# Patient Record
Sex: Female | Born: 1935 | Race: White | Hispanic: No | Smoking: Never smoker
Health system: Southern US, Community
[De-identification: ages and names within clinical notes are randomized; demographics above are authoritative.]

## PROBLEM LIST (undated history)

## (undated) DIAGNOSIS — M1990 Unspecified osteoarthritis, unspecified site: Secondary | ICD-10-CM

## (undated) DIAGNOSIS — E785 Hyperlipidemia, unspecified: Secondary | ICD-10-CM

## (undated) DIAGNOSIS — C801 Malignant (primary) neoplasm, unspecified: Secondary | ICD-10-CM

## (undated) DIAGNOSIS — I10 Essential (primary) hypertension: Secondary | ICD-10-CM

## (undated) HISTORY — PX: COLON SURGERY: SHX602

## (undated) HISTORY — DX: Hyperlipidemia, unspecified: E78.5

## (undated) HISTORY — PX: TOTAL HIP ARTHROPLASTY: SHX124

## (undated) HISTORY — DX: Essential (primary) hypertension: I10

## (undated) HISTORY — PX: EYE SURGERY: SHX253

---

## 1999-11-29 ENCOUNTER — Ambulatory Visit (HOSPITAL_BASED_OUTPATIENT_CLINIC_OR_DEPARTMENT_OTHER): Admission: RE | Admit: 1999-11-29 | Discharge: 1999-11-29 | Payer: Self-pay | Admitting: Plastic Surgery

## 2001-03-25 ENCOUNTER — Other Ambulatory Visit: Admission: RE | Admit: 2001-03-25 | Discharge: 2001-03-25 | Payer: Self-pay | Admitting: Family Medicine

## 2001-12-27 ENCOUNTER — Ambulatory Visit (HOSPITAL_COMMUNITY): Admission: RE | Admit: 2001-12-27 | Discharge: 2001-12-27 | Payer: Self-pay | Admitting: Gastroenterology

## 2004-09-26 ENCOUNTER — Other Ambulatory Visit: Admission: RE | Admit: 2004-09-26 | Discharge: 2004-09-26 | Payer: Self-pay | Admitting: Family Medicine

## 2010-05-07 ENCOUNTER — Other Ambulatory Visit: Payer: Self-pay | Admitting: Orthopedic Surgery

## 2010-05-07 ENCOUNTER — Other Ambulatory Visit (HOSPITAL_COMMUNITY): Payer: Self-pay | Admitting: Orthopedic Surgery

## 2010-05-07 ENCOUNTER — Encounter (HOSPITAL_COMMUNITY): Payer: Medicare Other

## 2010-05-07 ENCOUNTER — Ambulatory Visit (HOSPITAL_COMMUNITY)
Admission: RE | Admit: 2010-05-07 | Discharge: 2010-05-07 | Disposition: A | Discharge status: Home / Self Care | Payer: Medicare Other | Source: Ambulatory Visit | Attending: Orthopedic Surgery | Admitting: Orthopedic Surgery

## 2010-05-07 DIAGNOSIS — Z01810 Encounter for preprocedural cardiovascular examination: Secondary | ICD-10-CM | POA: Insufficient documentation

## 2010-05-07 DIAGNOSIS — Z01812 Encounter for preprocedural laboratory examination: Secondary | ICD-10-CM | POA: Insufficient documentation

## 2010-05-07 DIAGNOSIS — Z01811 Encounter for preprocedural respiratory examination: Secondary | ICD-10-CM

## 2010-05-07 DIAGNOSIS — Z01818 Encounter for other preprocedural examination: Secondary | ICD-10-CM

## 2010-05-07 DIAGNOSIS — M1610 Unilateral primary osteoarthritis, unspecified hip: Secondary | ICD-10-CM | POA: Insufficient documentation

## 2010-05-07 DIAGNOSIS — M169 Osteoarthritis of hip, unspecified: Secondary | ICD-10-CM | POA: Insufficient documentation

## 2010-05-07 LAB — URINALYSIS, ROUTINE W REFLEX MICROSCOPIC
Bilirubin Urine: NEGATIVE
Hgb urine dipstick: NEGATIVE
Specific Gravity, Urine: 1.02 (ref 1.005–1.030)
Urine Glucose, Fasting: NEGATIVE mg/dL
Urobilinogen, UA: 0.2 mg/dL (ref 0.0–1.0)
pH: 5 (ref 5.0–8.0)

## 2010-05-07 LAB — COMPREHENSIVE METABOLIC PANEL
ALT: 13 U/L (ref 0–35)
AST: 20 U/L (ref 0–37)
Albumin: 3.9 g/dL (ref 3.5–5.2)
Alkaline Phosphatase: 59 U/L (ref 39–117)
GFR calc Af Amer: >60 mL/min (ref >60)
Potassium: 4.4 mEq/L (ref 3.5–5.1)
Sodium: 140 mEq/L (ref 135–145)
Total Protein: 7.6 g/dL (ref 6.0–8.3)

## 2010-05-07 LAB — SURGICAL PCR SCREEN
MRSA, PCR: NEGATIVE
Staphylococcus aureus: POSITIVE — AB

## 2010-05-07 LAB — CBC
HCT: 39 % (ref 36.0–46.0)
MCHC: 31.8 g/dL (ref 30.0–36.0)
RDW: 12.3 % (ref 11.5–15.5)

## 2010-05-07 LAB — URINE MICROSCOPIC-ADD ON

## 2010-05-07 LAB — PROTIME-INR: INR: 0.95 (ref 0.00–1.49)

## 2010-05-15 ENCOUNTER — Inpatient Hospital Stay (HOSPITAL_COMMUNITY): Payer: Medicare Other

## 2010-05-15 ENCOUNTER — Inpatient Hospital Stay (HOSPITAL_COMMUNITY)
Admission: RE | Admit: 2010-05-15 | Discharge: 2010-05-18 | DRG: 470 | Disposition: A | Discharge status: Skilled Nursing Facility | Payer: Medicare Other | Source: Ambulatory Visit | Attending: Orthopedic Surgery | Admitting: Orthopedic Surgery

## 2010-05-15 DIAGNOSIS — M169 Osteoarthritis of hip, unspecified: Principal | ICD-10-CM | POA: Diagnosis present

## 2010-05-15 DIAGNOSIS — I10 Essential (primary) hypertension: Secondary | ICD-10-CM | POA: Diagnosis present

## 2010-05-15 DIAGNOSIS — Z79899 Other long term (current) drug therapy: Secondary | ICD-10-CM

## 2010-05-15 DIAGNOSIS — D62 Acute posthemorrhagic anemia: Secondary | ICD-10-CM | POA: Diagnosis not present

## 2010-05-15 DIAGNOSIS — M1610 Unilateral primary osteoarthritis, unspecified hip: Principal | ICD-10-CM | POA: Diagnosis present

## 2010-05-15 LAB — TYPE AND SCREEN
ABO/RH(D): A POS
Antibody Screen: NEGATIVE

## 2010-05-15 LAB — ABO/RH: ABO/RH(D): A POS

## 2010-05-16 LAB — CBC
MCH: 28.8 pg (ref 26.0–34.0)
MCHC: 31.6 g/dL (ref 30.0–36.0)
Platelets: 230 10*3/uL (ref 150–400)
RDW: 12.5 % (ref 11.5–15.5)

## 2010-05-16 LAB — BASIC METABOLIC PANEL
BUN: 9 mg/dL (ref 6–23)
Calcium: 8.7 mg/dL (ref 8.4–10.5)
GFR calc non Af Amer: >60 mL/min (ref >60)
Glucose, Bld: 134 mg/dL — ABNORMAL HIGH (ref 70–99)

## 2010-05-16 LAB — PROTIME-INR: Prothrombin Time: 15.2 seconds (ref 11.6–15.2)

## 2010-05-16 NOTE — H&P (Addendum)
[name], [name]              ACCOUNT NO.:  [id number]  MEDICAL RECORD NO.:  [id number]           PATIENT TYPE:  I  LOCATION:  1540                         FACILITY:  [facility]  PHYSICIAN:  Ollen Gross, M.D.    DATE OF BIRTH:  Mar 20, 1924  DATE OF ADMISSION:  05/15/2010 DATE OF DISCHARGE:  05/07/2010                             HISTORY & PHYSICAL   CHIEF COMPLAINT:  Right hip pain.  BRIEF HISTORY:  [name] has been followed by Dr. Lequita Halt for worsening pain in her right hip.  She was most recently seen in December and unfortunately at that time her pain and dysfunction seemed to be worsening.  The hip pain and dysfunction continued to limit her.  She now presents for a right total hip arthroplasty.  MEDICATION ALLERGIES:  She has sensitivity to ERYTHROMYCIN, this causes nausea and vomiting.  PRIMARY CARE PHYSICIAN:  C. Duane Lope, MD, and she has been cleared for surgery by him.  CURRENT MEDICATIONS: 1. Benicar. 2. Lipitor.  1. Glucosamine. 2. Centrum Silver. 3. Calcium plus vitamin D.  At the time of the History and Physical, she is on antibiotics from her tooth extraction, but it will be finished before her surgery.  PAST MEDICAL HISTORY: 1. End-stage arthritis of the right hip. 2. Hypertension. 3. Arthritis.  PAST SURGICAL HISTORY:  None.  FAMILY HISTORY:  Father passed at the age of 69, natural causes.  Mother passed at age of 1, natural causes.  SOCIAL HISTORY:  The patient is married.  She denies past or present use of tobacco products.  She has 2 children.  She lives alone.  She would like to go to skilled nursing facility following her hospital stay.  She thinks that she most likely would go to [facility].  We will need to arrange a social work consult to arrange everything for her.  REVIEW OF SYSTEMS:  GENERAL:  Negative for fevers, chills, or weight change.  HEENT/NEUROLOGIC:  Negative for headache or blurred vision. DERMATOLOGIC:  Positive for  occasional rashes, none currently. RESPIRATORY:  Negative for shortness of breath at rest or with exertion. CARDIOVASCULAR:  Negative for chest pain or palpitations. GASTROINTESTINAL:  Negative for nausea, vomiting, or diarrhea. GENITOURINARY:  Negative for hematuria, dysuria.  MUSCULOSKELETAL: Positive for joint pain and morning stiffness.  PHYSICAL EXAMINATION:  VITAL SIGNS:  Pulse 80, respirations 18, blood pressure 122/76 in the right arm. GENERAL:  [name] is alert and oriented x3.  She is well developed, well nourished in no apparent distress.  She is a pleasant 77 year old female.  She is at stated height of 5 feet 4 inches and a stated weight of 139 pounds. HEENT:  Normocephalic, atraumatic.  Extraocular movements intact. NECK:  Supple, full range of motion without lymphadenopathy. CHEST:  Lungs are clear to auscultation bilaterally without wheezes, rhonchi, or rales. HEART:  Regular rate and rhythm. ABDOMEN:  Bowel sounds present in all 4 quadrants. EXTREMITIES:  Evaluation of the right hip, it is able to be flexed to about 100 degrees, minimal internal rotation, 20 degrees of external rotation, and only 20-30 degrees of abduction. SKIN:  Unremarkable. NEUROLOGIC:  Intact. PERIPHERAL VASCULAR:  Carotid pulses 2+ bilaterally without bruit.  DIAGNOSTIC STUDIES:  Radiographs of the right hip reveal advanced end- stage arthritis.  She has bone-on-bone changes.  IMPRESSION:  End-stage arthritis of the right hip.  PLAN:  Right total hip arthroplasty to be performed by Dr. Lequita Halt.     Rozell Searing, PAC   ______________________________ Ollen Gross, M.D.    LD/MEDQ  D:  05/15/2010  T:  05/15/2010  Job:  [id number]  Electronically Signed by Rozell Searing  on 05/16/2010 07:38:49 AM Electronically Signed by Ollen Gross M.D. on 05/29/2010 08:17:08 AM

## 2010-05-17 LAB — BASIC METABOLIC PANEL
Calcium: 8.6 mg/dL (ref 8.4–10.5)
Creatinine, Ser: 0.65 mg/dL (ref 0.4–1.2)
GFR calc Af Amer: >60 mL/min (ref >60)
GFR calc non Af Amer: >60 mL/min (ref >60)
Glucose, Bld: 156 mg/dL — ABNORMAL HIGH (ref 70–99)
Sodium: 135 mEq/L (ref 135–145)

## 2010-05-17 LAB — CBC
MCH: 29.5 pg (ref 26.0–34.0)
MCHC: 32.3 g/dL (ref 30.0–36.0)
RDW: 12.5 % (ref 11.5–15.5)

## 2010-05-17 LAB — PROTIME-INR: INR: 1.31 (ref 0.00–1.49)

## 2010-05-18 LAB — CBC
MCHC: 32.1 g/dL (ref 30.0–36.0)
MCV: 90 fL (ref 78.0–100.0)
Platelets: 203 10*3/uL (ref 150–400)
RDW: 12.4 % (ref 11.5–15.5)
WBC: 8.5 10*3/uL (ref 4.0–10.5)

## 2010-05-29 NOTE — Discharge Summary (Signed)
NAMESAMARIA, ANES              ACCOUNT NO.:  [id number]  MEDICAL RECORD NO.:  [id number]           PATIENT TYPE:  I  LOCATION:  1540                         FACILITY:  [facility]  PHYSICIAN:  Ollen Gross, M.D.    DATE OF BIRTH:  31-Oct-1924  DATE OF ADMISSION:  05/15/2010 DATE OF DISCHARGE:                        DISCHARGE SUMMARY - REFERRING   TENTATIVE DATE OF DISCHARGE:  Tomorrow, May 18, 2010.  ADMITTING DIAGNOSES: 1. Osteoarthritis right hip. 2. Hypertension.  DISCHARGE DIAGNOSES: 1. Osteoarthritis right hip status post right total hip replacement     arthroplasty. 2. Postoperative acute blood loss anemia, did not require transfusion. 3. Hypertension.  PROCEDURE:  May 15, 2010 right total hip.  Surgeon; Dr. Lequita Halt. Assistant; Avel Peace PA-C.  Anesthesia; general.  Blood loss approximately 200 cc.  CONSULTS:  None.  BRIEF HISTORY:  [name] is an 77 year old female who has been seen by Dr. Lequita Halt for ongoing progressive right hip pain.  She has known end- stage arthritis, thought to be a good candidate for surgical intervention.  Risks, benefits have been discussed.  She elected to proceed with surgery.  LABORATORY DATA:  Preop CBC showed hemoglobin 12.4, hematocrit 39.0, white cell count 8.4, platelets 314.  PT/INR 12.9 and 0.9, however, the PTT of 28.  Preop Chem panel on admission all within normal limits. Preop UA did show some small leukocyte esterase, rare squamous, 3 to 6 white cells, rare bacteria, otherwise negative.  Blood group type A+. Nasal swabs were positive for staph aureus, but negative for MRSA. Serial CBCs were followed.  Hemoglobin dropped down to 9.8 on postop day #1.  On postop day #2, her hemoglobin and hematocrit was 9.2 and 28.5. One more hemoglobin and hematocrit scheduled for postop day #3.  Serial pro-times followed per Coumadin protocol.  INR was 1.18 on postop day #1 and 1.31 on postop day #2.  PT and INR pending on date of  discharge. Serial BMETs were followed for 48 hours.  Electrolytes remained all within normal limits.  X-RAYS:  Portable pelvis and right hip films on postop shows right total hip replacement without complication.  EKG dated May 07, 2010 normal sinus rhythm, normal EKG confirmed by Dr. Hillis Range.  HOSPITAL COURSE:  The patient was admitted to the [facility], taken to the OR, underwent the said procedure without complication.  The patient tolerated the procedure well, later transferred to the recovery room in orthopedic floor, started on p.o. and IV analgesic pain control following surgery.  Actually doing pretty well in the morning of day #1. Hemoglobin is down to 9.8.  She is asymptomatic with this.  We did put her on iron supplementation.  Hemovac drain placed on the surgery, pulled without difficulty.  Started getting up out of bed, doing some ambulation with therapy.  She is allowed to be partial weightbearing 25% to 50% to the right leg.  She is walking a few steps.  She did want to look into skilled nurse facility [facility], so we get social work involved.  By day #2, she was doing better.  Hemoglobin was down little bit to 9.2, but she  is asymptomatic with this.  She continued to work with physical therapy at that point as long as there was a bed available.  We were planning on transferring her over to [facility] on the next day on Saturday.  We will get social work involved in making arrangements for paperwork and as long as she was doing well, medically stable.  When the bed is available, she would be transferred out tomorrow on May 18, 2010.  DISCHARGE/PLAN: 1. Tentative plan for transfer tomorrow, May 18, 2010. 2. Discharge diagnoses, please see above. 3. Discharge medications.  CURRENT MEDICATIONS:  At the time of transfer include: 1. Coumadin protocol.  Please titrate the Coumadin level for a target     INR between 2.0 and 3.0.  She needs to be  on Coumadin for 3 weeks     from the date of surgery. 2. Colace 100 mg p.o. b.i.d. 3. Lipitor 10 mg p.o. daily 4. Aspirin 81 mg p.o. daily. 5. Norvasc 5 mg p.o. daily. 6. Benicar HCT 40/25 mg p.o. q.a.m. 7. Nu-Iron 150 mg p.o. daily for 3 weeks then discontinue the Nu-Iron. 8. Tylenol 325 mg one or two every 4 to 6 hours as needed for mild     pain, temperature, or headache. 9. Robaxin 500 mg p.o. q.6-8 h. p.r.n. spasm. 10.Laxative of choice. 11.Enema of choice. 12.Percocet 5 mg one or two every 4 to 6 hours as needed for moderate     pain.  DIET:  Heart-healthy diet.  ACTIVITY:  She is partial weightbearing 25% to 50% to the right lower extremity, hip precautions, total hip protocol.  PT and OT for gait training, ambulation, ADLs, range of motion, and strengthening exercises.  She may start showering, however, do not submerge incision under water.  FOLLOWUP:  She needs to follow up in 2 weeks following her surgery. Please contact the office at [phone] and arrange appointment and followup care.  DISPOSITION:  Planning for [facility] on Saturday.  CONDITION ON DISCHARGE:  Improving at the time of dictation, will be transferred on Saturday May 18, 2010 and while she is doing well.     Alexzandrew L. Julien Girt, P.A.C.   ______________________________ Ollen Gross, M.D.    ALP/MEDQ  D:  05/17/2010  T:  05/17/2010  Job:  [id number]  cc:   Dorma Russell He already retired in 2004  Electronically Signed by Patrica Duel P.A.C. on 05/21/2010 10:25:55 AM Electronically Signed by Ollen Gross M.D. on 05/29/2010 08:17:03 AM

## 2010-05-29 NOTE — Op Note (Signed)
NAME:  [name]              ACCOUNT NO.:  [id number]  MEDICAL RECORD NO.:  [id number]           PATIENT TYPE:  I  LOCATION:  0008                         FACILITY:  [facility]  PHYSICIAN:  Ollen Gross, M.D.    DATE OF BIRTH:  [birth year]  DATE OF PROCEDURE:  05/15/2010 DATE OF DISCHARGE:                              OPERATIVE REPORT   PREOPERATIVE DIAGNOSIS:  Osteoarthritis, right hip.  POSTOPERATIVE DIAGNOSIS:  Osteoarthritis, right hip.  PROCEDURE:  Right total hip arthroplasty.  SURGEON:  Ollen Gross, MD  ASSISTANT:  Alexzandrew L. Julien Girt, PA-C  ANESTHESIA:  General.  ESTIMATED BLOOD LOSS:  200.Marland Kitchen  DRAIN:  Hemovac x1.  COMPLICATIONS:  None.  CONDITION:  Stable to Recovery.  BRIEF CLINICAL NOTE:  [name] is an 77 year old female with advanced end-stage arthritis of the right hip with progressively worsening pain and dysfunction.  She has failed nonoperative management and presents for total hip arthroplasty.  PROCEDURE IN DETAIL:  After successful administration of general anesthetic, the patient was placed in left lateral decubitus position with the right side up and held with a hip positioner.  Right lower extremity was isolated from its perineum with plastic drapes and prepped and draped in a usual sterile fashion.  Short posterolateral incision was made with a 10 blade through subcutaneous tissue to the level of fascia lata which was incised in line with the skin incision.  The sciatic nerve was palpated and protected and short rotators and capsule were isolated off the femur.  Hip was dislocated and the center of femoral head was marked.  The trial prosthesis was placed such at that the center of the trial head corresponds to the center of the native femoral head.  Osteotomy line was marked on the femoral neck and osteotomy made with an oscillating saw.  Femoral head was removed. Femoral retractors were placed to gain access to the canal.  Starter  reamer was passed into the femoral canal.  The canal was then thoroughly irrigated to remove fatty contents.  The axial reaming was then performed to 13.5 mm, proximal reaming to an 65F and the sleeve machined to a small.  An 65F small trial sleeve was then placed.  The femur was retracted anteriorly to gain acetabular exposure. Acetabular retractors were then placed.  The acetabular labrum and osteophytes were removed.  Acetabular reaming was performed up to 51 mm for placement of a 52-mm Pinnacle acetabular shell.  The shell was placed in anatomic position and transfixed with 2 dome screws.  The apex hole eliminator was placed and then the permanent 32 mm neutral +4 Marathon liner was placed.  The trial stem was placed which was an 18 x 13 with a 36 +8 neck matching native anteversion.  The 32 +0 femoral head was placed.  It reduced so easily, so we went to a +2 which had more appropriate soft- tissue tension.  Stability was fantastic with full extension, full external rotation, 70 degrees flexion, 40 degrees adduction, 90 degrees internal rotation, 90 degrees of flexion, and 70 degrees of internal rotation.  By placing the right leg on top of the left,  it was felt as though leg lengths were equal.  Hip was then dislocated and trials were removed.  The permanent 44F small sleeve was placed and the permanent 18 x 13 stem with a 36 +8 neck matching native anteversion was placed.  The 32 +3 head was placed and then the hip was reduced with the same stability parameters.  The wound was then copiously irrigated with saline solution and short rotators and capsule reattached to the femur through drill holes with Ethibond suture.  Fascia lata was closed over Hemovac drain with interrupted #1 Vicryl, subcutaneous closed with #1-0 and #2-0 Vicryl, and then subcuticular running 4-0 Monocryl.  The catheter for Marcaine pain pump was placed and the pump was initiated. Incision was cleaned and  dried and Steri-Strips and a bulky sterile dressing were applied.  She was then placed into a knee immobilizer, awakened, and transported to Recovery in stable condition.     Ollen Gross, M.D.     FA/MEDQ  D:  05/15/2010  T:  05/15/2010  Job:  [id number]  Electronically Signed by Ollen Gross M.D. on 05/29/2010 08:17:06 AM

## 2013-02-23 ENCOUNTER — Encounter: Payer: Self-pay | Admitting: Podiatry

## 2013-02-23 ENCOUNTER — Ambulatory Visit (INDEPENDENT_AMBULATORY_CARE_PROVIDER_SITE_OTHER): Payer: Medicare Other

## 2013-02-23 ENCOUNTER — Ambulatory Visit (INDEPENDENT_AMBULATORY_CARE_PROVIDER_SITE_OTHER): Payer: Medicare Other | Admitting: Podiatry

## 2013-02-23 VITALS — BP 156/76 | HR 92 | Resp 16 | Ht 64.0 in | Wt 149.0 lb

## 2013-02-23 DIAGNOSIS — M722 Plantar fascial fibromatosis: Secondary | ICD-10-CM

## 2013-02-23 MED ORDER — TRIAMCINOLONE ACETONIDE 10 MG/ML IJ SUSP
10.0000 mg | Freq: Once | INTRAMUSCULAR | Status: AC
  Administered 2013-02-23: 10 mg

## 2013-02-23 NOTE — Patient Instructions (Signed)

## 2013-02-23 NOTE — Progress Notes (Signed)
   Subjective:    Patient ID: [name], female    DOB: 03-22-1924, 79 y.o.   MRN: [id number]  HPI Comments: "My heel hurts"  Pt c/o of plantar/medial heel pain left for several mos. Feels like a stone bruise. No AM pain just mostly after walking a lot or standing. She has not tried any self treatment other than rest.     Review of Systems  All other systems reviewed and are negative.       Objective:   Physical Exam        Assessment & Plan:

## 2013-02-24 NOTE — Progress Notes (Signed)
Subjective:     Patient ID: [name], female   DOB: 25-Dec-1924, 79 y.o.   MRN: [id number]  Foot Pain   patient presents stating I am having a lot of pain in my left heel of several months duration. I've tried reduced activity and shoe modifications without relief   Review of Systems  All other systems reviewed and are negative.       Objective:   Physical Exam  Constitutional: She is oriented to person, place, and time.  Cardiovascular: Intact distal pulses.   Musculoskeletal: Normal range of motion.  Neurological: She is oriented to person, place, and time.  Skin: Skin is warm.   neurovascular status intact with muscle strength adequate and no equinus condition noted both feet. Patient is found to have pain to palpation with edema of the left plantar fascia at the insertion of the tendon into the calcaneus     Assessment:     Plantar fasciitis left with inflammation and fluid buildup    Plan:     H&P performed and today I injected the left plantar fascia 3 mg Kenalog 5 of Xylocaine Marcaine mixture dispensed fascial brace and instructed on physical therapy

## 2013-03-09 ENCOUNTER — Ambulatory Visit (INDEPENDENT_AMBULATORY_CARE_PROVIDER_SITE_OTHER): Payer: Medicare Other | Admitting: Podiatry

## 2013-03-09 ENCOUNTER — Encounter: Payer: Self-pay | Admitting: Podiatry

## 2013-03-09 VITALS — BP 128/72 | HR 92 | Resp 16

## 2013-03-09 DIAGNOSIS — M722 Plantar fascial fibromatosis: Secondary | ICD-10-CM

## 2013-03-09 MED ORDER — TRIAMCINOLONE ACETONIDE 10 MG/ML IJ SUSP
10.0000 mg | Freq: Once | INTRAMUSCULAR | Status: AC
  Administered 2013-03-09: 10 mg

## 2013-03-11 NOTE — Progress Notes (Signed)
Subjective:     Patient ID: [name], female   DOB: 07-Jan-1925, 80 y.o.   MRN: [id number]  HPI patient states I'm feeling quite a bit better but still having pain in the bottom of my left heel with ambulation   Review of Systems     Objective:   Physical Exam Neurovascular status intact with no health history changes noted and discomfort still noted upon deep palpation plantar heel left    Assessment:     Plantar fasciitis improving left heel with pain still present    Plan:     Instructed on continued physical therapy reduced activity and fascial brace usage. Reinjected the plantar fascia 3 mg Kenalog 5 mg Xylocaine Marcaine mixture at this time and reappoint as needed

## 2013-07-06 ENCOUNTER — Emergency Department (HOSPITAL_COMMUNITY): Payer: Medicare Other

## 2013-07-06 ENCOUNTER — Encounter (HOSPITAL_COMMUNITY): Payer: Self-pay | Admitting: Emergency Medicine

## 2013-07-06 ENCOUNTER — Inpatient Hospital Stay (HOSPITAL_COMMUNITY)
Admission: EM | Admit: 2013-07-06 | Discharge: 2013-07-15 | DRG: 330 | Disposition: A | Discharge status: Home Health | Payer: Medicare Other | Attending: Internal Medicine | Admitting: Internal Medicine

## 2013-07-06 DIAGNOSIS — R110 Nausea: Secondary | ICD-10-CM | POA: Diagnosis present

## 2013-07-06 DIAGNOSIS — E46 Unspecified protein-calorie malnutrition: Secondary | ICD-10-CM | POA: Diagnosis present

## 2013-07-06 DIAGNOSIS — E785 Hyperlipidemia, unspecified: Secondary | ICD-10-CM | POA: Diagnosis present

## 2013-07-06 DIAGNOSIS — R112 Nausea with vomiting, unspecified: Secondary | ICD-10-CM

## 2013-07-06 DIAGNOSIS — C182 Malignant neoplasm of ascending colon: Secondary | ICD-10-CM | POA: Diagnosis present

## 2013-07-06 DIAGNOSIS — K6389 Other specified diseases of intestine: Secondary | ICD-10-CM

## 2013-07-06 DIAGNOSIS — I10 Essential (primary) hypertension: Secondary | ICD-10-CM | POA: Diagnosis present

## 2013-07-06 DIAGNOSIS — Z79899 Other long term (current) drug therapy: Secondary | ICD-10-CM

## 2013-07-06 DIAGNOSIS — K56609 Unspecified intestinal obstruction, unspecified as to partial versus complete obstruction: Secondary | ICD-10-CM

## 2013-07-06 DIAGNOSIS — Z932 Ileostomy status: Secondary | ICD-10-CM

## 2013-07-06 DIAGNOSIS — C189 Malignant neoplasm of colon, unspecified: Principal | ICD-10-CM | POA: Diagnosis present

## 2013-07-06 DIAGNOSIS — Z96649 Presence of unspecified artificial hip joint: Secondary | ICD-10-CM

## 2013-07-06 DIAGNOSIS — Z7982 Long term (current) use of aspirin: Secondary | ICD-10-CM

## 2013-07-06 DIAGNOSIS — Z6825 Body mass index (BMI) 25.0-25.9, adult: Secondary | ICD-10-CM

## 2013-07-06 LAB — URINALYSIS, ROUTINE W REFLEX MICROSCOPIC
Bilirubin Urine: NEGATIVE
Glucose, UA: NEGATIVE mg/dL
Hgb urine dipstick: NEGATIVE
Ketones, ur: NEGATIVE mg/dL
Nitrite: NEGATIVE
Protein, ur: NEGATIVE mg/dL
Specific Gravity, Urine: 1.022 (ref 1.005–1.030)
Urobilinogen, UA: 0.2 mg/dL (ref 0.0–1.0)
pH: 6 (ref 5.0–8.0)

## 2013-07-06 LAB — COMPREHENSIVE METABOLIC PANEL
ALK PHOS: 71 U/L (ref 39–117)
ALT: 11 U/L (ref 0–35)
AST: 16 U/L (ref 0–37)
Albumin: 3.6 g/dL (ref 3.5–5.2)
BILIRUBIN TOTAL: 0.3 mg/dL (ref 0.3–1.2)
BUN: 19 mg/dL (ref 6–23)
CHLORIDE: 96 meq/L (ref 96–112)
CO2: 25 meq/L (ref 19–32)
Calcium: 10.1 mg/dL (ref 8.4–10.5)
Creatinine, Ser: 0.85 mg/dL (ref 0.50–1.10)
GFR calc non Af Amer: 60 mL/min — ABNORMAL LOW (ref >90)
GFR, EST AFRICAN AMERICAN: 69 mL/min — AB (ref >90)
GLUCOSE: 141 mg/dL — AB (ref 70–99)
POTASSIUM: 3.4 meq/L — AB (ref 3.7–5.3)
Sodium: 136 mEq/L — ABNORMAL LOW (ref 137–147)
Total Protein: 7.4 g/dL (ref 6.0–8.3)

## 2013-07-06 LAB — CBC WITH DIFFERENTIAL/PLATELET
Basophils Absolute: 0 K/uL (ref 0.0–0.1)
Basophils Relative: 0 % (ref 0–1)
Eosinophils Absolute: 0.1 K/uL (ref 0.0–0.7)
Eosinophils Relative: 1 % (ref 0–5)
HCT: 37.1 % (ref 36.0–46.0)
Hemoglobin: 12.2 g/dL (ref 12.0–15.0)
Lymphocytes Relative: 13 % (ref 12–46)
Lymphs Abs: 1.4 K/uL (ref 0.7–4.0)
MCH: 26 pg (ref 26.0–34.0)
MCHC: 32.9 g/dL (ref 30.0–36.0)
MCV: 79.1 fL (ref 78.0–100.0)
Monocytes Absolute: 0.6 K/uL (ref 0.1–1.0)
Monocytes Relative: 6 % (ref 3–12)
Neutro Abs: 8.4 K/uL — ABNORMAL HIGH (ref 1.7–7.7)
Neutrophils Relative %: 80 % — ABNORMAL HIGH (ref 43–77)
Platelets: 318 K/uL (ref 150–400)
RBC: 4.69 MIL/uL (ref 3.87–5.11)
RDW: 15.5 % (ref 11.5–15.5)
WBC: 10.5 K/uL (ref 4.0–10.5)

## 2013-07-06 LAB — CREATININE, SERUM
Creatinine, Ser: 0.82 mg/dL (ref 0.50–1.10)
GFR calc Af Amer: 72 mL/min — ABNORMAL LOW (ref >90)
GFR calc non Af Amer: 63 mL/min — ABNORMAL LOW (ref >90)

## 2013-07-06 LAB — CBC
HCT: 35.5 % — ABNORMAL LOW (ref 36.0–46.0)
HEMOGLOBIN: 11.7 g/dL — AB (ref 12.0–15.0)
MCH: 26.2 pg (ref 26.0–34.0)
MCHC: 33 g/dL (ref 30.0–36.0)
MCV: 79.6 fL (ref 78.0–100.0)
PLATELETS: 323 10*3/uL (ref 150–400)
RBC: 4.46 MIL/uL (ref 3.87–5.11)
RDW: 15.6 % — ABNORMAL HIGH (ref 11.5–15.5)
WBC: 9.6 10*3/uL (ref 4.0–10.5)

## 2013-07-06 LAB — POC OCCULT BLOOD, ED: Fecal Occult Bld: NEGATIVE

## 2013-07-06 LAB — URINE MICROSCOPIC-ADD ON

## 2013-07-06 LAB — MAGNESIUM: MAGNESIUM: 1.9 mg/dL (ref 1.5–2.5)

## 2013-07-06 LAB — LIPASE, BLOOD: Lipase: 17 U/L (ref 11–59)

## 2013-07-06 LAB — PHOSPHORUS: Phosphorus: 4.1 mg/dL (ref 2.3–4.6)

## 2013-07-06 MED ORDER — ONDANSETRON HCL 4 MG PO TABS: 4.0000 mg | ORAL_TABLET | Freq: Four times a day (QID) | ORAL | Status: DC | PRN

## 2013-07-06 MED ORDER — LIDOCAINE VISCOUS 2 % MT SOLN
15.0000 mL | Freq: Once | OROMUCOSAL | Status: AC
  Administered 2013-07-06: 15 mL via OROMUCOSAL
  Filled 2013-07-06: qty 15

## 2013-07-06 MED ORDER — IOHEXOL 300 MG/ML  SOLN
50.0000 mL | Freq: Once | INTRAMUSCULAR | Status: AC | PRN
  Administered 2013-07-06: 50 mL via ORAL

## 2013-07-06 MED ORDER — KCL IN DEXTROSE-NACL 20-5-0.45 MEQ/L-%-% IV SOLN
INTRAVENOUS | Status: DC
  Administered 2013-07-06 – 2013-07-11 (×7): via INTRAVENOUS
  Filled 2013-07-06 (×15): qty 1000

## 2013-07-06 MED ORDER — IOHEXOL 300 MG/ML  SOLN
100.0000 mL | Freq: Once | INTRAMUSCULAR | Status: AC | PRN
  Administered 2013-07-06: 100 mL via INTRAVENOUS

## 2013-07-06 MED ORDER — HEPARIN SODIUM (PORCINE) 5000 UNIT/ML IJ SOLN
5000.0000 [IU] | Freq: Three times a day (TID) | INTRAMUSCULAR | Status: DC
  Administered 2013-07-06 – 2013-07-08 (×6): 5000 [IU] via SUBCUTANEOUS
  Filled 2013-07-06 (×8): qty 1

## 2013-07-06 MED ORDER — ONDANSETRON HCL 4 MG/2ML IJ SOLN
4.0000 mg | Freq: Four times a day (QID) | INTRAMUSCULAR | Status: DC | PRN
  Administered 2013-07-06: 4 mg via INTRAVENOUS
  Filled 2013-07-06 (×3): qty 2

## 2013-07-06 MED ORDER — ONDANSETRON HCL 4 MG/2ML IJ SOLN
4.0000 mg | Freq: Once | INTRAMUSCULAR | Status: AC
  Administered 2013-07-06: 4 mg via INTRAVENOUS
  Filled 2013-07-06: qty 2

## 2013-07-06 MED ORDER — SODIUM CHLORIDE 0.9 % IV SOLN
INTRAVENOUS | Status: DC
Start: 2013-07-06 — End: 2013-07-06

## 2013-07-06 MED ORDER — MORPHINE SULFATE 2 MG/ML IJ SOLN
1.0000 mg | INTRAMUSCULAR | Status: DC | PRN
  Administered 2013-07-06 – 2013-07-07 (×4): 1 mg via INTRAVENOUS
  Filled 2013-07-06 (×4): qty 1

## 2013-07-06 MED ORDER — SODIUM CHLORIDE 0.9 % IV SOLN
INTRAVENOUS | Status: DC
  Administered 2013-07-06: 12:00:00 via INTRAVENOUS

## 2013-07-06 MED ORDER — MENTHOL 3 MG MT LOZG
1.0000 | LOZENGE | OROMUCOSAL | Status: DC | PRN
  Filled 2013-07-06: qty 9

## 2013-07-06 MED ORDER — SODIUM CHLORIDE 0.9 % IV BOLUS (SEPSIS)
1000.0000 mL | Freq: Once | INTRAVENOUS | Status: AC
  Administered 2013-07-06: 1000 mL via INTRAVENOUS

## 2013-07-06 NOTE — Consult Note (Signed)
Subjective:   HPI  The patient is an 80 year old female who has been experiencing generalized abdominal cramping discomfort over the past 2 or 3 weeks. She has had some loose stool but denies rectal bleeding. She came to the emergency room with complaints of abdominal pain and a CT scan of the abdomen was done which shows thickening of the bowel wall at the hepatic flexure of the colon worrisome for malignancy and this is causing bowel obstruction. An NG tube has been placed. We are asked to see her in regards to colonoscopy.  Review of Systems No chest pain or shortness of breath  Past Medical History  Diagnosis Date  . Hypertension   . Hyperlipidemia    Past Surgical History  Procedure Laterality Date  . Total hip arthroplasty     History   Social History  . Marital Status: Married    Spouse Name: N/A    Number of Children: N/A  . Years of Education: N/A   Occupational History  . Not on file.   Social History Main Topics  . Smoking status: Never Smoker   . Smokeless tobacco: Not on file  . Alcohol Use: No  . Drug Use: No  . Sexual Activity: Not on file   Other Topics Concern  . Not on file   Social History Narrative  . No narrative on file   family history is not on file. Current facility-administered medications:dextrose 5 % and 0.45 % NaCl with KCl 20 mEq/L infusion, , Intravenous, Continuous, Velvet Bathe, MD, Last Rate: 100 mL/hr at 07/06/13 1235;  heparin injection 5,000 Units, 5,000 Units, Subcutaneous, 3 times per day, Velvet Bathe, MD;  menthol-cetylpyridinium (CEPACOL) lozenge 3 mg, 1 lozenge, Oral, PRN, Velvet Bathe, MD;  morphine 2 MG/ML injection 1 mg, 1 mg, Intravenous, Q3H PRN, Velvet Bathe, MD ondansetron (ZOFRAN) injection 4 mg, 4 mg, Intravenous, Q6H PRN, Velvet Bathe, MD;  ondansetron (ZOFRAN) tablet 4 mg, 4 mg, Oral, Q6H PRN, Velvet Bathe, MD No Known Allergies   Objective:     BP 152/75  Pulse 84  Temp(Src) 98.3 F (36.8 C) (Oral)  Resp 16   Ht 5\' 4"  (1.626 m)  Wt 67.631 kg (149 lb 1.6 oz)  BMI 25.58 kg/m2  SpO2 98%  She is in no distress  Heart regular rhythm no murmurs  Lungs clear  Abdomen: Bowel sounds are present, the abdomen is somewhat distended and tympanitic. It is soft with minimal discomfort to palpation diffusely. No obvious masses are felt.  Laboratory No components found with this basename: d1      Assessment:     #1. Bowel obstruction  #2. Abnormal CT scan with probable mass at the hepatic flexure      Plan:     NG tube suction for bowel obstruction. Reassess the situation tomorrow. We will plan colonoscopy in the next couple of days. We will not be able to prep her from above with an oral prep given the bowel obstruction but will have to try enemas to clean out her colon as best we can. She is going to need surgery for the bowel obstruction at the hepatic flexure. We will look for any synchronous lesions or more distal colon polyps.    Component Value Date/Time   WBC 9.6 07/06/2013 1305   HGB 11.7* 07/06/2013 1305   HCT 35.5* 07/06/2013 1305   PLT 323 07/06/2013 1305   ALT 11 07/06/2013 0806   AST 16 07/06/2013 0806   NA 136* 07/06/2013 0806  K 3.4* 07/06/2013 0806   CL 96 07/06/2013 0806   CREATININE 0.82 07/06/2013 1305   BUN 19 07/06/2013 0806   CO2 25 07/06/2013 0806   CALCIUM 10.1 07/06/2013 0806   PHOS 4.1 07/06/2013 1305   ALKPHOS 71 07/06/2013 0806

## 2013-07-06 NOTE — Care Management Note (Signed)
CARE MANAGEMENT NOTE 07/06/2013  Patient:  [name], [name]   Account Number:  [id number]  Date Initiated:  07/06/2013  Documentation initiated by:  Emil Weigold  Subjective/Objective Assessment:   80 yo female admitted with a colonic mass.     Action/Plan:   Discharged when stable   Anticipated DC Date:     Anticipated DC Plan:  [place]  CM consult      Choice offered to / List presented to:  NA   DME arranged  NA      DME agency  NA     [place] arranged  NA      [place] agency  NA   Status of service:  In process, will continue to follow Medicare Important Message given?   (If response is "NO", the following Medicare IM given date fields will be blank) Date Medicare IM given:   Date Additional Medicare IM given:    Discharge Disposition:    Per UR Regulation:  Reviewed for med. necessity/level of care/duration of stay  If discussed at [place] of Stay Meetings, dates discussed:    Comments:  07/06/13 [place] Chart reviewed for utilization of services. No needs assessed at this time. Will continue to follow.

## 2013-07-06 NOTE — Consult Note (Signed)
General Surgery [facility]  Patient seen and examined.  CT scan reviewed and discussed with patient and family at bedside.  Agree with NG decompression, cleansing enemas from below.  Await GI evaluation.  Patient will need resection to relieve obstruction.  Discussed one stage versus possible ostomy with patient and family.  Discussed timing of surgery - probably Friday (5/1) or Saturday (5/2).  Will follow with you.  Earnstine Regal, MD, [facility] Office: [phone]

## 2013-07-06 NOTE — ED Notes (Signed)
Patient transported to CT 

## 2013-07-06 NOTE — ED Provider Notes (Signed)
CSN: [id number]     Arrival date & time 07/06/13  2951 History   First MD Initiated Contact with Patient 07/06/13 [phone]     Chief Complaint  Patient presents with  . Abdominal Pain  . Nausea     (Consider location/radiation/quality/duration/timing/severity/associated sxs/prior Treatment) Patient is a 80 y.o. female presenting with abdominal pain.  Abdominal Pain  This is an 80 year old female who comes in today complaining of diffuse abdominal pain. She states she has had her symptoms 2-3 weeks. It is a crampy pain that comes and goes. She cannot identify inciting factors or relieving factors. She states that the pain began again last night and was worse through the night. She has had associated vomiting for the past several days. She describes the emesis as brown liquid. She had some diarrhea with the last episode on Saturday. She states she has had very little solid intake since that time. She was seen at [facility] walk in clinic over the weekend and was diagnosed urinary tract infection and started on Cipro which she is still taking. She denies fever, chills, sick contacts, chest pain, urinary tract infection symptoms, or leg pain or swelling. She has not had any recent hospitalizations.   Past Medical History  Diagnosis Date  . Hypertension   . Hyperlipidemia    Past Surgical History  Procedure Laterality Date  . Total hip arthroplasty     No family history on file. History  Substance Use Topics  . Smoking status: Never Smoker   . Smokeless tobacco: Not on file  . Alcohol Use: No   OB History   Grav Para Term Preterm Abortions TAB SAB Ect Mult Living                 Review of Systems  Gastrointestinal: Positive for abdominal pain.      Allergies  Review of patient's allergies indicates no known allergies.  Home Medications   Prior to Admission medications   Medication Sig Start Date End Date Taking? Authorizing Provider  amLODipine (NORVASC) 5 MG tablet  02/01/13    Historical Provider, MD  aspirin 81 MG tablet Take 81 mg by mouth daily.    Historical Provider, MD  atorvastatin (LIPITOR) 10 MG tablet  01/26/13   Historical Provider, MD  losartan-hydrochlorothiazide (HYZAAR) 100-25 MG per tablet  02/16/13   Historical Provider, MD   BP 147/81  Pulse 96  Temp(Src) 97.6 F (36.4 C) (Oral)  Resp 20  Ht 5\' 4"  (1.626 m)  Wt 150 lb (68.04 kg)  BMI 25.73 kg/m2  SpO2 100% Physical Exam  Nursing note and vitals reviewed. Constitutional: She is oriented to person, place, and time. She appears well-developed and well-nourished.  HENT:  Head: Normocephalic and atraumatic.  Right Ear: External ear normal.  Left Ear: External ear normal.  Nose: Nose normal.  Mucous membranes are slightly dry  Eyes: Conjunctivae and EOM are normal. Pupils are equal, round, and reactive to light.  Neck: Normal range of motion. Neck supple. No JVD present. No tracheal deviation present. No thyromegaly present.  Cardiovascular: Normal rate, regular rhythm, normal heart sounds and intact distal pulses.   Pulmonary/Chest: Effort normal and breath sounds normal. No respiratory distress. She has no wheezes.  Abdominal: Soft. Bowel sounds are normal. She exhibits distension. She exhibits no mass. There is tenderness. There is guarding. There is no rebound.  Abdomen reveals guarding on exam with mild diffuse tenderness to palpation and appears slightly distended no rebound or masses are noted  Genitourinary:  Rectal exam performed no masses noted some small amount of stool palpable in the rectal vault.  Musculoskeletal: Normal range of motion.  Lymphadenopathy:    She has no cervical adenopathy.  Neurological: She is alert and oriented to person, place, and time. She has normal reflexes. She displays normal reflexes. No cranial nerve deficit or sensory deficit. She exhibits normal muscle tone. Coordination and gait normal. GCS eye subscore is 4. GCS verbal subscore is 5. GCS motor  subscore is 6.  Reflex Scores:      Bicep reflexes are 2+ on the right side and 2+ on the left side.      Patellar reflexes are 2+ on the right side and 2+ on the left side. Strength is 5/5 bilateral elbow flexor/extensors, wrist extension/flexion, intrinsic hand strength equal Bilateral hip flexion/extension 5/5, knee flexion/extension 5/5, ankle 5/5 flexion extension    Skin: Skin is warm and dry.  Psychiatric: She has a normal mood and affect. Her behavior is normal. Judgment and thought content normal.    ED Course  Procedures (including critical care time) Labs Review Labs Reviewed  CBC WITH DIFFERENTIAL  COMPREHENSIVE METABOLIC PANEL  LIPASE, BLOOD  URINALYSIS, ROUTINE W REFLEX MICROSCOPIC    Imaging Review Ct Abdomen Pelvis W Contrast  07/06/2013   CLINICAL DATA:  Intermittent generalized abdominal pain for 2 weeks.  EXAM: CT ABDOMEN AND PELVIS WITH CONTRAST  TECHNIQUE: Multidetector CT imaging of the abdomen and pelvis was performed using the standard protocol following bolus administration of intravenous contrast.  CONTRAST:  69mL OMNIPAQUE IOHEXOL 300 MG/ML SOLN, 173mL OMNIPAQUE IOHEXOL 300 MG/ML SOLN  COMPARISON:  None.  FINDINGS: There is a 4 mm nodule in the right middle lobe. 4 mm nodule is also present in the right lower lobe. Subpleural opacities in the lung bases may reflect scarring, atelectasis, or possibly mild fibrosis. There is no pleural effusion.  The liver is diffusely low in attenuation, compatible with mild to moderate steatosis. Punctate calcifications in the liver and spleen likely reflect prior granulomatous infection. The gallbladder, adrenal glands, and pancreas have an unremarkable enhanced appearance. Small bilateral renal cyst and bilateral extrarenal pelves are noted.  There is a small hiatal hernia. There is a focal area of wall thickening involving the hepatic flexure of the colon measuring approximately 3 cm in length (series 2, image 34). The colon  distal to this is decompressed. The more proximal colon is fluid-filled and dilated. There is also dilatation of multiple mid and distal small bowel loops, which are fluid-filled and measure up to 3.7 cm in diameter.  Moderate atherosclerotic aortic calcification is present. Uterus and ovaries are identified. No pelvic mass is seen. Bladder is unremarkable. No free fluid or enlarged lymph nodes are identified. Prior right total hip arthroplasty is noted. Multilevel degenerative disc disease is present in the lower thoracic and lumbar spine. Left-greater-than-right facet arthrosis is present in the lower lumbar spine, greatest at L4-5.  IMPRESSION: 1. Focal wall thickening in the hepatic flexure of the colon with resultant bowel obstruction. Findings are concerning for primary colonic malignancy. 2. 4 mm nodules in the right lung base, indeterminate. 3. Hepatic steatosis. 4. Small hiatal hernia. These results were called by telephone at the time of interpretation on 07/06/2013 at 9:43 AM to Dr. Pattricia Boss , who verbally acknowledged these results.   Electronically Signed   By: Logan Bores   On: 07/06/2013 09:45  I have reviewed the report and personally reviewed the above radiology studies.  EKG Interpretation None      MDM  26 female with abdominal pain for several weeks now unable to eat or drink without pain and vomiting with CT that shows wall thickening in the hepatic flexure concerning for colonic malignancy  With bowel obstruction. Patient is not actively vomiting and pain is not severe currently. She is made n.p.o. and do not currently plan to place an NG. General surgery is consulted.  NG tube placed per surgery recommendation.   Discussed with Dr. Wendee Beavers and plan admission to med surgery bed.  Final diagnoses:  Colonic mass  Colonic obstruction  Nausea and vomiting        Shaune Pollack, MD 07/06/13 1047

## 2013-07-06 NOTE — Consult Note (Signed)
[name] May 26, 1924  [id number].   Primary Care MD: Dr. Melinda Crutch Requesting MD: Dr. Pattricia Boss Chief Complaint/Reason for Consult: colonic obstruction HPI: This is an 80 yo otherwise relatively healthy white female who developed "gripey" crampy abdominal pain about 2-3 weeks ago.  She has been unable to eat for this time as well.  She passed some liquid stool on Saturday but nothing since that time.  She denies any change in caliber of her stool or hematochezia.  She has started have nausea and vomiting the last 2-3 days as well.  She denies fevers, chills, weight loss, gain, night sweats, or family history of GI malignancy.  She last had a colonoscopy 8 years ago by Dr. Amedeo Plenty.  She states this was normal with no evidence of any concerns or even polyps.  She presented to the [facility] today due to continued symptoms.  She had a CT scan that revealed thickening at her hepatic flexure with resultant bowel obstruction, likely a colonic malignancy.  We have been asked to evaluate the patient for further recommendations.  ROS : Please see HPI, otherwise all other systems are currently negative  History reviewed. No pertinent family history.  Past Medical History  Diagnosis Date  . Hypertension   . Hyperlipidemia     Past Surgical History  Procedure Laterality Date  . Total hip arthroplasty      Social History:  reports that she has never smoked. She does not have any smokeless tobacco history on file. She reports that she does not drink alcohol or use illicit drugs.  Allergies: No Known Allergies   (Not in a hospital admission)  Blood pressure 142/70, pulse 92, temperature 98.1 F (36.7 C), temperature source Oral, resp. rate 13, height _0  (1.626 m), weight 150 lb (68.04 kg), SpO2 96.00%. Physical Exam: General: pleasant, WD, WN white female who is laying in bed in NAD HEENT: head is normocephalic, atraumatic.  Sclera are noninjected.  PERRL.  Ears and nose without any masses or  lesions.  Mouth is pink and moist Heart: regular, rate, and rhythm.  Normal s1,s2. No obvious murmurs, gallops, or rubs noted.  Palpable radial and pedal pulses bilaterally Lungs: CTAB, no wheezes, rhonchi, or rales noted.  Respiratory effort nonlabored Abd: soft, diffusely tender, but greatest on right side.  No rebounding or guarding, distended, +BS, no masses, hernias, or organomegaly MS: all 4 extremities are symmetrical with no cyanosis, clubbing, or edema. Skin: warm and dry with no masses, lesions, or rashes Psych: A&Ox3 with an appropriate affect.    Results for orders placed during the hospital encounter of 07/06/13 (from the past 48 hour(s))  POC OCCULT BLOOD, ED     Status: None   Collection Time    07/06/13  7:47 AM      Result Value Ref Range   Fecal Occult Bld NEGATIVE  NEGATIVE  CBC WITH DIFFERENTIAL     Status: Abnormal   Collection Time    07/06/13  8:06 AM      Result Value Ref Range   WBC 10.5  4.0 - 10.5 K/uL   RBC 4.69  3.87 - 5.11 MIL/uL   Hemoglobin 12.2  12.0 - 15.0 g/dL   HCT 37.1  36.0 - 46.0 %   MCV 79.1  78.0 - 100.0 fL   MCH 26.0  26.0 - 34.0 pg   MCHC 32.9  30.0 - 36.0 g/dL   RDW 15.5  11.5 - 15.5 %   Platelets 318  150 -  400 K/uL   Neutrophils Relative % 80 (*) 43 - 77 %   Neutro Abs 8.4 (*) 1.7 - 7.7 K/uL   Lymphocytes Relative 13  12 - 46 %   Lymphs Abs 1.4  0.7 - 4.0 K/uL   Monocytes Relative 6  3 - 12 %   Monocytes Absolute 0.6  0.1 - 1.0 K/uL   Eosinophils Relative 1  0 - 5 %   Eosinophils Absolute 0.1  0.0 - 0.7 K/uL   Basophils Relative 0  0 - 1 %   Basophils Absolute 0.0  0.0 - 0.1 K/uL  COMPREHENSIVE METABOLIC PANEL     Status: Abnormal   Collection Time    07/06/13  8:06 AM      Result Value Ref Range   Sodium 136 (*) 137 - 147 mEq/L   Potassium 3.4 (*) 3.7 - 5.3 mEq/L   Chloride 96  96 - 112 mEq/L   CO2 25  19 - 32 mEq/L   Glucose, Bld 141 (*) 70 - 99 mg/dL   BUN 19  6 - 23 mg/dL   Creatinine, Ser 0.85  0.50 - 1.10 mg/dL    Calcium 10.1  8.4 - 10.5 mg/dL   Total Protein 7.4  6.0 - 8.3 g/dL   Albumin 3.6  3.5 - 5.2 g/dL   AST 16  0 - 37 U/L   ALT 11  0 - 35 U/L   Alkaline Phosphatase 71  39 - 117 U/L   Total Bilirubin 0.3  0.3 - 1.2 mg/dL   GFR calc non Af Amer 60 (*) >90 mL/min   GFR calc Af Amer 69 (*) >90 mL/min   Comment: (NOTE)     The eGFR has been calculated using the CKD EPI equation.     This calculation has not been validated in all clinical situations.     eGFR's persistently <90 mL/min signify possible Chronic Kidney     Disease.  LIPASE, BLOOD     Status: None   Collection Time    07/06/13  8:06 AM      Result Value Ref Range   Lipase 17  11 - 59 U/L  URINALYSIS, ROUTINE W REFLEX MICROSCOPIC     Status: Abnormal   Collection Time    07/06/13  9:39 AM      Result Value Ref Range   Color, Urine YELLOW  YELLOW   APPearance CLEAR  CLEAR   Specific Gravity, Urine 1.022  1.005 - 1.030   pH 6.0  5.0 - 8.0   Glucose, UA NEGATIVE  NEGATIVE mg/dL   Hgb urine dipstick NEGATIVE  NEGATIVE   Bilirubin Urine NEGATIVE  NEGATIVE   Ketones, ur NEGATIVE  NEGATIVE mg/dL   Protein, ur NEGATIVE  NEGATIVE mg/dL   Urobilinogen, UA 0.2  0.0 - 1.0 mg/dL   Nitrite NEGATIVE  NEGATIVE   Leukocytes, UA TRACE (*) NEGATIVE  URINE MICROSCOPIC-ADD ON     Status: Abnormal   Collection Time    07/06/13  9:39 AM      Result Value Ref Range   Squamous Epithelial / LPF FEW (*) RARE   WBC, UA 0-2  <3 WBC/hpf   RBC / HPF 0-2  <3 RBC/hpf   Bacteria, UA FEW (*) RARE   Urine-Other MUCOUS PRESENT     Ct Abdomen Pelvis W Contrast  07/06/2013   CLINICAL DATA:  Intermittent generalized abdominal pain for 2 weeks.  EXAM: CT ABDOMEN AND PELVIS WITH CONTRAST  TECHNIQUE: Multidetector  CT imaging of the abdomen and pelvis was performed using the standard protocol following bolus administration of intravenous contrast.  CONTRAST:  2m OMNIPAQUE IOHEXOL 300 MG/ML SOLN, 1016mOMNIPAQUE IOHEXOL 300 MG/ML SOLN  COMPARISON:  None.   FINDINGS: There is a 4 mm nodule in the right middle lobe. 4 mm nodule is also present in the right lower lobe. Subpleural opacities in the lung bases may reflect scarring, atelectasis, or possibly mild fibrosis. There is no pleural effusion.  The liver is diffusely low in attenuation, compatible with mild to moderate steatosis. Punctate calcifications in the liver and spleen likely reflect prior granulomatous infection. The gallbladder, adrenal glands, and pancreas have an unremarkable enhanced appearance. Small bilateral renal cyst and bilateral extrarenal pelves are noted.  There is a small hiatal hernia. There is a focal area of wall thickening involving the hepatic flexure of the colon measuring approximately 3 cm in length (series 2, image 34). The colon distal to this is decompressed. The more proximal colon is fluid-filled and dilated. There is also dilatation of multiple mid and distal small bowel loops, which are fluid-filled and measure up to 3.7 cm in diameter.  Moderate atherosclerotic aortic calcification is present. Uterus and ovaries are identified. No pelvic mass is seen. Bladder is unremarkable. No free fluid or enlarged lymph nodes are identified. Prior right total hip arthroplasty is noted. Multilevel degenerative disc disease is present in the lower thoracic and lumbar spine. Left-greater-than-right facet arthrosis is present in the lower lumbar spine, greatest at L4-5.  IMPRESSION: 1. Focal wall thickening in the hepatic flexure of the colon with resultant bowel obstruction. Findings are concerning for primary colonic malignancy. 2. 4 mm nodules in the right lung base, indeterminate. 3. Hepatic steatosis. 4. Small hiatal hernia. These results were called by telephone at the time of interpretation on 07/06/2013 at 9:43 AM to Dr. DAPattricia Boss who verbally acknowledged these results.   Electronically Signed   By: AlLogan Bores On: 07/06/2013 09:45   Dg Chest Port 1 View  07/06/2013   CLINICAL  DATA:  Mid upper back pain for 1-2 weeks  EXAM: PORTABLE CHEST - 1 VIEW  COMPARISON:  None.  FINDINGS: The heart size and mediastinal contours are within normal limits. Both lungs are clear. Bilateral glenohumeral osteoarthritis.  IMPRESSION: No active disease.   Electronically Signed   By: HeKathreen Devoid On: 07/06/2013 10:41       Assessment/Plan 1. Colonic obstruction, likely malignant source 2. N/V secondary to #1 3. HTN 4. Hyperlipidemia  Plan: 1. Agree with medical admission.  I have already spoken to Dr. GaPenelope Coopith EaSadie HaberI about evaluating the patient for a colonoscopy.  She will need surgery no matter what, but we want to make sure there are no other findings or synchronous lesions in the remaining section of her colon.  Once this is complete, we can look at moving forward with surgical intervention. 2. Insert NGT for decompression 3. Check a CEA level and a prealbumin. 4. Given no general food intake for 2-3 weeks, she is likely somewhat malnourished.  Would likely go ahead and consider PICC placement and TNA given it will be over a week before she can eat again. 5. I have discussed CT scan findings with the patient and her son and daughter.  They all understand and agree with current plan of care.   KeHenreitta Cea/29/2015, 11:33 AM Pager: [phone]

## 2013-07-06 NOTE — H&P (Signed)
[facility] History and Physical  [name] ZOX:[id number] DOB: 22-Jul-1924 DOA: 07/06/2013  Referring physician: Dr. Jeanell Sparrow PCP: Melinda Crutch   Chief Complaint: Abdominal discomfort with new colonic mass  HPI: [name] is a 80 y.o. female  With history of hypertension. Who presents to the ED complaining of abdominal discomfort for 2 weeks duration. Nothing she is aware of makes it better. Eating food makes the pain worse. Patient has had decreased appetite and increase in nausea as well as vomiting. While in the emergency department had CT scan which showed new colonic mass. General surgery was consulted and recommended medical admission. The patient states the pain is over her abdomen. Since onset has been persistent and gradually getting worse.   Review of Systems:  Constitutional:  No weight loss, night sweats, Fevers, chills, fatigue.  HEENT:  No headaches, Difficulty swallowing,Tooth/dental problems,Sore throat,  No sneezing, itching, ear ache, nasal congestion, post nasal drip,  Cardio-vascular:  No chest pain, Orthopnea, PND, swelling in lower extremities, anasarca, dizziness, palpitations  GI:  No heartburn, indigestion, + abdominal pain, + nausea, + vomiting, diarrhea, change in bowel habits,  Resp:  No shortness of breath with exertion or at rest. No excess mucus, no productive cough, No non-productive cough, No coughing up of blood.No change in color of mucus.No wheezing.No chest wall deformity  Skin:  no rash or lesions.  GU:  no dysuria, change in color of urine, no urgency or frequency. No flank pain.  Musculoskeletal:  No joint pain or swelling. No decreased range of motion. No back pain.  Psych:  No change in mood or affect. No depression or anxiety. No memory loss.   Past Medical History  Diagnosis Date  . Hypertension   . Hyperlipidemia    Past Surgical History  Procedure Laterality Date  . Total hip arthroplasty     Social History:  reports  that she has never smoked. She does not have any smokeless tobacco history on file. She reports that she does not drink alcohol or use illicit drugs.  No Known Allergies  History reviewed. No pertinent family history.   Prior to Admission medications   Medication Sig Start Date End Date Taking? Authorizing Provider  amLODipine (NORVASC) 5 MG tablet Take 5 mg by mouth daily.  02/01/13  Yes Historical Provider, MD  aspirin 81 MG tablet Take 81 mg by mouth daily.   Yes Historical Provider, MD  atorvastatin (LIPITOR) 10 MG tablet Take 10 mg by mouth daily.  01/26/13  Yes Historical Provider, MD  ciprofloxacin (CIPRO) 250 MG tablet Take 250 mg by mouth 2 (two) times daily.   Yes Historical Provider, MD  losartan-hydrochlorothiazide (HYZAAR) 100-25 MG per tablet Take 1 tablet by mouth daily.  02/16/13  Yes Historical Provider, MD   Physical Exam: Filed Vitals:   07/06/13 1209  BP: 152/75  Pulse: 84  Temp: 98.3 F (36.8 C)  Resp: 16    BP 152/75  Pulse 84  Temp(Src) 98.3 F (36.8 C) (Oral)  Resp 16  Ht 5\' 4"  (1.626 m)  Wt 67.631 kg (149 lb 1.6 oz)  BMI 25.58 kg/m2  SpO2 98%  General:  Appears calm and comfortable Eyes: PERRL, normal lids, irises & conjunctiva ENT: grossly normal hearing, lips & tongue Neck: no LAD, masses or thyromegaly Cardiovascular: RRR, no m/r/g. No LE edema. Telemetry: SR, no arrhythmias  Respiratory: CTA bilaterally, no w/r/r. Normal respiratory effort. Abdomen: Hypoactive bowel sounds, soft, positive mild distention Skin: no rash or induration seen on limited  exam Musculoskeletal: grossly normal tone BUE/BLE Psychiatric: grossly normal mood and affect, speech fluent and appropriate Neurologic: grossly non-focal.          Labs on Admission:  Basic Metabolic Panel:  Recent Labs Lab 07/06/13 0806  NA 136*  K 3.4*  CL 96  CO2 25  GLUCOSE 141*  BUN 19  CREATININE 0.85  CALCIUM 10.1   Liver Function Tests:  Recent Labs Lab 07/06/13 0806    AST 16  ALT 11  ALKPHOS 71  BILITOT 0.3  PROT 7.4  ALBUMIN 3.6    Recent Labs Lab 07/06/13 0806  LIPASE 17   No results found for this basename: AMMONIA,  in the last 168 hours CBC:  Recent Labs Lab 07/06/13 0806  WBC 10.5  NEUTROABS 8.4*  HGB 12.2  HCT 37.1  MCV 79.1  PLT 318   Cardiac Enzymes: No results found for this basename: CKTOTAL, CKMB, CKMBINDEX, TROPONINI,  in the last 168 hours  BNP (last 3 results) No results found for this basename: PROBNP,  in the last 8760 hours CBG: No results found for this basename: GLUCAP,  in the last 168 hours  Radiological Exams on Admission: Ct Abdomen Pelvis W Contrast  07/06/2013   CLINICAL DATA:  Intermittent generalized abdominal pain for 2 weeks.  EXAM: CT ABDOMEN AND PELVIS WITH CONTRAST  TECHNIQUE: Multidetector CT imaging of the abdomen and pelvis was performed using the standard protocol following bolus administration of intravenous contrast.  CONTRAST:  80mL OMNIPAQUE IOHEXOL 300 MG/ML SOLN, 152mL OMNIPAQUE IOHEXOL 300 MG/ML SOLN  COMPARISON:  None.  FINDINGS: There is a 4 mm nodule in the right middle lobe. 4 mm nodule is also present in the right lower lobe. Subpleural opacities in the lung bases may reflect scarring, atelectasis, or possibly mild fibrosis. There is no pleural effusion.  The liver is diffusely low in attenuation, compatible with mild to moderate steatosis. Punctate calcifications in the liver and spleen likely reflect prior granulomatous infection. The gallbladder, adrenal glands, and pancreas have an unremarkable enhanced appearance. Small bilateral renal cyst and bilateral extrarenal pelves are noted.  There is a small hiatal hernia. There is a focal area of wall thickening involving the hepatic flexure of the colon measuring approximately 3 cm in length (series 2, image 34). The colon distal to this is decompressed. The more proximal colon is fluid-filled and dilated. There is also dilatation of multiple  mid and distal small bowel loops, which are fluid-filled and measure up to 3.7 cm in diameter.  Moderate atherosclerotic aortic calcification is present. Uterus and ovaries are identified. No pelvic mass is seen. Bladder is unremarkable. No free fluid or enlarged lymph nodes are identified. Prior right total hip arthroplasty is noted. Multilevel degenerative disc disease is present in the lower thoracic and lumbar spine. Left-greater-than-right facet arthrosis is present in the lower lumbar spine, greatest at L4-5.  IMPRESSION: 1. Focal wall thickening in the hepatic flexure of the colon with resultant bowel obstruction. Findings are concerning for primary colonic malignancy. 2. 4 mm nodules in the right lung base, indeterminate. 3. Hepatic steatosis. 4. Small hiatal hernia. These results were called by telephone at the time of interpretation on 07/06/2013 at 9:43 AM to Dr. Pattricia Boss , who verbally acknowledged these results.   Electronically Signed   By: Logan Bores   On: 07/06/2013 09:45   Dg Chest Port 1 View  07/06/2013   CLINICAL DATA:  Mid upper back pain for 1-2 weeks  EXAM:  PORTABLE CHEST - 1 VIEW  COMPARISON:  None.  FINDINGS: The heart size and mediastinal contours are within normal limits. Both lungs are clear. Bilateral glenohumeral osteoarthritis.  IMPRESSION: No active disease.   Electronically Signed   By: Kathreen Devoid   On: 07/06/2013 10:41    EKG: Independently reviewed. Sinus rhythm with minimal ST depression in lateral leads  Assessment/Plan Active Problems: Colonic mass - Gastroenterology and general surgery consulted - Admit to med surge bed - We'll plan on providing supportive care and continuing pain control - Continue patient n.p.o. - General surgery considering PICC line placement for TPN administration, we'll place on maintenance IV fluid for now  HTN - Hold oral antihypertensive agents given principal problem  DVT prophylaxis: -Heparin  Code Status: full Family  Communication: None at bedside Disposition Plan: Per specialist involved  Time spent: > 35 minutes  [facility] Pager [phone]

## 2013-07-06 NOTE — ED Notes (Signed)
Patient arrives with c/o intermittent generalized abdominal pain, which she states has been on-going for two weeks Patient states that she was seen at [facility] on Saturday, diagnosed with a UTI and given Rx for antibiotics Patient reports that she has been taking the antibiotics, but believes "They are making me sick." Patient reports episodes of diarrhea on Saturday, but none since then

## 2013-07-06 NOTE — ED Notes (Signed)
Pt reports to MD that she vomited this morning and it was "dark."  Pt is concerned that prescribed antibiotic is causing symptoms.

## 2013-07-06 NOTE — ED Notes (Signed)
Pt is aware that we need a urine sample.  Report that she can not provide a sample at this time.

## 2013-07-07 LAB — CBC
HEMATOCRIT: 34.1 % — AB (ref 36.0–46.0)
Hemoglobin: 10.6 g/dL — ABNORMAL LOW (ref 12.0–15.0)
MCH: 25.4 pg — AB (ref 26.0–34.0)
MCHC: 31.1 g/dL (ref 30.0–36.0)
MCV: 81.8 fL (ref 78.0–100.0)
Platelets: 313 10*3/uL (ref 150–400)
RBC: 4.17 MIL/uL (ref 3.87–5.11)
RDW: 15.9 % — ABNORMAL HIGH (ref 11.5–15.5)
WBC: 8.3 10*3/uL (ref 4.0–10.5)

## 2013-07-07 LAB — PREALBUMIN: Prealbumin: 16.5 mg/dL — ABNORMAL LOW (ref 17.0–34.0)

## 2013-07-07 LAB — COMPREHENSIVE METABOLIC PANEL
ALK PHOS: 62 U/L (ref 39–117)
ALT: 10 U/L (ref 0–35)
AST: 16 U/L (ref 0–37)
Albumin: 3 g/dL — ABNORMAL LOW (ref 3.5–5.2)
BUN: 13 mg/dL (ref 6–23)
CO2: 25 mEq/L (ref 19–32)
CREATININE: 0.77 mg/dL (ref 0.50–1.10)
Calcium: 9.2 mg/dL (ref 8.4–10.5)
Chloride: 102 mEq/L (ref 96–112)
GFR calc Af Amer: 85 mL/min — ABNORMAL LOW (ref >90)
GFR calc non Af Amer: 73 mL/min — ABNORMAL LOW (ref >90)
Glucose, Bld: 149 mg/dL — ABNORMAL HIGH (ref 70–99)
POTASSIUM: 3.7 meq/L (ref 3.7–5.3)
Sodium: 139 mEq/L (ref 137–147)
TOTAL PROTEIN: 6.3 g/dL (ref 6.0–8.3)
Total Bilirubin: 0.3 mg/dL (ref 0.3–1.2)

## 2013-07-07 LAB — CEA: CEA: 2.1 ng/mL (ref 0.0–5.0)

## 2013-07-07 MED ORDER — ZOLPIDEM TARTRATE 5 MG PO TABS
5.0000 mg | ORAL_TABLET | Freq: Once | ORAL | Status: DC
  Filled 2013-07-07: qty 1

## 2013-07-07 MED ORDER — FLEET ENEMA 7-19 GM/118ML RE ENEM
1.0000 | ENEMA | Freq: Once | RECTAL | Status: AC
  Administered 2013-07-07: 1 via RECTAL
  Filled 2013-07-07: qty 1

## 2013-07-07 NOTE — Progress Notes (Signed)
General Surgery [facility]  Agree.  Await results of prep and colonoscopy.  Will discuss timing of surgery tomorrow with patient and family.  Earnstine Regal, MD, [facility] Office: [phone]

## 2013-07-07 NOTE — Progress Notes (Signed)
[facility] PROGRESS NOTE  [name] LOV:[id number] DOB: 12/19/1924 DOA: 07/06/2013 PCP: Melinda Crutch  Assessment/Plan:  Active Problems:   Colonic mass - General surgery on board -Gastroenterology consulted and plans are for patient to get enemas for eventual colonoscopy in hopes of obtaining biopsies for diagnosis - Otherwise continue supportive therapy with Zofran and pain control with morphine    HTN (hypertension) -Relatively well controlled off of antihypertensive agents. We'll continue to monitor  Code Status: Full Family Communication: Discussed with patient and daughter at bedside  Disposition Plan: Pending recommendations from specialist  Consultants:  Gastroenterology  General surgery  Procedures:  To be determined  Antibiotics:  None  HPI/Subjective: No new complaints. States that her abdomen is still sore but that the morphine is helping.  Objective: Filed Vitals:   07/07/13 0625  BP: 133/59  Pulse: 69  Temp: 99.1 F (37.3 C)  Resp: 18    Intake/Output Summary (Last 24 hours) at 07/07/13 1209 Last data filed at 07/07/13 1132  Gross per 24 hour  Intake     50 ml  Output    900 ml  Net   -850 ml   Filed Weights   07/06/13 0706 07/06/13 1209  Weight: 68.04 kg (150 lb) 67.631 kg (149 lb 1.6 oz)    Exam:   General: Patient in no acute distress, alert and awake  Cardiovascular: Regular rate and rhythm no murmurs rubs or gallops  Respiratory: Clear to auscultation, no wheezes or rales  Abdomen: Soft, nondistended, tenderness at left upper quadrant  Musculoskeletal: No cyanosis or clubbing  Data Reviewed: Basic Metabolic Panel:  Recent Labs Lab 07/06/13 0806 07/06/13 1305 07/07/13 0404  NA 136*  --  139  K 3.4*  --  3.7  CL 96  --  102  CO2 25  --  25  GLUCOSE 141*  --  149*  BUN 19  --  13  CREATININE 0.85 0.82 0.77  CALCIUM 10.1  --  9.2  MG  --  1.9  --   PHOS  --  4.1  --    Liver Function Tests:  Recent  Labs Lab 07/06/13 0806 07/07/13 0404  AST 16 16  ALT 11 10  ALKPHOS 71 62  BILITOT 0.3 0.3  PROT 7.4 6.3  ALBUMIN 3.6 3.0*    Recent Labs Lab 07/06/13 0806  LIPASE 17   No results found for this basename: AMMONIA,  in the last 168 hours CBC:  Recent Labs Lab 07/06/13 0806 07/06/13 1305 07/07/13 0404  WBC 10.5 9.6 8.3  NEUTROABS 8.4*  --   --   HGB 12.2 11.7* 10.6*  HCT 37.1 35.5* 34.1*  MCV 79.1 79.6 81.8  PLT 318 323 313   Cardiac Enzymes: No results found for this basename: CKTOTAL, CKMB, CKMBINDEX, TROPONINI,  in the last 168 hours BNP (last 3 results) No results found for this basename: PROBNP,  in the last 8760 hours CBG: No results found for this basename: GLUCAP,  in the last 168 hours  No results found for this or any previous visit (from the past 240 hour(s)).   Studies: Ct Abdomen Pelvis W Contrast  07/06/2013   CLINICAL DATA:  Intermittent generalized abdominal pain for 2 weeks.  EXAM: CT ABDOMEN AND PELVIS WITH CONTRAST  TECHNIQUE: Multidetector CT imaging of the abdomen and pelvis was performed using the standard protocol following bolus administration of intravenous contrast.  CONTRAST:  80mL OMNIPAQUE IOHEXOL 300 MG/ML SOLN, 161mL OMNIPAQUE IOHEXOL 300 MG/ML SOLN  COMPARISON:  None.  FINDINGS: There is a 4 mm nodule in the right middle lobe. 4 mm nodule is also present in the right lower lobe. Subpleural opacities in the lung bases may reflect scarring, atelectasis, or possibly mild fibrosis. There is no pleural effusion.  The liver is diffusely low in attenuation, compatible with mild to moderate steatosis. Punctate calcifications in the liver and spleen likely reflect prior granulomatous infection. The gallbladder, adrenal glands, and pancreas have an unremarkable enhanced appearance. Small bilateral renal cyst and bilateral extrarenal pelves are noted.  There is a small hiatal hernia. There is a focal area of wall thickening involving the hepatic flexure  of the colon measuring approximately 3 cm in length (series 2, image 34). The colon distal to this is decompressed. The more proximal colon is fluid-filled and dilated. There is also dilatation of multiple mid and distal small bowel loops, which are fluid-filled and measure up to 3.7 cm in diameter.  Moderate atherosclerotic aortic calcification is present. Uterus and ovaries are identified. No pelvic mass is seen. Bladder is unremarkable. No free fluid or enlarged lymph nodes are identified. Prior right total hip arthroplasty is noted. Multilevel degenerative disc disease is present in the lower thoracic and lumbar spine. Left-greater-than-right facet arthrosis is present in the lower lumbar spine, greatest at L4-5.  IMPRESSION: 1. Focal wall thickening in the hepatic flexure of the colon with resultant bowel obstruction. Findings are concerning for primary colonic malignancy. 2. 4 mm nodules in the right lung base, indeterminate. 3. Hepatic steatosis. 4. Small hiatal hernia. These results were called by telephone at the time of interpretation on 07/06/2013 at 9:43 AM to Dr. Pattricia Boss , who verbally acknowledged these results.   Electronically Signed   By: Logan Bores   On: 07/06/2013 09:45   Dg Chest Port 1 View  07/06/2013   CLINICAL DATA:  Mid upper back pain for 1-2 weeks  EXAM: PORTABLE CHEST - 1 VIEW  COMPARISON:  None.  FINDINGS: The heart size and mediastinal contours are within normal limits. Both lungs are clear. Bilateral glenohumeral osteoarthritis.  IMPRESSION: No active disease.   Electronically Signed   By: Kathreen Devoid   On: 07/06/2013 10:41    Scheduled Meds: . heparin  5,000 Units Subcutaneous 3 times per day   Continuous Infusions: . dextrose 5 % and 0.45 % NaCl with KCl 20 mEq/L 100 mL/hr at 07/07/13 0818    Time spent:> 35 minutes    [facility] Pager [phone] If 7PM-7AM, please contact night-coverage at [email or web address], password [facility] 07/07/2013, 12:09 PM   LOS: 1 day

## 2013-07-07 NOTE — Progress Notes (Signed)
INITIAL NUTRITION ASSESSMENT  DOCUMENTATION CODES Per approved criteria  -Not Applicable   INTERVENTION: -Diet advancement per MD -TPN per pharmacy/surgery recommendations -Will continue to monitor  NUTRITION DIAGNOSIS: Inadequate oral intake related to inability to eat as evidenced by NPO status.   Goal: Pt to meet >/= 90% of their estimated nutrition needs    Monitor:  Diet order, GI profile, labs, weights, total protein/energy intake  Reason for Assessment: MST  80 y.o. female  Admitting Dx: <principal problem not specified>  ASSESSMENT: [name] is a 80 y.o. female With history of hypertension. Who presents to the ED complaining of abdominal discomfort for 2 weeks duration. Nothing she is aware of makes it better. Eating food makes the pain worse. Patient has had decreased appetite and increase in nausea as well as vomiting. While in the emergency department had CT scan which showed new colonic mass. General surgery was consulted and recommended medical admission. The patient states the pain is over her abdomen. Since onset has been persistent and gradually getting worse.  -Pt endorsed overall decreased PO intake for past 3 weeks -Noted feelings of taste changes-"nothing tasted right", abd pain, and burping/reflux like symptoms -Had appetite but was unable to find foods she was able to tolerate/enjoy eating -Diet recall indicated pt consuming eggs/cereal for breakfast and small meals for lunch and dinner. Would try to eat out at different restaurants to promote appetite -Also attributed nausea/decreased appetite from recent UTI and antibiotic regimen pt was started on last week -Denied any unintentional wt loss -NGT on low intermittent suction -Pt plan for colonoscopy r/t colonic mass and concern for malignancy -Family had reported possible initiation of TPN; discussed diet advancement with MD. MD noted nutrition support initiation will be decided per surgery  recommendations -Will continue to monitor. Pt has tried Ensure/Boost supplements pta, and was willing to restart supplement regimen as tolerated -Refeeding labs all WNL -Low albumin  Height: Ht Readings from Last 1 Encounters:  07/06/13 5\' 4"  (1.626 m)    Weight: Wt Readings from Last 1 Encounters:  07/06/13 149 lb 1.6 oz (67.631 kg)    Ideal Body Weight: 120 lbs  % Ideal Body Weight: 124%  Wt Readings from Last 10 Encounters:  07/06/13 149 lb 1.6 oz (67.631 kg)  02/23/13 149 lb (67.586 kg)    Usual Body Weight: 149 lbs  % Usual Body Weight: 100%  BMI:  Body mass index is 25.58 kg/(m^2).  Estimated Nutritional Needs: Kcal: 1550-1750 Protein:70-80 gram Fluid: >/=1700 ml/daily  Skin: WDL, no edema  Diet Order: NPO  EDUCATION NEEDS: -No education needs identified at this time   Intake/Output Summary (Last 24 hours) at 07/07/13 1057 Last data filed at 07/07/13 0900  Gross per 24 hour  Intake     50 ml  Output   1150 ml  Net  -1100 ml    Last BM: 4/25-hypoactive BS   Labs:   Recent Labs Lab 07/06/13 0806 07/06/13 1305 07/07/13 0404  NA 136*  --  139  K 3.4*  --  3.7  CL 96  --  102  CO2 25  --  25  BUN 19  --  13  CREATININE 0.85 0.82 0.77  CALCIUM 10.1  --  9.2  MG  --  1.9  --   PHOS  --  4.1  --   GLUCOSE 141*  --  149*    CBG (last 3)  No results found for this basename: GLUCAP,  in the last 72  hours  Scheduled Meds: . heparin  5,000 Units Subcutaneous 3 times per day    Continuous Infusions: . dextrose 5 % and 0.45 % NaCl with KCl 20 mEq/L 100 mL/hr at 07/07/13 4627    Past Medical History  Diagnosis Date  . Hypertension   . Hyperlipidemia     Past Surgical History  Procedure Laterality Date  . Total hip arthroplasty      Atlee Abide MS RD LDN Clinical Dietitian [phone]

## 2013-07-07 NOTE — Progress Notes (Signed)
[facility] Progress Note  Subjective: She is doing okay today.  Objective: Vital signs in last 24 hours: Temp:  [98.8 F (37.1 C)-99.1 F (37.3 C)] 99.1 F (37.3 C) (04/30 1424) Pulse Rate:  [69-73] 72 (04/30 1424) Resp:  [16-18] 16 (04/30 1424) BP: (133-137)/(59-64) 137/64 mmHg (04/30 1424) SpO2:  [97 %-99 %] 98 % (04/30 1424) Weight change:    PE:  Heart regular rhythm  Lungs clear  Abdomen: Minimal distention but soft  Lab Results: Results for orders placed during the hospital encounter of 07/06/13 (from the past 24 hour(s))  COMPREHENSIVE METABOLIC PANEL     Status: Abnormal   Collection Time    07/07/13  4:04 AM      Result Value Ref Range   Sodium 139  137 - 147 mEq/L   Potassium 3.7  3.7 - 5.3 mEq/L   Chloride 102  96 - 112 mEq/L   CO2 25  19 - 32 mEq/L   Glucose, Bld 149 (*) 70 - 99 mg/dL   BUN 13  6 - 23 mg/dL   Creatinine, Ser 0.77  0.50 - 1.10 mg/dL   Calcium 9.2  8.4 - 10.5 mg/dL   Total Protein 6.3  6.0 - 8.3 g/dL   Albumin 3.0 (*) 3.5 - 5.2 g/dL   AST 16  0 - 37 U/L   ALT 10  0 - 35 U/L   Alkaline Phosphatase 62  39 - 117 U/L   Total Bilirubin 0.3  0.3 - 1.2 mg/dL   GFR calc non Af Amer 73 (*) >90 mL/min   GFR calc Af Amer 85 (*) >90 mL/min  CBC     Status: Abnormal   Collection Time    07/07/13  4:04 AM      Result Value Ref Range   WBC 8.3  4.0 - 10.5 K/uL   RBC 4.17  3.87 - 5.11 MIL/uL   Hemoglobin 10.6 (*) 12.0 - 15.0 g/dL   HCT 34.1 (*) 36.0 - 46.0 %   MCV 81.8  78.0 - 100.0 fL   MCH 25.4 (*) 26.0 - 34.0 pg   MCHC 31.1  30.0 - 36.0 g/dL   RDW 15.9 (*) 11.5 - 15.5 %   Platelets 313  150 - 400 K/uL  PREALBUMIN     Status: Abnormal   Collection Time    07/07/13 10:15 AM      Result Value Ref Range   Prealbumin 16.5 (*) 17.0 - 34.0 mg/dL    Studies/Results: Ct Abdomen Pelvis W Contrast  07/06/2013   CLINICAL DATA:  Intermittent generalized abdominal pain for 2 weeks.  EXAM: CT ABDOMEN AND PELVIS WITH CONTRAST  TECHNIQUE:  Multidetector CT imaging of the abdomen and pelvis was performed using the standard protocol following bolus administration of intravenous contrast.  CONTRAST:  81mL OMNIPAQUE IOHEXOL 300 MG/ML SOLN, 118mL OMNIPAQUE IOHEXOL 300 MG/ML SOLN  COMPARISON:  None.  FINDINGS: There is a 4 mm nodule in the right middle lobe. 4 mm nodule is also present in the right lower lobe. Subpleural opacities in the lung bases may reflect scarring, atelectasis, or possibly mild fibrosis. There is no pleural effusion.  The liver is diffusely low in attenuation, compatible with mild to moderate steatosis. Punctate calcifications in the liver and spleen likely reflect prior granulomatous infection. The gallbladder, adrenal glands, and pancreas have an unremarkable enhanced appearance. Small bilateral renal cyst and bilateral extrarenal pelves are noted.  There is a small hiatal hernia. There is a focal  area of wall thickening involving the hepatic flexure of the colon measuring approximately 3 cm in length (series 2, image 34). The colon distal to this is decompressed. The more proximal colon is fluid-filled and dilated. There is also dilatation of multiple mid and distal small bowel loops, which are fluid-filled and measure up to 3.7 cm in diameter.  Moderate atherosclerotic aortic calcification is present. Uterus and ovaries are identified. No pelvic mass is seen. Bladder is unremarkable. No free fluid or enlarged lymph nodes are identified. Prior right total hip arthroplasty is noted. Multilevel degenerative disc disease is present in the lower thoracic and lumbar spine. Left-greater-than-right facet arthrosis is present in the lower lumbar spine, greatest at L4-5.  IMPRESSION: 1. Focal wall thickening in the hepatic flexure of the colon with resultant bowel obstruction. Findings are concerning for primary colonic malignancy. 2. 4 mm nodules in the right lung base, indeterminate. 3. Hepatic steatosis. 4. Small hiatal hernia. These  results were called by telephone at the time of interpretation on 07/06/2013 at 9:43 AM to Dr. Pattricia Boss , who verbally acknowledged these results.   Electronically Signed   By: Logan Bores   On: 07/06/2013 09:45   Dg Chest Port 1 View  07/06/2013   CLINICAL DATA:  Mid upper back pain for 1-2 weeks  EXAM: PORTABLE CHEST - 1 VIEW  COMPARISON:  None.  FINDINGS: The heart size and mediastinal contours are within normal limits. Both lungs are clear. Bilateral glenohumeral osteoarthritis.  IMPRESSION: No active disease.   Electronically Signed   By: Kathreen Devoid   On: 07/06/2013 10:41      Assessment: Hepatic flexure mass on CT scan, rule out carcinoma of the colon  Plan: Colonoscopy tomorrow    Wonda Horner 07/07/2013, 2:32 PM

## 2013-07-07 NOTE — Progress Notes (Signed)
Subjective: NG in place, no nausea or vomiting, I worked on the NG and changed sump filter.  She is waiting on enemas.  They plan to do the colonoscopy tomorrow.    Objective: Vital signs in last 24 hours: Temp:  [98.8 F (37.1 C)-99.1 F (37.3 C)] 99.1 F (37.3 C) (04/30 0625) Pulse Rate:  [69-73] 69 (04/30 0625) Resp:  [18] 18 (04/30 0625) BP: (133)/(59-64) 133/59 mmHg (04/30 0625) SpO2:  [97 %-99 %] 97 % (04/30 0625) Last BM Date: 07/02/13 50 ml PO yesterday recorded Afebrile, VSS Labs OK Intake/Output from previous day: 04/29 0701 - 04/30 0700 In: 50 [P.O.:50] Out: 1150 [Urine:750] Intake/Output this shift: Total I/O In: 0  Out: 150 [Urine:150]  General appearance: alert, cooperative and no distress GI: soft, minimal distension. few BS, no flatus  Lab Results:   Recent Labs  07/06/13 1305 07/07/13 0404  WBC 9.6 8.3  HGB 11.7* 10.6*  HCT 35.5* 34.1*  PLT 323 313    BMET  Recent Labs  07/06/13 0806 07/06/13 1305 07/07/13 0404  NA 136*  --  139  K 3.4*  --  3.7  CL 96  --  102  CO2 25  --  25  GLUCOSE 141*  --  149*  BUN 19  --  13  CREATININE 0.85 0.82 0.77  CALCIUM 10.1  --  9.2   PT/INR No results found for this basename: LABPROT, INR,  in the last 72 hours   Recent Labs Lab 07/06/13 0806 07/07/13 0404  AST 16 16  ALT 11 10  ALKPHOS 71 62  BILITOT 0.3 0.3  PROT 7.4 6.3  ALBUMIN 3.6 3.0*     Lipase     Component Value Date/Time   LIPASE 17 07/06/2013 0806     Studies/Results: Ct Abdomen Pelvis W Contrast  07/06/2013   CLINICAL DATA:  Intermittent generalized abdominal pain for 2 weeks.  EXAM: CT ABDOMEN AND PELVIS WITH CONTRAST  TECHNIQUE: Multidetector CT imaging of the abdomen and pelvis was performed using the standard protocol following bolus administration of intravenous contrast.  CONTRAST:  50mL OMNIPAQUE IOHEXOL 300 MG/ML SOLN, 139mL OMNIPAQUE IOHEXOL 300 MG/ML SOLN  COMPARISON:  None.  FINDINGS: There is a 4 mm nodule in  the right middle lobe. 4 mm nodule is also present in the right lower lobe. Subpleural opacities in the lung bases may reflect scarring, atelectasis, or possibly mild fibrosis. There is no pleural effusion.  The liver is diffusely low in attenuation, compatible with mild to moderate steatosis. Punctate calcifications in the liver and spleen likely reflect prior granulomatous infection. The gallbladder, adrenal glands, and pancreas have an unremarkable enhanced appearance. Small bilateral renal cyst and bilateral extrarenal pelves are noted.  There is a small hiatal hernia. There is a focal area of wall thickening involving the hepatic flexure of the colon measuring approximately 3 cm in length (series 2, image 34). The colon distal to this is decompressed. The more proximal colon is fluid-filled and dilated. There is also dilatation of multiple mid and distal small bowel loops, which are fluid-filled and measure up to 3.7 cm in diameter.  Moderate atherosclerotic aortic calcification is present. Uterus and ovaries are identified. No pelvic mass is seen. Bladder is unremarkable. No free fluid or enlarged lymph nodes are identified. Prior right total hip arthroplasty is noted. Multilevel degenerative disc disease is present in the lower thoracic and lumbar spine. Left-greater-than-right facet arthrosis is present in the lower lumbar spine, greatest at L4-5.  IMPRESSION: 1. Focal wall thickening in the hepatic flexure of the colon with resultant bowel obstruction. Findings are concerning for primary colonic malignancy. 2. 4 mm nodules in the right lung base, indeterminate. 3. Hepatic steatosis. 4. Small hiatal hernia. These results were called by telephone at the time of interpretation on 07/06/2013 at 9:43 AM to Dr. Pattricia Boss , who verbally acknowledged these results.   Electronically Signed   By: Logan Bores   On: 07/06/2013 09:45   Dg Chest Port 1 View  07/06/2013   CLINICAL DATA:  Mid upper back pain for 1-2  weeks  EXAM: PORTABLE CHEST - 1 VIEW  COMPARISON:  None.  FINDINGS: The heart size and mediastinal contours are within normal limits. Both lungs are clear. Bilateral glenohumeral osteoarthritis.  IMPRESSION: No active disease.   Electronically Signed   By: Kathreen Devoid   On: 07/06/2013 10:41    Medications: . heparin  5,000 Units Subcutaneous 3 times per day    Assessment/Plan 1. Colonic obstruction, likely malignant source  2. N/V secondary to #1  3. HTN  4. Hyperlipidemia   Plan:  For colonoscopy and then we will discuss surgery.   LOS: 1 day    [name] 07/07/2013

## 2013-07-08 ENCOUNTER — Encounter (HOSPITAL_COMMUNITY): Payer: Self-pay | Admitting: Certified Registered Nurse Anesthetist

## 2013-07-08 ENCOUNTER — Encounter (HOSPITAL_COMMUNITY): Payer: Medicare Other | Admitting: Certified Registered Nurse Anesthetist

## 2013-07-08 ENCOUNTER — Encounter (HOSPITAL_COMMUNITY)
Admission: EM | Disposition: A | Discharge status: Home Health | Payer: Self-pay | Source: Home / Self Care | Attending: Family Medicine

## 2013-07-08 ENCOUNTER — Inpatient Hospital Stay (HOSPITAL_COMMUNITY): Payer: Medicare Other | Admitting: Certified Registered Nurse Anesthetist

## 2013-07-08 DIAGNOSIS — D490 Neoplasm of unspecified behavior of digestive system: Secondary | ICD-10-CM

## 2013-07-08 HISTORY — PX: COLONOSCOPY WITH PROPOFOL: SHX5780

## 2013-07-08 LAB — SURGICAL PCR SCREEN
MRSA, PCR: NEGATIVE
Staphylococcus aureus: NEGATIVE

## 2013-07-08 SURGERY — COLONOSCOPY WITH PROPOFOL
Anesthesia: Monitor Anesthesia Care

## 2013-07-08 SURGERY — ESOPHAGOGASTRODUODENOSCOPY (EGD) WITH PROPOFOL
Anesthesia: Monitor Anesthesia Care

## 2013-07-08 MED ORDER — FLEET ENEMA 7-19 GM/118ML RE ENEM
1.0000 | ENEMA | Freq: Once | RECTAL | Status: AC
  Administered 2013-07-08: 1 via RECTAL
  Filled 2013-07-08: qty 1

## 2013-07-08 MED ORDER — MIDAZOLAM HCL 5 MG/5ML IJ SOLN
INTRAMUSCULAR | Status: DC | PRN
  Administered 2013-07-08: .25 mg via INTRAVENOUS

## 2013-07-08 MED ORDER — FENTANYL CITRATE 0.05 MG/ML IJ SOLN
INTRAMUSCULAR | Status: AC
  Filled 2013-07-08: qty 2

## 2013-07-08 MED ORDER — ONDANSETRON HCL 4 MG/2ML IJ SOLN
INTRAMUSCULAR | Status: AC
  Filled 2013-07-08: qty 2

## 2013-07-08 MED ORDER — LACTATED RINGERS IV SOLN: INTRAVENOUS | Status: DC

## 2013-07-08 MED ORDER — ONDANSETRON HCL 4 MG/2ML IJ SOLN
INTRAMUSCULAR | Status: DC | PRN
  Administered 2013-07-08: 4 mg via INTRAVENOUS

## 2013-07-08 MED ORDER — LACTATED RINGERS IV SOLN
INTRAVENOUS | Status: DC | PRN
Start: 2013-07-08 — End: 2013-07-08
  Administered 2013-07-08: 10:00:00 via INTRAVENOUS

## 2013-07-08 MED ORDER — FAMOTIDINE IN NACL 20-0.9 MG/50ML-% IV SOLN
20.0000 mg | Freq: Two times a day (BID) | INTRAVENOUS | Status: DC
  Administered 2013-07-08 – 2013-07-13 (×11): 20 mg via INTRAVENOUS
  Filled 2013-07-08 (×13): qty 50

## 2013-07-08 MED ORDER — SODIUM CHLORIDE 0.9 % IV SOLN: 1.0000 g | INTRAVENOUS | Status: DC

## 2013-07-08 MED ORDER — SPOT INK MARKER SYRINGE KIT
PACK | SUBMUCOSAL | Status: AC
  Filled 2013-07-08: qty 5

## 2013-07-08 MED ORDER — PROMETHAZINE HCL 25 MG/ML IJ SOLN: 6.2500 mg | INTRAMUSCULAR | Status: DC | PRN

## 2013-07-08 MED ORDER — PROPOFOL 10 MG/ML IV BOLUS
INTRAVENOUS | Status: AC
  Filled 2013-07-08: qty 20

## 2013-07-08 MED ORDER — PROPOFOL 10 MG/ML IV BOLUS
INTRAVENOUS | Status: DC | PRN
  Administered 2013-07-08 (×7): 20 mg via INTRAVENOUS

## 2013-07-08 MED ORDER — MIDAZOLAM HCL 2 MG/2ML IJ SOLN
INTRAMUSCULAR | Status: AC
  Filled 2013-07-08: qty 2

## 2013-07-08 MED ORDER — FENTANYL CITRATE 0.05 MG/ML IJ SOLN
INTRAMUSCULAR | Status: DC | PRN
  Administered 2013-07-08 (×2): 50 ug via INTRAVENOUS

## 2013-07-08 MED ORDER — SPOT INK MARKER SYRINGE KIT
PACK | SUBMUCOSAL | Status: DC | PRN
  Administered 2013-07-08: 3 mL via SUBMUCOSAL

## 2013-07-08 MED ORDER — SODIUM CHLORIDE 0.9 % IV SOLN: INTRAVENOUS | Status: DC

## 2013-07-08 SURGICAL SUPPLY — 21 items

## 2013-07-08 NOTE — Anesthesia Postprocedure Evaluation (Signed)
Anesthesia Post Note  Patient: [name]  Procedure(s) Performed: Procedure(s) (LRB): COLONOSCOPY WITH PROPOFOL (N/A)  Anesthesia type: MAC  Patient location: PACU  Post pain: Pain level controlled  Post assessment: Post-op Vital signs reviewed  Last Vitals:  Filed Vitals:   07/08/13 1110  BP: 155/65  Pulse:   Temp:   Resp:     Post vital signs: Reviewed  Level of consciousness: sedated  Complications: No apparent anesthesia complications

## 2013-07-08 NOTE — Anesthesia Preprocedure Evaluation (Addendum)
Anesthesia Evaluation  Patient identified by MRN, date of birth, ID band Patient awake    Reviewed: Allergy & Precautions, H&P , NPO status , Patient's Chart, lab work & pertinent test results  Airway Mallampati: II TM Distance: >3 FB Neck ROM: Full    Dental  (+) Teeth Intact, Dental Advisory Given   Pulmonary neg pulmonary ROS,  breath sounds clear to auscultation  Pulmonary exam normal       Cardiovascular hypertension, Pt. on medications negative cardio ROS  Rhythm:Regular Rate:Normal     Neuro/Psych negative neurological ROS  negative psych ROS   GI/Hepatic negative GI ROS, Neg liver ROS,   Endo/Other  negative endocrine ROS  Renal/GU negative Renal ROS  negative genitourinary   Musculoskeletal negative musculoskeletal ROS (+)   Abdominal   Peds  Hematology negative hematology ROS (+)   Anesthesia Other Findings   Reproductive/Obstetrics                          Anesthesia Physical Anesthesia Plan  ASA: II  Anesthesia Plan: MAC   Post-op Pain Management:    Induction: Intravenous  Airway Management Planned: Nasal Cannula  Additional Equipment:   Intra-op Plan:   Post-operative Plan:   Informed Consent: I have reviewed the patients History and Physical, chart, labs and discussed the procedure including the risks, benefits and alternatives for the proposed anesthesia with the patient or authorized representative who has indicated his/her understanding and acceptance.   Dental advisory given  Plan Discussed with: CRNA  Anesthesia Plan Comments:         Anesthesia Quick Evaluation

## 2013-07-08 NOTE — Progress Notes (Signed)
Day of Surgery  Subjective: NG works, I'm not so sure the regulator is .  NG drainage is dark tinged.  Her only real complaint is about the NG tube.  For colonoscopy this AM.   Some flatus and results with 2 enemas.  Objective: Vital signs in last 24 hours: Temp:  [98.5 F (36.9 C)-99.1 F (37.3 C)] 98.5 F (36.9 C) (05/01 0443) Pulse Rate:  [70-74] 70 (05/01 0443) Resp:  [16-18] 16 (05/01 0443) BP: (137-145)/(64-74) 145/66 mmHg (05/01 0443) SpO2:  [98 %] 98 % (05/01 0443) Last BM Date: 07/08/13 NPO one stool recorded for yesterday Afebrile, VSS Prealbumin 16.5 No other labs Intake/Output from previous day: 04/30 0701 - 05/01 0700 In: 0  Out: 1300 [Urine:1100; Emesis/NG output:200] Intake/Output this shift: Total I/O In: -  Out: 100 [Emesis/NG output:100]  General appearance: alert, cooperative and no distress GI: soft, minimal distension.  Lab Results:   Recent Labs  07/06/13 1305 07/07/13 0404  WBC 9.6 8.3  HGB 11.7* 10.6*  HCT 35.5* 34.1*  PLT 323 313    BMET  Recent Labs  07/06/13 0806 07/06/13 1305 07/07/13 0404  NA 136*  --  139  K 3.4*  --  3.7  CL 96  --  102  CO2 25  --  25  GLUCOSE 141*  --  149*  BUN 19  --  13  CREATININE 0.85 0.82 0.77  CALCIUM 10.1  --  9.2   PT/INR No results found for this basename: LABPROT, INR,  in the last 72 hours   Recent Labs Lab 07/06/13 0806 07/07/13 0404  AST 16 16  ALT 11 10  ALKPHOS 71 62  BILITOT 0.3 0.3  PROT 7.4 6.3  ALBUMIN 3.6 3.0*     Lipase     Component Value Date/Time   LIPASE 17 07/06/2013 0806     Studies/Results: Ct Abdomen Pelvis W Contrast  07/06/2013   CLINICAL DATA:  Intermittent generalized abdominal pain for 2 weeks.  EXAM: CT ABDOMEN AND PELVIS WITH CONTRAST  TECHNIQUE: Multidetector CT imaging of the abdomen and pelvis was performed using the standard protocol following bolus administration of intravenous contrast.  CONTRAST:  8mL OMNIPAQUE IOHEXOL 300 MG/ML SOLN, 13mL  OMNIPAQUE IOHEXOL 300 MG/ML SOLN  COMPARISON:  None.  FINDINGS: There is a 4 mm nodule in the right middle lobe. 4 mm nodule is also present in the right lower lobe. Subpleural opacities in the lung bases may reflect scarring, atelectasis, or possibly mild fibrosis. There is no pleural effusion.  The liver is diffusely low in attenuation, compatible with mild to moderate steatosis. Punctate calcifications in the liver and spleen likely reflect prior granulomatous infection. The gallbladder, adrenal glands, and pancreas have an unremarkable enhanced appearance. Small bilateral renal cyst and bilateral extrarenal pelves are noted.  There is a small hiatal hernia. There is a focal area of wall thickening involving the hepatic flexure of the colon measuring approximately 3 cm in length (series 2, image 34). The colon distal to this is decompressed. The more proximal colon is fluid-filled and dilated. There is also dilatation of multiple mid and distal small bowel loops, which are fluid-filled and measure up to 3.7 cm in diameter.  Moderate atherosclerotic aortic calcification is present. Uterus and ovaries are identified. No pelvic mass is seen. Bladder is unremarkable. No free fluid or enlarged lymph nodes are identified. Prior right total hip arthroplasty is noted. Multilevel degenerative disc disease is present in the lower thoracic and lumbar spine.  Left-greater-than-right facet arthrosis is present in the lower lumbar spine, greatest at L4-5.  IMPRESSION: 1. Focal wall thickening in the hepatic flexure of the colon with resultant bowel obstruction. Findings are concerning for primary colonic malignancy. 2. 4 mm nodules in the right lung base, indeterminate. 3. Hepatic steatosis. 4. Small hiatal hernia. These results were called by telephone at the time of interpretation on 07/06/2013 at 9:43 AM to Dr. Pattricia Boss , who verbally acknowledged these results.   Electronically Signed   By: Logan Bores   On: 07/06/2013  09:45   Dg Chest Port 1 View  07/06/2013   CLINICAL DATA:  Mid upper back pain for 1-2 weeks  EXAM: PORTABLE CHEST - 1 VIEW  COMPARISON:  None.  FINDINGS: The heart size and mediastinal contours are within normal limits. Both lungs are clear. Bilateral glenohumeral osteoarthritis.  IMPRESSION: No active disease.   Electronically Signed   By: Kathreen Devoid   On: 07/06/2013 10:41    Medications: . heparin  5,000 Units Subcutaneous 3 times per day  . zolpidem  5 mg Oral Once    Assessment/Plan 1. Colonic obstruction, likely malignant source  2. N/V secondary to #1  3. HTN  4. Hyperlipidemia   Plan:  Colonoscopy today, and then we plan resection of the colon.  LOS: 2 days    Earnstine Regal 07/08/2013

## 2013-07-08 NOTE — Transfer of Care (Signed)
Immediate Anesthesia Transfer of Care Note  Patient: [name]  Procedure(s) Performed: Procedure(s): COLONOSCOPY WITH PROPOFOL (N/A)  Patient Location: PACU and Endoscopy Unit  Anesthesia Type:MAC  Level of Consciousness: awake, oriented, patient cooperative, lethargic and responds to stimulation  Airway & Oxygen Therapy: Patient Spontanous Breathing and Patient connected to face mask oxygen  Post-op Assessment: Report given to PACU RN, Post -op Vital signs reviewed and stable and Patient moving all extremities  Post vital signs: Reviewed and stable  Complications: No apparent anesthesia complications

## 2013-07-08 NOTE — Progress Notes (Signed)
[facility] PROGRESS NOTE  [name] OEU:[id number] DOB: 11-06-1924 DOA: 07/06/2013 PCP: [name]  Assessment/Plan:  Active Problems:   Colonic mass - General surgery on board - Gastroenterology consulted and patient went for colonoscopy.  The mass was biopsied. -  Plan is for colectomy tomorrow. NG tube management per Gen Surg - Otherwise continue supportive therapy with Zofran and pain control with morphine    HTN (hypertension) -Relatively well controlled off of antihypertensive agents.  Last value 139/72  Code Status: Full Family Communication: Discussed with patient and daughter at bedside  Disposition Plan: Pending recommendations from specialist  Consultants:  Gastroenterology  General surgery  Procedures:  To be determined  Antibiotics:  None  HPI/Subjective: No new complaints. She understands her operation is tomorrow 07/09/13.   Objective: Filed Vitals:   07/08/13 1501  BP: 139/72  Pulse: 72  Temp: 98.6 F (37 C)  Resp: 16    Intake/Output Summary (Last 24 hours) at 07/08/13 1737 Last data filed at 07/08/13 1059  Gross per 24 hour  Intake    600 ml  Output    950 ml  Net   -350 ml   Filed Weights   07/06/13 0706 07/06/13 1209  Weight: 68.04 kg (150 lb) 67.631 kg (149 lb 1.6 oz)    Exam:   General: Patient in no acute distress, alert and awake  Cardiovascular: Regular rate and rhythm no murmurs rubs or gallops  Respiratory: Clear to auscultation, no wheezes or rales  Abdomen: Soft, nondistended, tenderness at left upper quadrant  Musculoskeletal: No cyanosis or clubbing  Data Reviewed: Basic Metabolic Panel:  Recent Labs Lab 07/06/13 0806 07/06/13 1305 07/07/13 0404  NA 136*  --  139  K 3.4*  --  3.7  CL 96  --  102  CO2 25  --  25  GLUCOSE 141*  --  149*  BUN 19  --  13  CREATININE 0.85 0.82 0.77  CALCIUM 10.1  --  9.2  MG  --  1.9  --   PHOS  --  4.1  --    Liver Function Tests:  Recent Labs Lab  07/06/13 0806 07/07/13 0404  AST 16 16  ALT 11 10  ALKPHOS 71 62  BILITOT 0.3 0.3  PROT 7.4 6.3  ALBUMIN 3.6 3.0*    Recent Labs Lab 07/06/13 0806  LIPASE 17   No results found for this basename: AMMONIA,  in the last 168 hours CBC:  Recent Labs Lab 07/06/13 0806 07/06/13 1305 07/07/13 0404  WBC 10.5 9.6 8.3  NEUTROABS 8.4*  --   --   HGB 12.2 11.7* 10.6*  HCT 37.1 35.5* 34.1*  MCV 79.1 79.6 81.8  PLT 318 323 313   Cardiac Enzymes: No results found for this basename: CKTOTAL, CKMB, CKMBINDEX, TROPONINI,  in the last 168 hours BNP (last 3 results) No results found for this basename: PROBNP,  in the last 8760 hours CBG: No results found for this basename: GLUCAP,  in the last 168 hours  Recent Results (from the past 240 hour(s))  SURGICAL PCR SCREEN     Status: None   Collection Time    07/08/13  3:31 PM      Result Value Ref Range Status   MRSA, PCR NEGATIVE  NEGATIVE Final   Staphylococcus aureus NEGATIVE  NEGATIVE Final   Comment:            The Xpert SA Assay (FDA     approved for NASAL specimens  in patients over 66 years of age),     is one component of     a comprehensive surveillance     program.  Test performance has     been validated by [employer] for patients greater     than or equal to 71 year old.     It is not intended     to diagnose infection nor to     guide or monitor treatment.     Studies: No results found.  Scheduled Meds: . famotidine (PEPCID) IV  20 mg Intravenous Q12H  . zolpidem  5 mg Oral Once   Continuous Infusions: . dextrose 5 % and 0.45 % NaCl with KCl 20 mEq/L 100 mL/hr at 07/08/13 1516    Time spent:> 35 minutes    [facility] Pager [phone] If 7PM-7AM, please contact night-coverage at [email or web address], password [facility] 07/08/2013, 5:37 PM  LOS: 2 days

## 2013-07-08 NOTE — H&P (Signed)
  The patient presents to the endoscopy unit for colonoscopy this morning. History reviewed. No change in physical exam from yesterday. For colonoscopy today to evaluate colon mass seen on CT scan.

## 2013-07-08 NOTE — Progress Notes (Signed)
General Surgery [facility]  Patient seen and examined.  Results of colonoscopy reviewed and discussed with patient and her son.  Plan to proceed with right colectomy tomorrow.  Unlikely that we will be able to perform primary anastomosis given complete obstruction and inability to prep patient.  Discussed likely need for ostomy with patient and her son.  Discussed post op course to be anticipated.  The risks and benefits of the procedure have been discussed at length with the patient.  The patient understands the proposed procedure, potential alternative treatments, and the course of recovery to be expected.  All of the patient's questions have been answered at this time.  The patient wishes to proceed with surgery.  Earnstine Regal, MD, [facility] Office: [phone]

## 2013-07-08 NOTE — Op Note (Signed)
[facility] [place], [address]   COLONOSCOPY PROCEDURE REPORT  PATIENT: Crystal, [name]  MR#: [id number] BIRTHDATE: [birth year] , 48  yrs. old GENDER: Female ENDOSCOPIST: Acquanetta Sit, MD REFERRED BY: PROCEDURE DATE:  07/08/2013 PROCEDURE:    colonoscopy with biopsy and [place] ink staining  ASA CLASS:    3 INDICATIONS: abnormal CT scan showing an obstructing lesion at the hepatic flexure MEDICATIONS:  propofol per anesthesia  DESCRIPTION OF PROCEDURE:   After the risks benefits and alternatives of the procedure were thoroughly explained, informed consent was obtained.   digital rectal exam was normal        The Pentax Ped Colon [id number]  endoscope was introduced through the anus and advanced to the  hepatic flexure.     . No adverse events experienced.   The quality of the prep was  poor do to [place] only being able to give enemas do to colonic obstruction.       The instrument was then slowly withdrawn as the colon was fully examined.  the scope was advanced  the area of the hepatic flexure.  there was a lot of solid stool that we had to go around. There were some diverticula noted. At the hepatic flexure there was an obstructing lesion and I could only see the distal portion of the and this is photographed in image 1. A biopsy was obtained. After this distal to the lesion I injected 3 cc of [place] ink to mark the lesion site.    The time to cecum=  .  Withdrawal time=  .  The scope was withdrawn and the procedure completed. COMPLICATIONS: There were no complications.  ENDOSCOPIC IMPRESSION:  #1. mass lesion at the region of the hepatic flexure as noted on CT scan. this is causing obstruction. This was biopsied and the area was injected with [place] 8 for localization.  RECOMMENDATIONS: proceed with surgery as planned by surgical team   eSigned:  Acquanetta Sit, MD 07/08/2013 10:56 AM   cc:

## 2013-07-09 ENCOUNTER — Encounter (HOSPITAL_COMMUNITY): Payer: Medicare Other | Admitting: Anesthesiology

## 2013-07-09 ENCOUNTER — Inpatient Hospital Stay (HOSPITAL_COMMUNITY): Payer: Medicare Other | Admitting: Anesthesiology

## 2013-07-09 ENCOUNTER — Encounter (HOSPITAL_COMMUNITY): Payer: Self-pay | Admitting: Registered Nurse

## 2013-07-09 ENCOUNTER — Encounter (HOSPITAL_COMMUNITY)
Admission: EM | Disposition: A | Discharge status: Home Health | Payer: Self-pay | Source: Home / Self Care | Attending: Family Medicine

## 2013-07-09 DIAGNOSIS — C189 Malignant neoplasm of colon, unspecified: Secondary | ICD-10-CM

## 2013-07-09 DIAGNOSIS — K56609 Unspecified intestinal obstruction, unspecified as to partial versus complete obstruction: Secondary | ICD-10-CM | POA: Diagnosis present

## 2013-07-09 HISTORY — PX: PARTIAL COLECTOMY: SHX5273

## 2013-07-09 SURGERY — COLECTOMY, PARTIAL
Anesthesia: General | Site: Abdomen | Laterality: Right

## 2013-07-09 MED ORDER — ONDANSETRON HCL 4 MG/2ML IJ SOLN
4.0000 mg | Freq: Four times a day (QID) | INTRAMUSCULAR | Status: DC | PRN
  Administered 2013-07-09 – 2013-07-10 (×2): 4 mg via INTRAVENOUS

## 2013-07-09 MED ORDER — GLYCOPYRROLATE 0.2 MG/ML IJ SOLN
INTRAMUSCULAR | Status: AC
  Filled 2013-07-09: qty 2

## 2013-07-09 MED ORDER — FENTANYL CITRATE 0.05 MG/ML IJ SOLN
INTRAMUSCULAR | Status: AC
  Filled 2013-07-09: qty 5

## 2013-07-09 MED ORDER — MORPHINE SULFATE (PF) 1 MG/ML IV SOLN
INTRAVENOUS | Status: DC
  Administered 2013-07-09: 17:00:00 via INTRAVENOUS
  Administered 2013-07-09: 18 mg via INTRAVENOUS
  Administered 2013-07-10: 2 mg via INTRAVENOUS
  Administered 2013-07-10: 5 mg via INTRAVENOUS
  Administered 2013-07-10: 4.65 mg via INTRAVENOUS
  Administered 2013-07-10: 0.01 mg via INTRAVENOUS
  Filled 2013-07-09: qty 25

## 2013-07-09 MED ORDER — HYDRALAZINE HCL 20 MG/ML IJ SOLN
INTRAMUSCULAR | Status: AC
  Filled 2013-07-09: qty 1

## 2013-07-09 MED ORDER — LABETALOL HCL 5 MG/ML IV SOLN
INTRAVENOUS | Status: AC
  Filled 2013-07-09: qty 4

## 2013-07-09 MED ORDER — 0.9 % SODIUM CHLORIDE (POUR BTL) OPTIME
TOPICAL | Status: DC | PRN
  Administered 2013-07-09: 2000 mL

## 2013-07-09 MED ORDER — PROMETHAZINE HCL 25 MG/ML IJ SOLN: 6.2500 mg | INTRAMUSCULAR | Status: DC | PRN

## 2013-07-09 MED ORDER — LACTATED RINGERS IV SOLN
INTRAVENOUS | Status: DC | PRN
  Administered 2013-07-09 (×2): via INTRAVENOUS

## 2013-07-09 MED ORDER — NEOSTIGMINE METHYLSULFATE 10 MG/10ML IV SOLN
INTRAVENOUS | Status: AC
  Filled 2013-07-09: qty 1

## 2013-07-09 MED ORDER — FENTANYL CITRATE 0.05 MG/ML IJ SOLN
INTRAMUSCULAR | Status: DC | PRN
  Administered 2013-07-09 (×2): 100 ug via INTRAVENOUS
  Administered 2013-07-09: 150 ug via INTRAVENOUS
  Administered 2013-07-09: 50 ug via INTRAVENOUS
  Administered 2013-07-09: 100 ug via INTRAVENOUS

## 2013-07-09 MED ORDER — SODIUM CHLORIDE 0.9 % IJ SOLN: 9.0000 mL | INTRAMUSCULAR | Status: DC | PRN

## 2013-07-09 MED ORDER — KETOROLAC TROMETHAMINE 30 MG/ML IJ SOLN: 15.0000 mg | Freq: Once | INTRAMUSCULAR | Status: DC | PRN

## 2013-07-09 MED ORDER — LABETALOL HCL 5 MG/ML IV SOLN
INTRAVENOUS | Status: DC | PRN
Start: 2013-07-09 — End: 2013-07-09
  Administered 2013-07-09: 5 mg via INTRAVENOUS
  Administered 2013-07-09 (×4): 2.5 mg via INTRAVENOUS

## 2013-07-09 MED ORDER — DIPHENHYDRAMINE HCL 12.5 MG/5ML PO ELIX: 12.5000 mg | ORAL_SOLUTION | Freq: Four times a day (QID) | ORAL | Status: DC | PRN

## 2013-07-09 MED ORDER — SODIUM CHLORIDE 0.9 % IJ SOLN
INTRAMUSCULAR | Status: AC
Start: 2013-07-09 — End: 2013-07-09
  Filled 2013-07-09: qty 10

## 2013-07-09 MED ORDER — NALOXONE HCL 0.4 MG/ML IJ SOLN
0.4000 mg | INTRAMUSCULAR | Status: DC | PRN
Start: 2013-07-09 — End: 2013-07-10

## 2013-07-09 MED ORDER — DIPHENHYDRAMINE HCL 50 MG/ML IJ SOLN
12.5000 mg | Freq: Four times a day (QID) | INTRAMUSCULAR | Status: DC | PRN
Start: 2013-07-09 — End: 2013-07-10

## 2013-07-09 MED ORDER — SODIUM CHLORIDE 0.9 % IV SOLN
INTRAVENOUS | Status: AC
  Filled 2013-07-09: qty 1

## 2013-07-09 MED ORDER — LIDOCAINE HCL (CARDIAC) 20 MG/ML IV SOLN
INTRAVENOUS | Status: AC
  Filled 2013-07-09: qty 5

## 2013-07-09 MED ORDER — MORPHINE SULFATE (PF) 1 MG/ML IV SOLN
INTRAVENOUS | Status: AC
  Filled 2013-07-09: qty 25

## 2013-07-09 MED ORDER — FENTANYL CITRATE 0.05 MG/ML IJ SOLN: 25.0000 ug | INTRAMUSCULAR | Status: DC | PRN

## 2013-07-09 MED ORDER — ONDANSETRON HCL 4 MG/2ML IJ SOLN
INTRAMUSCULAR | Status: DC | PRN
  Administered 2013-07-09: 4 mg via INTRAVENOUS

## 2013-07-09 MED ORDER — ONDANSETRON HCL 4 MG PO TABS: 4.0000 mg | ORAL_TABLET | Freq: Four times a day (QID) | ORAL | Status: DC | PRN

## 2013-07-09 MED ORDER — NEOSTIGMINE METHYLSULFATE 10 MG/10ML IV SOLN
INTRAVENOUS | Status: DC | PRN
  Administered 2013-07-09: 3 mg via INTRAVENOUS

## 2013-07-09 MED ORDER — ONDANSETRON HCL 4 MG/2ML IJ SOLN
INTRAMUSCULAR | Status: AC
  Filled 2013-07-09: qty 2

## 2013-07-09 MED ORDER — KCL IN DEXTROSE-NACL 20-5-0.45 MEQ/L-%-% IV SOLN
INTRAVENOUS | Status: DC
  Administered 2013-07-09 – 2013-07-12 (×3): via INTRAVENOUS
  Filled 2013-07-09 (×7): qty 1000

## 2013-07-09 MED ORDER — LIDOCAINE HCL (CARDIAC) 20 MG/ML IV SOLN
INTRAVENOUS | Status: DC | PRN
  Administered 2013-07-09: 25 mg via INTRATRACHEAL
  Administered 2013-07-09: 75 mg via INTRAVENOUS

## 2013-07-09 MED ORDER — HYDRALAZINE HCL 20 MG/ML IJ SOLN
INTRAMUSCULAR | Status: DC | PRN
  Administered 2013-07-09 (×2): 5 mg via INTRAVENOUS

## 2013-07-09 MED ORDER — PROPOFOL 10 MG/ML IV BOLUS
INTRAVENOUS | Status: AC
  Filled 2013-07-09: qty 20

## 2013-07-09 MED ORDER — ROCURONIUM BROMIDE 100 MG/10ML IV SOLN
INTRAVENOUS | Status: DC | PRN
  Administered 2013-07-09: 30 mg via INTRAVENOUS
  Administered 2013-07-09: 5 mg via INTRAVENOUS

## 2013-07-09 MED ORDER — HYDROMORPHONE HCL PF 1 MG/ML IJ SOLN: 0.2500 mg | INTRAMUSCULAR | Status: DC | PRN

## 2013-07-09 MED ORDER — PROPOFOL 10 MG/ML IV BOLUS
INTRAVENOUS | Status: DC | PRN
  Administered 2013-07-09: 140 mg via INTRAVENOUS
  Administered 2013-07-09: 40 mg via INTRAVENOUS
  Administered 2013-07-09: 20 mg via INTRAVENOUS

## 2013-07-09 MED ORDER — ENOXAPARIN SODIUM 40 MG/0.4ML ~~LOC~~ SOLN
40.0000 mg | SUBCUTANEOUS | Status: DC
  Administered 2013-07-10 – 2013-07-14 (×5): 40 mg via SUBCUTANEOUS
  Filled 2013-07-09 (×6): qty 0.4

## 2013-07-09 MED ORDER — GLYCOPYRROLATE 0.2 MG/ML IJ SOLN
INTRAMUSCULAR | Status: DC | PRN
  Administered 2013-07-09: .4 mg via INTRAVENOUS

## 2013-07-09 MED ORDER — ONDANSETRON HCL 4 MG/2ML IJ SOLN: 4.0000 mg | Freq: Four times a day (QID) | INTRAMUSCULAR | Status: DC | PRN

## 2013-07-09 MED ORDER — DEXAMETHASONE SODIUM PHOSPHATE 10 MG/ML IJ SOLN
INTRAMUSCULAR | Status: DC | PRN
  Administered 2013-07-09: 5 mg via INTRAVENOUS

## 2013-07-09 SURGICAL SUPPLY — 52 items
APPLICATOR COTTON TIP 6IN STRL (MISCELLANEOUS) IMPLANT
BAG UROSTOMY 4 LOOP 90 STRL (OSTOMY) ×2 IMPLANT
BAG UROSTOMY 4 LOOP 90MM STER (OSTOMY) ×1
BLADE EXTENDED COATED 6.5IN (ELECTRODE) ×3 IMPLANT
BLADE HEX COATED 2.75 (ELECTRODE) ×3 IMPLANT
BLADE SURG SZ10 CARB STEEL (BLADE) ×3 IMPLANT
CHLORAPREP W/TINT 26ML (MISCELLANEOUS) ×3 IMPLANT
COUNTER NEEDLE 20 DBL MAG RED (NEEDLE) IMPLANT
COVER MAYO STAND STRL (DRAPES) ×3 IMPLANT
DRAPE LAPAROSCOPIC ABDOMINAL (DRAPES) ×3 IMPLANT
DRAPE LG THREE QUARTER DISP (DRAPES) IMPLANT
DRAPE UTILITY XL STRL (DRAPES) ×6 IMPLANT
DRAPE WARM FLUID 44X44 (DRAPE) ×3 IMPLANT
DRSG OPSITE POSTOP 4X10 (GAUZE/BANDAGES/DRESSINGS) IMPLANT
DRSG OPSITE POSTOP 4X12 (GAUZE/BANDAGES/DRESSINGS) ×3 IMPLANT
DRSG OPSITE POSTOP 4X6 (GAUZE/BANDAGES/DRESSINGS) IMPLANT
DRSG OPSITE POSTOP 4X8 (GAUZE/BANDAGES/DRESSINGS) IMPLANT
DRSG PAD ABDOMINAL 8X10 ST (GAUZE/BANDAGES/DRESSINGS) IMPLANT
ELECT REM PT RETURN 9FT ADLT (ELECTROSURGICAL) ×3
ELECTRODE REM PT RTRN 9FT ADLT (ELECTROSURGICAL) ×1 IMPLANT
GLOVE SURG ORTHO 8.0 STRL STRW (GLOVE) ×6 IMPLANT
GOWN STRL REUS W/TWL XL LVL3 (GOWN DISPOSABLE) ×3 IMPLANT
KIT BASIN OR (CUSTOM PROCEDURE TRAY) ×3 IMPLANT
LEGGING LITHOTOMY PAIR STRL (DRAPES) ×3 IMPLANT
LIGASURE IMPACT 36 18CM CVD LR (INSTRUMENTS) ×3 IMPLANT
LUBRICANT JELLY K Y 4OZ (MISCELLANEOUS) IMPLANT
PACK GENERAL/GYN (CUSTOM PROCEDURE TRAY) ×3 IMPLANT
PENCIL BUTTON HOLSTER BLD 10FT (ELECTRODE) IMPLANT
RELOAD PROXIMATE 75MM BLUE (ENDOMECHANICALS) ×3 IMPLANT
SPONGE GAUZE 4X4 12PLY (GAUZE/BANDAGES/DRESSINGS) IMPLANT
STAPLER PROXIMATE 75MM BLUE (STAPLE) ×3 IMPLANT
STAPLER VISISTAT 35W (STAPLE) IMPLANT
SUCTION POOLE TIP (SUCTIONS) ×3 IMPLANT
SUT NOV 1 T60/GS (SUTURE) IMPLANT
SUT NOVA NAB DX-16 0-1 5-0 T12 (SUTURE) ×15 IMPLANT
SUT NOVA T20/GS 25 (SUTURE) IMPLANT
SUT PDS AB 1 TP1 96 (SUTURE) IMPLANT
SUT SILK 2 0 (SUTURE) ×4
SUT SILK 2 0 SH CR/8 (SUTURE) ×3 IMPLANT
SUT SILK 2 0SH CR/8 30 (SUTURE) IMPLANT
SUT SILK 2-0 18XBRD TIE 12 (SUTURE) ×1 IMPLANT
SUT SILK 2-0 30XBRD TIE 12 (SUTURE) ×1 IMPLANT
SUT SILK 3 0 (SUTURE) ×2
SUT SILK 3 0 SH CR/8 (SUTURE) ×3 IMPLANT
SUT SILK 3-0 18XBRD TIE 12 (SUTURE) ×1 IMPLANT
SUT VIC AB 3-0 SH 8-18 (SUTURE) ×3 IMPLANT
TOWEL OR 17X26 10 PK STRL BLUE (TOWEL DISPOSABLE) ×3 IMPLANT
TOWEL OR NON WOVEN STRL DISP B (DISPOSABLE) ×3 IMPLANT
TRAY FOLEY CATH 14FRSI W/METER (CATHETERS) ×3 IMPLANT
TUBING CONNECTING 10 (TUBING) IMPLANT
TUBING CONNECTING 10' (TUBING)
YANKAUER SUCT BULB TIP 10FT TU (MISCELLANEOUS) ×3 IMPLANT

## 2013-07-09 NOTE — Anesthesia Preprocedure Evaluation (Signed)
Anesthesia Evaluation  Patient identified by MRN, date of birth, ID band Patient awake  General Assessment Comment:.  IMPRESSION: 1. Focal wall thickening in the hepatic flexure of the colon with resultant bowel obstruction. Findings are concerning for primary colonic malignancy. 2. 4 mm nodules in the right lung base, indeterminate. 3. Hepatic steatosis.   Reviewed: Allergy & Precautions, H&P , NPO status , Patient's Chart, lab work & pertinent test results  Airway       Dental   Pulmonary neg pulmonary ROS,          Cardiovascular hypertension, Pt. on medications     Neuro/Psych negative neurological ROS  negative psych ROS   GI/Hepatic negative GI ROS, Neg liver ROS,   Endo/Other  negative endocrine ROS  Renal/GU negative Renal ROS  negative genitourinary   Musculoskeletal negative musculoskeletal ROS (+)   Abdominal   Peds negative pediatric ROS (+)  Hematology  (+) anemia ,   Anesthesia Other Findings   Reproductive/Obstetrics negative OB ROS                           Anesthesia Physical Anesthesia Plan  ASA: III  Anesthesia Plan: General   Post-op Pain Management:    Induction: Intravenous, Rapid sequence and Cricoid pressure planned  Airway Management Planned: Oral ETT  Additional Equipment:   Intra-op Plan:   Post-operative Plan: Extubation in OR  Informed Consent: I have reviewed the patients History and Physical, chart, labs and discussed the procedure including the risks, benefits and alternatives for the proposed anesthesia with the patient or authorized representative who has indicated his/her understanding and acceptance.   Dental advisory given  Plan Discussed with: CRNA and Surgeon  Anesthesia Plan Comments:         Anesthesia Quick Evaluation

## 2013-07-09 NOTE — Brief Op Note (Signed)
07/06/2013 - 07/09/2013  4:09 PM  PATIENT:  [name]  80 y.o. female  PRE-OPERATIVE DIAGNOSIS:  Colonic obstruction, suspect malignancy  POST-OPERATIVE DIAGNOSIS:  same  PROCEDURE:  Right hemicolectomy  SURGEON:  Surgeon(s) and Role:    * Earnstine Regal, MD - Primary  ANESTHESIA:   general  EBL:  Total I/O In: 1000 [I.V.:1000] Out: 375 [Urine:275; Blood:100]  BLOOD ADMINISTERED:none  DRAINS: none   LOCAL MEDICATIONS USED:  NONE  SPECIMEN:  Excision  DISPOSITION OF SPECIMEN:  PATHOLOGY  COUNTS:  YES  TOURNIQUET:  * No tourniquets in log *  DICTATION: .Other Dictation: Dictation Number [id number]  PLAN OF CARE: Admit to inpatient   PATIENT DISPOSITION:  PACU - hemodynamically stable.   Delay start of Pharmacological VTE agent (>24hrs) due to surgical blood loss or risk of bleeding: yes  Earnstine Regal, MD, [facility] Office: [phone]

## 2013-07-09 NOTE — Op Note (Signed)
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  PROCEDURE:  Right colectomy with end ileostomy.  SURGEON:  Earnstine Regal, MD, FACS  ANESTHESIA:  General per Dr. Myrtie Soman.  ESTIMATED BLOOD LOSS:  Minimal.  PREPARATION:  ChloraPrep.  COMPLICATIONS:  None.  INDICATIONS:  The patient is an 80 year old female admitted with signs and symptoms of colonic obstruction.  Evaluation included a CT scan, which shows a mass at the hepatic flexure.  There is near complete obstruction.  An attempt was made for gentle preparation and colonoscopy.  The tumor was visualized and the region tattooed, but the scope could not be advanced through the tumor.  The patient was therefore prepared and brought to the operating room for resection.  BODY OF REPORT:  Procedure was done in OR #1 at the [facility]  The patient was brought to the operating room, placed in supine position on the operating room table.  Following administration of general anesthesia, the patient was prepped and draped in the usual aseptic fashion.  After ascertaining that an adequate level of anesthesia had been achieved, a midline abdominal incision was made with a #10 blade.  Dissection was carried through subcutaneous tissues. Fascia was incised in the midline and the peritoneal cavity was entered cautiously.  Abdomen was explored.  There were no obvious hepatic metastases.  Gallbladder is distended but no palpable gallstones.  The stomach was normal and a nasogastric tube was properly positioned. Omentum and peritoneal  surfaces appeared free of gross disease.  Pelvis does not show any evidence of drop metastases.  The proximal colon and entire small bowel were moderately to markedly dilated.  There was a circumferential mass in the hepatic flexure, which is completely obstructing.  There is dry stool in the distal colon.  Balfour retractors placed for exposure.  The cecum was mobilized from its lateral peritoneal attachments.  Peritoneum was incised along the white line.  Hepatic flexure was taken down using the LigaSure. Dissection was carried approximately 15 cm distal to the obstructing tumor.  At this point, the bowel was transected with a GIA stapler.  The appendix and terminal ileum were also mobilized by incising the overlying peritoneum.  The mesentery was opened and the terminal ileum was transected with a GIA stapler.  Using the LigaSure, the mesentery of the right colon was divided.  Care was taken to avoid the duodenum.  The middle colic vessels were divided between Kelly clamps and ligated with 2-0 silk ties.  The right colic vessels were divided between Kelly clamps and ligated with 2-0 silk ties.  Specimen was passed off the field and submitted to Pathology for review.  Good hemostasis was noted throughout the peritoneal cavity.  Abdomen was irrigated with warm saline which was evacuated.  A  point in the mid right abdominal wall was selected.  There was a large nevus at this site.  This was excised circumferentially and submitted to pathology as a separate specimen.  A cruciate incision was made in the muscular layers of the abdominal wall.  The end of the ileum was brought through the abdominal wall to be matured as an ileostomy.  The bowel was secured to the fascia with interrupted 2-0 silk sutures.  These were all returned to the peritoneal cavity and the omentum was laid over the small bowel.  Midline incision was closed with interrupted #1 Novafil simple sutures.  Subcutaneous  tissues irrigated and hemostasis achieved with electrocautery.  Skin was closed with stainless steel staples.  Ileostomy was matured in the fashion of Brooke using interrupted 3-0 Vicryl sutures.  A sterile dressing was applied to the midline incision.  An ileostomy bag was applied at the site of the ostomy in the right mid abdominal wall.  The patient was awakened from anesthesia and brought to the recovery room.  The patient tolerated the procedure well.   Earnstine Regal, MD, [facility] Office: [phone]    TMG/MEDQ  D:  07/09/2013  T:  07/09/2013  Job:  [id number]  cc:   Wonda Horner, M.D. Fax: [phone]

## 2013-07-09 NOTE — Progress Notes (Signed)
[facility] PROGRESS NOTE  Vannary Greening NGE:[id number] DOB: 10-08-1924 DOA: 07/06/2013 PCP: Melinda Crutch  Assessment/Plan:  Active Problems:   Colonic mass - General surgery on board - Gastroenterology consulted and patient went for colonoscopy.  The mass was biopsied. -  Colectomy planned for today 07/09/13. NG tube management per Gen Surg - Otherwise continue supportive therapy with Zofran and pain control with morphine    HTN (hypertension) - If blood pressures remain persistently elevated will plan on adding blood pressure medication. During the post op time will hold unless blood pressures > 401 systolic or > 027 diastolic.  Code Status: Full Family Communication: Discussed with patient and daughter at bedside  Disposition Plan: Pending recommendations from specialist  Consultants:  Gastroenterology  General surgery  Procedures:  colectomy  Antibiotics:  None  HPI/Subjective: No new complaints. Pt looking forward to having mass removed.  Objective: Filed Vitals:   07/09/13 1613  BP: 124/106  Pulse: 68  Temp: 97.6 F (36.4 C)  Resp: 12    Intake/Output Summary (Last 24 hours) at 07/09/13 1632 Last data filed at 07/09/13 1615  Gross per 24 hour  Intake 8341.67 ml  Output   2175 ml  Net 6166.67 ml   Filed Weights   07/06/13 0706 07/06/13 1209  Weight: 68.04 kg (150 lb) 67.631 kg (149 lb 1.6 oz)    Exam:   General: Patient in no acute distress, alert and awake  Cardiovascular: Regular rate and rhythm no murmurs rubs or gallops  Respiratory: Clear to auscultation, no wheezes or rales  Abdomen: Soft, nondistended, tenderness at left upper quadrant  Musculoskeletal: No cyanosis or clubbing  Data Reviewed: Basic Metabolic Panel:  Recent Labs Lab 07/06/13 0806 07/06/13 1305 07/07/13 0404  NA 136*  --  139  K 3.4*  --  3.7  CL 96  --  102  CO2 25  --  25  GLUCOSE 141*  --  149*  BUN 19  --  13  CREATININE 0.85 0.82 0.77  CALCIUM  10.1  --  9.2  MG  --  1.9  --   PHOS  --  4.1  --    Liver Function Tests:  Recent Labs Lab 07/06/13 0806 07/07/13 0404  AST 16 16  ALT 11 10  ALKPHOS 71 62  BILITOT 0.3 0.3  PROT 7.4 6.3  ALBUMIN 3.6 3.0*    Recent Labs Lab 07/06/13 0806  LIPASE 17   No results found for this basename: AMMONIA,  in the last 168 hours CBC:  Recent Labs Lab 07/06/13 0806 07/06/13 1305 07/07/13 0404  WBC 10.5 9.6 8.3  NEUTROABS 8.4*  --   --   HGB 12.2 11.7* 10.6*  HCT 37.1 35.5* 34.1*  MCV 79.1 79.6 81.8  PLT 318 323 313   Cardiac Enzymes: No results found for this basename: CKTOTAL, CKMB, CKMBINDEX, TROPONINI,  in the last 168 hours BNP (last 3 results) No results found for this basename: PROBNP,  in the last 8760 hours CBG: No results found for this basename: GLUCAP,  in the last 168 hours  Recent Results (from the past 240 hour(s))  SURGICAL PCR SCREEN     Status: None   Collection Time    07/08/13  3:31 PM      Result Value Ref Range Status   MRSA, PCR NEGATIVE  NEGATIVE Final   Staphylococcus aureus NEGATIVE  NEGATIVE Final   Comment:            The Xpert SA  Assay (FDA     approved for NASAL specimens     in patients over 78 years of age),     is one component of     a comprehensive surveillance     program.  Test performance has     been validated by [employer] for patients greater     than or equal to 78 year old.     It is not intended     to diagnose infection nor to     guide or monitor treatment.     Studies: No results found.  Scheduled Meds: . ertapenem  1 g Intravenous On Call to OR  . [MAR HOLD] famotidine (PEPCID) IV  20 mg Intravenous Q12H  . [facility] HOLD] zolpidem  5 mg Oral Once   Continuous Infusions: . sodium chloride    . dextrose 5 % and 0.45 % NaCl with KCl 20 mEq/L 100 mL/hr at 07/09/13 1333  . lactated ringers      Time spent:> 35 minutes    [facility] Pager [phone] If 7PM-7AM, please contact  night-coverage at [email or web address], password [employer] 07/09/2013, 4:32 PM  LOS: 3 days

## 2013-07-09 NOTE — Anesthesia Postprocedure Evaluation (Signed)
  Anesthesia Post-op Note  Patient: [name]  Procedure(s) Performed: Procedure(s) (LRB): PARTIAL COLECTOMY AND EXCISION OF NEVUS FROM RIGHT ABDOMINAL WALL (Right)  Patient Location: PACU  Anesthesia Type: General  Level of Consciousness: awake and alert   Airway and Oxygen Therapy: Patient Spontanous Breathing  Post-op Pain: mild  Post-op Assessment: Post-op Vital signs reviewed, Patient's Cardiovascular Status Stable, Respiratory Function Stable, Patent Airway and No signs of Nausea or vomiting  Last Vitals:  Filed Vitals:   07/09/13 1644  BP:   Pulse:   Temp:   Resp: 8    Post-op Vital Signs: stable   Complications: No apparent anesthesia complications

## 2013-07-09 NOTE — Transfer of Care (Signed)
Immediate Anesthesia Transfer of Care Note  Patient: [name]  Procedure(s) Performed: Procedure(s): PARTIAL COLECTOMY AND EXCISION OF NEVUS FROM RIGHT ABDOMINAL WALL (Right)  Patient Location: PACU  Anesthesia Type:General  Level of Consciousness: awake, alert , patient cooperative and responds to stimulation  Airway & Oxygen Therapy: Patient Spontanous Breathing and Patient connected to face mask oxygen  Post-op Assessment: Report given to PACU RN, Post -op Vital signs reviewed and stable and Patient moving all extremities X 4  Post vital signs: stable  Complications: No apparent anesthesia complications

## 2013-07-10 DIAGNOSIS — R110 Nausea: Secondary | ICD-10-CM | POA: Diagnosis present

## 2013-07-10 LAB — BASIC METABOLIC PANEL
BUN: 6 mg/dL (ref 6–23)
CALCIUM: 8.3 mg/dL — AB (ref 8.4–10.5)
CO2: 23 meq/L (ref 19–32)
Chloride: 101 mEq/L (ref 96–112)
Creatinine, Ser: 0.67 mg/dL (ref 0.50–1.10)
GFR calc Af Amer: 89 mL/min — ABNORMAL LOW (ref >90)
GFR calc non Af Amer: 77 mL/min — ABNORMAL LOW (ref >90)
GLUCOSE: 166 mg/dL — AB (ref 70–99)
Potassium: 4.3 mEq/L (ref 3.7–5.3)
Sodium: 135 mEq/L — ABNORMAL LOW (ref 137–147)

## 2013-07-10 LAB — CBC
HCT: 30.4 % — ABNORMAL LOW (ref 36.0–46.0)
HEMOGLOBIN: 9.9 g/dL — AB (ref 12.0–15.0)
MCH: 26.2 pg (ref 26.0–34.0)
MCHC: 32.6 g/dL (ref 30.0–36.0)
MCV: 80.4 fL (ref 78.0–100.0)
PLATELETS: 290 10*3/uL (ref 150–400)
RBC: 3.78 MIL/uL — ABNORMAL LOW (ref 3.87–5.11)
RDW: 15.8 % — ABNORMAL HIGH (ref 11.5–15.5)
WBC: 12.8 10*3/uL — ABNORMAL HIGH (ref 4.0–10.5)

## 2013-07-10 MED ORDER — ONDANSETRON HCL 4 MG/2ML IJ SOLN
4.0000 mg | Freq: Once | INTRAMUSCULAR | Status: AC | PRN
  Administered 2013-07-10: 4 mg via INTRAVENOUS
  Filled 2013-07-10: qty 2

## 2013-07-10 MED ORDER — LORAZEPAM 2 MG/ML IJ SOLN
0.5000 mg | Freq: Once | INTRAMUSCULAR | Status: AC
  Administered 2013-07-10: 0.5 mg via INTRAVENOUS
  Filled 2013-07-10: qty 1

## 2013-07-10 NOTE — Progress Notes (Signed)
Entered pt room.  Pt handed this RN NGT.  On call provider notified and responded to reinsert NGT.  Pt tolerated well.  Placement verification by ausculation.  Zero gastric residual.  On call provider notified and responded to connect pt to low intermittent suction.  No new orders at this time. Will continue to monitor.

## 2013-07-10 NOTE — Progress Notes (Signed)
Patient ID: [name], female   DOB: [birth year], 80 y.o.   MRN: [id number]  General Surgery - [facility] - Progress Note  POD# 1  Subjective: Patient in bed, comfortable, mild nausea.  Son at bedside.  Pain well-controlled.  Objective: Vital signs in last 24 hours: Temp:  [97.6 F (36.4 C)-98.4 F (36.9 C)] 98.4 F (36.9 C) (05/03 0510) Pulse Rate:  [61-71] 67 (05/03 0510) Resp:  [8-16] 9 (05/03 0510) BP: (110-146)/(50-106) 110/50 mmHg (05/03 0510) SpO2:  [95 %-100 %] 96 % (05/03 0510) Last BM Date: 07/08/13  Intake/Output from previous day: 05/02 0701 - 05/03 0700 In: 3518.3 [I.V.:3518.3] Out: 1375 [Urine:1225; Stool:50; Blood:100]  Exam: HEENT - clear, not icteric Neck - soft Chest - clear bilaterally Cor - RRR, no murmur Abd - soft, midline incision clear and dry; stoma viable Ext - no significant edema Neuro - grossly intact, no focal deficits  Lab Results:   Recent Labs  07/10/13 0440  WBC 12.8*  HGB 9.9*  HCT 30.4*  PLT 290     Recent Labs  07/10/13 0440  NA 135*  K 4.3  CL 101  CO2 23  GLUCOSE 166*  BUN 6  CREATININE 0.67  CALCIUM 8.3*    Studies/Results: No results found.  Assessment / Plan: 1.  Status post right colectomy  Path pending  OOB to chair today  Encourage IS use  NPO, IV, NG decompression for recent obstruction  Earnstine Regal, MD, [facility] Office: [phone]  07/10/2013

## 2013-07-10 NOTE — Progress Notes (Signed)
[facility] PROGRESS NOTE  [name] JSH:[id number] DOB: [birth year] DOA: 07/06/2013 PCP: Melinda Crutch  Assessment/Plan:  Active Problems:   Colonic mass - General surgery on board - Gastroenterology consulted and patient went for colonoscopy (07/08/13).  The mass was biopsied. - Colectomy on 07/09/13. NG tube management per Gen Surg - Otherwise continue supportive therapy with Zofran and pain control with morphine.   Nausea - Zofran on board. Pt still c/o nausea. Will give an extra 1 time dose.    HTN (hypertension) - Will continue to hold antihypertensive regimen given soft blood pressures  Code Status: Full Family Communication: Discussed with patient and daughter at bedside  Disposition Plan: Pending recommendations from specialist  Consultants:  Gastroenterology  General surgery  Procedures:  colectomy  Antibiotics:  None  HPI/Subjective: Pts main concern is nausea today. Denies any emesis. No acute issues overnight reported.  Objective: Filed Vitals:   07/10/13 1209  BP:   Pulse:   Temp:   Resp: 13    Intake/Output Summary (Last 24 hours) at 07/10/13 1311 Last data filed at 07/10/13 0600  Gross per 24 hour  Intake 3518.33 ml  Output   1375 ml  Net 2143.33 ml   Filed Weights   07/06/13 0706 07/06/13 1209  Weight: 68.04 kg (150 lb) 67.631 kg (149 lb 1.6 oz)    Exam:   General: Patient in no acute distress, alert and awake  Cardiovascular: Regular rate and rhythm no murmurs rubs or gallops  Respiratory: Clear to auscultation, no wheezes or rales  Abdomen: Soft, nondistended, tenderness at left upper quadrant  Musculoskeletal: No cyanosis or clubbing  Data Reviewed: Basic Metabolic Panel:  Recent Labs Lab 07/06/13 0806 07/06/13 1305 07/07/13 0404 07/10/13 0440  NA 136*  --  139 135*  K 3.4*  --  3.7 4.3  CL 96  --  102 101  CO2 25  --  25 23  GLUCOSE 141*  --  149* 166*  BUN 19  --  13 6  CREATININE 0.85 0.82 0.77 0.67   CALCIUM 10.1  --  9.2 8.3*  MG  --  1.9  --   --   PHOS  --  4.1  --   --    Liver Function Tests:  Recent Labs Lab 07/06/13 0806 07/07/13 0404  AST 16 16  ALT 11 10  ALKPHOS 71 62  BILITOT 0.3 0.3  PROT 7.4 6.3  ALBUMIN 3.6 3.0*    Recent Labs Lab 07/06/13 0806  LIPASE 17   No results found for this basename: AMMONIA,  in the last 168 hours CBC:  Recent Labs Lab 07/06/13 0806 07/06/13 1305 07/07/13 0404 07/10/13 0440  WBC 10.5 9.6 8.3 12.8*  NEUTROABS 8.4*  --   --   --   HGB 12.2 11.7* 10.6* 9.9*  HCT 37.1 35.5* 34.1* 30.4*  MCV 79.1 79.6 81.8 80.4  PLT 318 323 313 290   Cardiac Enzymes: No results found for this basename: CKTOTAL, CKMB, CKMBINDEX, TROPONINI,  in the last 168 hours BNP (last 3 results) No results found for this basename: PROBNP,  in the last 8760 hours CBG: No results found for this basename: GLUCAP,  in the last 168 hours  Recent Results (from the past 240 hour(s))  SURGICAL PCR SCREEN     Status: None   Collection Time    07/08/13  3:31 PM      Result Value Ref Range Status   MRSA, PCR NEGATIVE  NEGATIVE Final  Staphylococcus aureus NEGATIVE  NEGATIVE Final   Comment:            The Xpert SA Assay (FDA     approved for NASAL specimens     in patients over 59 years of age),     is one component of     a comprehensive surveillance     program.  Test performance has     been validated by [employer] for patients greater     than or equal to 15 year old.     It is not intended     to diagnose infection nor to     guide or monitor treatment.     Studies: No results found.  Scheduled Meds: . enoxaparin (LOVENOX) injection  40 mg Subcutaneous Q24H  . famotidine (PEPCID) IV  20 mg Intravenous Q12H  . morphine   Intravenous 6 times per day  . zolpidem  5 mg Oral Once   Continuous Infusions: . dextrose 5 % and 0.45 % NaCl with KCl 20 mEq/L 100 mL/hr at 07/09/13 1333  . dextrose 5 % and 0.45 % NaCl with KCl 20 mEq/L 100  mL/hr at 07/10/13 0402    Time spent:> 35 minutes    [facility] Pager [phone] If 7PM-7AM, please contact night-coverage at [email or web address], password [facility] 07/10/2013, 1:11 PM  LOS: 4 days

## 2013-07-11 ENCOUNTER — Encounter (HOSPITAL_COMMUNITY): Payer: Self-pay | Admitting: Gastroenterology

## 2013-07-11 LAB — CBC
HCT: 29.5 % — ABNORMAL LOW (ref 36.0–46.0)
Hemoglobin: 9.8 g/dL — ABNORMAL LOW (ref 12.0–15.0)
MCH: 26.3 pg (ref 26.0–34.0)
MCHC: 33.2 g/dL (ref 30.0–36.0)
MCV: 79.1 fL (ref 78.0–100.0)
RBC: 3.73 MIL/uL — ABNORMAL LOW (ref 3.87–5.11)
RDW: 16.2 % — AB (ref 11.5–15.5)
WBC: 9 10*3/uL (ref 4.0–10.5)

## 2013-07-11 LAB — BASIC METABOLIC PANEL
BUN: 5 mg/dL — ABNORMAL LOW (ref 6–23)
CALCIUM: 8.6 mg/dL (ref 8.4–10.5)
CO2: 21 mEq/L (ref 19–32)
CREATININE: 0.66 mg/dL (ref 0.50–1.10)
Chloride: 102 mEq/L (ref 96–112)
GFR, EST AFRICAN AMERICAN: 89 mL/min — AB (ref >90)
GFR, EST NON AFRICAN AMERICAN: 77 mL/min — AB (ref >90)
Glucose, Bld: 128 mg/dL — ABNORMAL HIGH (ref 70–99)
Potassium: 4.1 mEq/L (ref 3.7–5.3)
Sodium: 135 mEq/L — ABNORMAL LOW (ref 137–147)

## 2013-07-11 MED ORDER — PHENOL 1.4 % MT LIQD
2.0000 | OROMUCOSAL | Status: DC | PRN
Start: 2013-07-11 — End: 2013-07-15

## 2013-07-11 MED ORDER — LIP MEDEX EX OINT
1.0000 "application " | TOPICAL_OINTMENT | Freq: Two times a day (BID) | CUTANEOUS | Status: DC
  Administered 2013-07-11 – 2013-07-15 (×8): 1 via TOPICAL
  Filled 2013-07-11: qty 7

## 2013-07-11 MED ORDER — LACTATED RINGERS IV BOLUS (SEPSIS): 1000.0000 mL | Freq: Three times a day (TID) | INTRAVENOUS | Status: AC | PRN

## 2013-07-11 MED ORDER — FENTANYL CITRATE 0.05 MG/ML IJ SOLN: 25.0000 ug | INTRAMUSCULAR | Status: DC | PRN

## 2013-07-11 MED ORDER — MAGIC MOUTHWASH
15.0000 mL | Freq: Four times a day (QID) | ORAL | Status: DC | PRN
  Filled 2013-07-11: qty 15

## 2013-07-11 MED ORDER — ALUM & MAG HYDROXIDE-SIMETH 200-200-20 MG/5ML PO SUSP: 30.0000 mL | Freq: Four times a day (QID) | ORAL | Status: DC | PRN

## 2013-07-11 MED ORDER — MENTHOL 3 MG MT LOZG
1.0000 | LOZENGE | OROMUCOSAL | Status: DC | PRN
  Filled 2013-07-11: qty 9

## 2013-07-11 MED ORDER — ACETAMINOPHEN 10 MG/ML IV SOLN
1000.0000 mg | Freq: Four times a day (QID) | INTRAVENOUS | Status: AC
  Administered 2013-07-11 – 2013-07-12 (×4): 1000 mg via INTRAVENOUS
  Filled 2013-07-11 (×4): qty 100

## 2013-07-11 MED ORDER — ACETAMINOPHEN 650 MG RE SUPP
650.0000 mg | Freq: Four times a day (QID) | RECTAL | Status: DC | PRN
Start: 2013-07-11 — End: 2013-07-12

## 2013-07-11 NOTE — Progress Notes (Signed)
2 Days Post-Op  Subjective: She got confused last PM and pulled the NG, she still seems confused to a degree this AM also. She is very sore and not really allowing them to give her much PO for pain  I dont see where she has anything for pain right now.  Objective: Vital signs in last 24 hours: Temp:  [98.4 F (36.9 C)-99.3 F (37.4 C)] 98.6 F (37 C) (05/04 0604) Pulse Rate:  [70-77] 77 (05/04 0604) Resp:  [11-18] 16 (05/04 0604) BP: (132-156)/(56-73) 151/73 mmHg (05/04 0604) SpO2:  [95 %-98 %] 97 % (05/04 0604) Last BM Date: 07/10/13 Afebrile, VSS Labs OK No films NPO 40 ml from the ostomy Intake/Output from previous day: 05/03 0701 - 05/04 0700 In: 4908.3 [I.V.:4908.3] Out: 2190 [Urine:2150; Stool:40] Intake/Output this shift:    General appearance: alert, cooperative and no distress Resp: clear to auscultation bilaterally GI: soft, very tender, dressing is intact, ostomy has some soft stool covering it.  No gas in the ostomy bag.  Waffle dressing looks fine.  Lab Results:   Recent Labs  07/10/13 0440 07/11/13 0335  WBC 12.8* 9.0  HGB 9.9* 9.8*  HCT 30.4* 29.5*  PLT 290 PLATELET CLUMPS NOTED ON SMEAR    BMET  Recent Labs  07/10/13 0440 07/11/13 0335  NA 135* 135*  K 4.3 4.1  CL 101 102  CO2 23 21  GLUCOSE 166* 128*  BUN 6 5*  CREATININE 0.67 0.66  CALCIUM 8.3* 8.6   PT/INR No results found for this basename: LABPROT, INR,  in the last 72 hours   Recent Labs Lab 07/06/13 0806 07/07/13 0404  AST 16 16  ALT 11 10  ALKPHOS 71 62  BILITOT 0.3 0.3  PROT 7.4 6.3  ALBUMIN 3.6 3.0*     Lipase     Component Value Date/Time   LIPASE 17 07/06/2013 0806     Studies/Results: No results found.  Medications: . enoxaparin (LOVENOX) injection  40 mg Subcutaneous Q24H  . famotidine (PEPCID) IV  20 mg Intravenous Q12H  . zolpidem  5 mg Oral Once    Assessment/Plan Colonic obstruction, likely carcinoma   (Pathology still pending) S/p Right  colectomy with end ileostomy.   07/09/13, Dr. Armandina Gemma S/p colonoscopy with biopsy and Niger ink staining, 07/08/13 Dr. Penelope Coop  HTN   Hyperlipidemia   Plan:  I will leave the NG out, she is still confused.  I have ask them to give her something for pain, I will add some IV tylenol start some sips and chips, start mobilizing her.  LOS: 5 days    Earnstine Regal 07/11/2013

## 2013-07-11 NOTE — Progress Notes (Signed)
Pt son concern mom was confused and antsy.  This RN entered pt room to assess and found NGT out of pt's nose.  Pt admitted to pulling NGT out.  On call provider notified. No new orders at this time. Will continue to monitor.

## 2013-07-11 NOTE — Progress Notes (Signed)
Neighbor in room Pt wants to eat Feeling better ?flatus in bag w pouch change Memory fair at times Morphine confuses her Does not like chair  Abd soft min distended.  No peritonitis Pt smiling, calm Ileostomy pink w min stool, no gas  -sips until flatus GET HER UP! PT/OT evals ?dementia?  ?safe to d/c home? Will follow  Adin Hector, M.D., F.A.C.S. Gastrointestinal and Minimally Invasive Surgery [facility] [address] [place], [place] [address] [phone] Main / Paging

## 2013-07-11 NOTE — Progress Notes (Signed)
[facility] PROGRESS NOTE  [name] OFB:[id number] DOB: 09-03-1924 DOA: 07/06/2013 PCP: Melinda Crutch  Assessment/Plan:  Active Problems:   Colonic mass - General surgery on board - Gastroenterology consulted and patient went for colonoscopy (07/08/13).  The mass was biopsied. - Colectomy on 07/09/13. NG tube removed by patient overnight. - Otherwise continue supportive therapy with Zofran - will defer pain medication regimen to [employer]. I suspect opiods may be causing some confusion and agree with IV tylenol.  Nausea - Zofran on board. Continue supportive therapy as needed.    HTN (hypertension) - Will continue to hold antihypertensive regimen.  Code Status: Full Family Communication: Discussed with patient and daughter at bedside  Disposition Plan: Pending recommendations from specialist  Consultants:  Gastroenterology  General surgery  Procedures:  colectomy  Antibiotics:  None  HPI/Subjective: Some confusion reported. Otherwise patient has no new complaints.  Objective: Filed Vitals:   07/11/13 1350  BP: 142/65  Pulse: 72  Temp: 99.6 F (37.6 C)  Resp: 16    Intake/Output Summary (Last 24 hours) at 07/11/13 1730 Last data filed at 07/11/13 1622  Gross per 24 hour  Intake 4908.33 ml  Output   3640 ml  Net 1268.33 ml   Filed Weights   07/06/13 0706 07/06/13 1209  Weight: 68.04 kg (150 lb) 67.631 kg (149 lb 1.6 oz)    Exam:   General: Patient in no acute distress, alert and awake, non toxic  Cardiovascular: Regular rate and rhythm no murmurs rubs or gallops  Respiratory: Clear to auscultation, no wheezes or rales, no increased wob  Abdomen: Soft, nondistended, incision intact with no cellulitis or active bleeding.  Musculoskeletal: No cyanosis or clubbing  Data Reviewed: Basic Metabolic Panel:  Recent Labs Lab 07/06/13 0806 07/06/13 1305 07/07/13 0404 07/10/13 0440 07/11/13 0335  NA 136*  --  139 135* 135*  K 3.4*  --  3.7  4.3 4.1  CL 96  --  102 101 102  CO2 25  --  25 23 21   GLUCOSE 141*  --  149* 166* 128*  BUN 19  --  13 6 5*  CREATININE 0.85 0.82 0.77 0.67 0.66  CALCIUM 10.1  --  9.2 8.3* 8.6  MG  --  1.9  --   --   --   PHOS  --  4.1  --   --   --    Liver Function Tests:  Recent Labs Lab 07/06/13 0806 07/07/13 0404  AST 16 16  ALT 11 10  ALKPHOS 71 62  BILITOT 0.3 0.3  PROT 7.4 6.3  ALBUMIN 3.6 3.0*    Recent Labs Lab 07/06/13 0806  LIPASE 17   No results found for this basename: AMMONIA,  in the last 168 hours CBC:  Recent Labs Lab 07/06/13 0806 07/06/13 1305 07/07/13 0404 07/10/13 0440 07/11/13 0335  WBC 10.5 9.6 8.3 12.8* 9.0  NEUTROABS 8.4*  --   --   --   --   HGB 12.2 11.7* 10.6* 9.9* 9.8*  HCT 37.1 35.5* 34.1* 30.4* 29.5*  MCV 79.1 79.6 81.8 80.4 79.1  PLT 318 323 313 290 PLATELET CLUMPS NOTED ON SMEAR   Cardiac Enzymes: No results found for this basename: CKTOTAL, CKMB, CKMBINDEX, TROPONINI,  in the last 168 hours BNP (last 3 results) No results found for this basename: PROBNP,  in the last 8760 hours CBG: No results found for this basename: GLUCAP,  in the last 168 hours  Recent Results (from the past 240  hour(s))  SURGICAL PCR SCREEN     Status: None   Collection Time    07/08/13  3:31 PM      Result Value Ref Range Status   MRSA, PCR NEGATIVE  NEGATIVE Final   Staphylococcus aureus NEGATIVE  NEGATIVE Final   Comment:            The Xpert SA Assay (FDA     approved for NASAL specimens     in patients over 61 years of age),     is one component of     a comprehensive surveillance     program.  Test performance has     been validated by [employer] for patients greater     than or equal to 78 year old.     It is not intended     to diagnose infection nor to     guide or monitor treatment.     Studies: No results found.  Scheduled Meds: . acetaminophen  1,000 mg Intravenous 4 times per day  . enoxaparin (LOVENOX) injection  40 mg  Subcutaneous Q24H  . famotidine (PEPCID) IV  20 mg Intravenous Q12H  . lip balm  1 application Topical BID  . zolpidem  5 mg Oral Once   Continuous Infusions: . dextrose 5 % and 0.45 % NaCl with KCl 20 mEq/L 50 mL/hr at 07/11/13 1439    Time spent:> 35 minutes    [facility] Pager [phone] If 7PM-7AM, please contact night-coverage at [email or web address], password [facility] 07/11/2013, 5:30 PM  LOS: 5 days

## 2013-07-11 NOTE — Consult Note (Addendum)
WOC ostomy consult note Stoma type/location: RLQ Ileostomy Stomal assessment/size: 1 and 5/8 inch pink, round, budded, edematous, moist with os at center Peristomal assessment: intact, clear Treatment options for stomal/peristomal skin: None indicated Output Soft, light brown stool Ostomy pouching: 2pc. Flat pouching system, no skin barrier ring required. Education provided: Patient asking questions regarding whether or not she would want to be reconnected at first meeting.  General education provided pertaining to stoma characteristics, pouch characteristics, activity and diet.  Patient taught that some people seek reconnection and others do not; patient observed the removal of the previous pouching system, the sizing of her stoma and the tracing and cutting out of the new skin barrier.  Observed placement of skin barrier and affixing of pouch to skin barrier.  Her abdomen is still tender and she looked away several times.  Patient does not yet have a good understanding of reason she had surgery even after my explaining a "blockage" several times.  She remarked that she often went several days without a bowel movement and thought she just was constipated and needed laxatives.  Patient is reminiscing at intervals during our visit.  Able to provide a return demonstration of the "Lock and Roll" Pouch Closure mechanism x2. Ostomy education booklet provided and I have written on the back of it the difference between emptying and changing her pouch.  Will require supportive continuing education post discharge, perhaps HHA or perhaps a short stay in a Rehab facility as she lives alone. Patient is established with [facility] today with her permission for ostomy trial products that are appropriate for her. Supplies in room. [facility] nursing team will follow, and will remain available to this patient, the nursing, the surgical and medical teams.  Please re-consult if needed. Thanks, Maudie Flakes, MSN, RN, [place],  [place], [place] [phone])

## 2013-07-11 NOTE — Progress Notes (Signed)
Attempted to reinsert NGT. Pt gagged repeatedly.  Pt did not tolerate and refused.  Will pass on to oncoming shift.

## 2013-07-12 ENCOUNTER — Encounter (HOSPITAL_COMMUNITY): Payer: Self-pay | Admitting: Surgery

## 2013-07-12 MED ORDER — BISMUTH SUBSALICYLATE 262 MG/15ML PO SUSP
30.0000 mL | Freq: Three times a day (TID) | ORAL | Status: DC | PRN
  Filled 2013-07-12: qty 236

## 2013-07-12 MED ORDER — FENTANYL CITRATE 0.05 MG/ML IJ SOLN: 25.0000 ug | INTRAMUSCULAR | Status: DC | PRN

## 2013-07-12 MED ORDER — OXYCODONE-ACETAMINOPHEN 5-325 MG PO TABS
1.0000 | ORAL_TABLET | ORAL | Status: DC | PRN
  Administered 2013-07-13: 1 via ORAL
  Filled 2013-07-12: qty 1

## 2013-07-12 MED ORDER — ACETAMINOPHEN 325 MG PO TABS: 650.0000 mg | ORAL_TABLET | Freq: Four times a day (QID) | ORAL | Status: DC | PRN

## 2013-07-12 MED ORDER — KCL IN DEXTROSE-NACL 40-5-0.9 MEQ/L-%-% IV SOLN
INTRAVENOUS | Status: DC
  Administered 2013-07-12 (×2): via INTRAVENOUS
  Filled 2013-07-12 (×4): qty 1000

## 2013-07-12 MED ORDER — LOPERAMIDE HCL 2 MG PO CAPS: 2.0000 mg | ORAL_CAPSULE | Freq: Three times a day (TID) | ORAL | Status: DC | PRN

## 2013-07-12 NOTE — Care Management Note (Addendum)
Cm spoke with patient at the bedside concerning discharge planning. Pt states resides home alone. Pt states adult son and daughter to assist with home care. Pt states having RW for home dme use. No further equipment requested. Pt request HHRN and aide to assist with home care. Pt declines SNF placement if recommended. Pt offered choice of [facility] agencies in [place]. Per pt choice [facility] to provide [facility] services upon dc. [facility] Go rep Kirke Corin notified.      Venita Lick Joyel Chenette,MSN,RN [phone]

## 2013-07-12 NOTE — Progress Notes (Signed)
[facility] PROGRESS NOTE  [name] JYN:[id number] DOB: [birth year] DOA: 07/06/2013 PCP: Melinda Crutch Brief narrative: 80 year old Caucasian female who presented to the ED complaining of abdominal discomfort of 2 weeks' duration. During initial workup in the ED had CT scan which showed new colonic mass. Patient was evaluated by GI who performed colonoscopy with biopsies. General surgery was subsequently consulted who performed recent colectomy.  Assessment/Plan:  Active Problems:   Colonic mass - General surgery on board - Gastroenterology consulted and patient went for colonoscopy (07/08/13).  The mass was biopsied. - Colectomy on 07/09/13. - Otherwise continue supportive therapy with Zofran - Patient's altered mental status resolved and was most likely secondary to post anesthesia medication as well as combination of opioids. Patient reports pain is relatively well controlled on current regimen.  Nausea - Zofran on board. Continue supportive therapy as needed.    HTN (hypertension) - Will continue to hold antihypertensive regimen.  Code Status: Full Family Communication: Discussed with patient and daughter at bedside  Disposition Plan: Pending recommendations from specialist  Consultants:  Gastroenterology  General surgery  Procedures:  colectomy  Antibiotics:  None  HPI/Subjective: No acute issues or new complaints reported.   Objective: Filed Vitals:   07/12/13 1250  BP: 141/65  Pulse: 73  Temp: 98.3 F (36.8 C)  Resp: 18    Intake/Output Summary (Last 24 hours) at 07/12/13 1521 Last data filed at 07/12/13 1438  Gross per 24 hour  Intake    840 ml  Output   2225 ml  Net  -1385 ml   Filed Weights   07/06/13 0706 07/06/13 1209  Weight: 68.04 kg (150 lb) 67.631 kg (149 lb 1.6 oz)    Exam:   General: Patient in no acute distress, alert and awake, non toxic  Cardiovascular: Regular rate and rhythm no murmurs rubs or gallops  Respiratory:  Clear to auscultation, no wheezes or rales, no increased wob  Abdomen: Soft, nondistended, incision intact with no cellulitis or active bleeding.  Musculoskeletal: No cyanosis or clubbing  Data Reviewed: Basic Metabolic Panel:  Recent Labs Lab 07/06/13 0806 07/06/13 1305 07/07/13 0404 07/10/13 0440 07/11/13 0335  NA 136*  --  139 135* 135*  K 3.4*  --  3.7 4.3 4.1  CL 96  --  102 101 102  CO2 25  --  25 23 21   GLUCOSE 141*  --  149* 166* 128*  BUN 19  --  13 6 5*  CREATININE 0.85 0.82 0.77 0.67 0.66  CALCIUM 10.1  --  9.2 8.3* 8.6  MG  --  1.9  --   --   --   PHOS  --  4.1  --   --   --    Liver Function Tests:  Recent Labs Lab 07/06/13 0806 07/07/13 0404  AST 16 16  ALT 11 10  ALKPHOS 71 62  BILITOT 0.3 0.3  PROT 7.4 6.3  ALBUMIN 3.6 3.0*    Recent Labs Lab 07/06/13 0806  LIPASE 17   No results found for this basename: AMMONIA,  in the last 168 hours CBC:  Recent Labs Lab 07/06/13 0806 07/06/13 1305 07/07/13 0404 07/10/13 0440 07/11/13 0335  WBC 10.5 9.6 8.3 12.8* 9.0  NEUTROABS 8.4*  --   --   --   --   HGB 12.2 11.7* 10.6* 9.9* 9.8*  HCT 37.1 35.5* 34.1* 30.4* 29.5*  MCV 79.1 79.6 81.8 80.4 79.1  PLT 318 323 313 290 PLATELET CLUMPS NOTED ON SMEAR  Cardiac Enzymes: No results found for this basename: CKTOTAL, CKMB, CKMBINDEX, TROPONINI,  in the last 168 hours BNP (last 3 results) No results found for this basename: PROBNP,  in the last 8760 hours CBG: No results found for this basename: GLUCAP,  in the last 168 hours  Recent Results (from the past 240 hour(s))  SURGICAL PCR SCREEN     Status: None   Collection Time    07/08/13  3:31 PM      Result Value Ref Range Status   MRSA, PCR NEGATIVE  NEGATIVE Final   Staphylococcus aureus NEGATIVE  NEGATIVE Final   Comment:            The Xpert SA Assay (FDA     approved for NASAL specimens     in patients over 69 years of age),     is one component of     a comprehensive surveillance      program.  Test performance has     been validated by [employer] for patients greater     than or equal to 62 year old.     It is not intended     to diagnose infection nor to     guide or monitor treatment.     Studies: No results found.  Scheduled Meds: . enoxaparin (LOVENOX) injection  40 mg Subcutaneous Q24H  . famotidine (PEPCID) IV  20 mg Intravenous Q12H  . lip balm  1 application Topical BID  . zolpidem  5 mg Oral Once   Continuous Infusions: . dextrose 5 % and 0.9 % NaCl with KCl 40 mEq/L 75 mL/hr at 07/12/13 0831    Time spent:> 35 minutes    [facility] Pager [phone] If 7PM-7AM, please contact night-coverage at [email or web address], password [facility] 07/12/2013, 3:21 PM  LOS: 6 days

## 2013-07-12 NOTE — Progress Notes (Signed)
Adv diet Ostomy training Mobilize more F/u path

## 2013-07-12 NOTE — Evaluation (Signed)
Physical Therapy Evaluation Patient Details Name: [name] MRN: [id number] DOB: 1924-09-13 Today's Date: 07/12/2013   History of Present Illness  Pt is an 80 year old female s/p right colectomy with end ileostomy on 5/2.  Clinical Impression  Pt currently with functional limitations due to the deficits listed below (see PT Problem List).  Pt will benefit from skilled PT to increase their independence and safety with mobility to allow discharge to the venue listed below.  Pt agreeable to ambulate and utilized RW to steady due to pt reporting increased weakness.  Pt declines SNF per CM note however pt likely to progress to HHPT upon d/c from a mobility standpoint.       Follow Up Recommendations Home health PT;Other (comment) (declines SNF per CM note)    Equipment Recommendations  None recommended by PT    Recommendations for Other Services       Precautions / Restrictions Precautions Precaution Comments: ileostomy      Mobility  Bed Mobility               General bed mobility comments: up in recliner on arrival  Transfers Overall transfer level: Needs assistance Equipment used: Rolling walker (2 wheeled) Transfers: Sit to/from Stand Sit to Stand: Min guard         General transfer comment: min/guard for lines safety  Ambulation/Gait Ambulation/Gait assistance: Min guard Ambulation Distance (Feet): 80 Feet Assistive device: Rolling walker (2 wheeled) Gait Pattern/deviations: Step-through pattern Gait velocity: decr   General Gait Details: pt only c/o LE weakness specifically her knees, wishes to increase distance next time ambulating  Stairs            Wheelchair Mobility    Modified Rankin (Stroke Patients Only)       Balance                                             Pertinent Vitals/Pain Pt reports sore ostomy site however activity to tolerance    Home Living Family/patient expects to be discharged to:: Private  residence Living Arrangements: Alone Available Help at Discharge: Neighbor;Available PRN/intermittently Type of Home: House Home Access: Level entry     Home Layout: One level Home Equipment: Walker - 2 wheels      Prior Function Level of Independence: Independent               Hand Dominance        Extremity/Trunk Assessment               Lower Extremity Assessment: Generalized weakness         Communication   Communication: No difficulties  Cognition Arousal/Alertness: Awake/alert Behavior During Therapy: WFL for tasks assessed/performed Overall Cognitive Status: Within Functional Limits for tasks assessed (confusion per chart review yesterday however no impairments observed during session)                      General Comments      Exercises        Assessment/Plan    PT Assessment Patient needs continued PT services  PT Diagnosis Difficulty walking   PT Problem List Decreased strength;Decreased activity tolerance;Decreased mobility  PT Treatment Interventions DME instruction;Gait training;Functional mobility training;Therapeutic exercise;Therapeutic activities;Patient/family education   PT Goals (Current goals can be found in the Care Plan section) Acute Rehab PT Goals PT Goal  Formulation: With patient Time For Goal Achievement: 07/19/13 Potential to Achieve Goals: Good    Frequency Min 3X/week   Barriers to discharge        Co-evaluation               End of Session   Activity Tolerance: Patient tolerated treatment well Patient left: in chair;with call bell/phone within reach Nurse Communication: Mobility status         Time: 9767-3419 PT Time Calculation (min): 10 min   Charges:   PT Evaluation $Initial PT Evaluation Tier I: 1 Procedure PT Treatments $Gait Training: 8-22 mins   PT G CodesJunius Argyle 07/12/2013, 12:42 PM Carmelia Bake, PT, DPT 07/12/2013 Pager: [phone]

## 2013-07-12 NOTE — Evaluation (Signed)
Occupational Therapy Evaluation Patient Details Name: [name] MRN: [id number] DOB: 13-Nov-1924 Today's Date: 07/12/2013    History of Present Illness Pt is an 80 year old female s/p right colectomy with end ileostomy on 5/2.   Clinical Impression   Pt is s/p R colectomy with end ileostomy.   Pt was independent prior to admission.  At this time, she needs min A to get OOB and overall min guard for adls.  She will benefit from skilled OT to increase safety and independence with adls, with supervision level goals in acute.     Follow Up Recommendations   (would benefit from SNF but refuses; [facility], initial 24/7 recommended)    Equipment Recommendations  None recommended by OT    Recommendations for Other Services       Precautions / Restrictions Precautions Precaution Comments: ileostomy Restrictions Weight Bearing Restrictions: No      Mobility Bed Mobility Overal bed mobility: Needs Assistance Bed Mobility: Rolling;Sidelying to Sit Rolling: Modified independent (Device/Increase time) Sidelying to sit: Min assist       General bed mobility comments: used rail for rolling; did not lie to side when returning to bed; cued for this for decreased abdominal stress  Transfers Overall transfer level: Needs assistance Equipment used: Rolling walker (2 wheeled) Transfers: Sit to/from Stand Sit to Stand: Min guard         General transfer comment: for safety    Balance                                            ADL Overall ADL's : Needs assistance/impaired             Lower Body Bathing: Minimal guard;With adaptive equipment;Sit to/from stand       Lower Body Dressing: Minimal assistance;Sit to/from stand;With adaptive equipment               Functional mobility during ADLs: Minimal assistance General ADL Comments: Pt has AE from when she had hip surgery.  Reviewed positioning for sock aid.  Pt needed min A for bed mobility; cued for  sidelying to decrease abdominal stress.  Pt fatiqued from walking to bathroom earlier.  She does get up in the night to use bathroom.  will need to be independent with bed mobility.  Pt can perform UB adls with set up.       Vision                     Perception     Praxis      Pertinent Vitals/Pain Abdomen sore; repositioned     Hand Dominance     Extremity/Trunk Assessment Upper Extremity Assessment Upper Extremity Assessment: Overall WFL for tasks assessed (shoulder arthritis)          Communication Communication Communication: No difficulties   Cognition Arousal/Alertness: Awake/alert Behavior During Therapy: WFL for tasks assessed/performed Overall Cognitive Status: Within Functional Limits for tasks assessed                     General Comments       Exercises       Shoulder Instructions      Home Living Family/patient expects to be discharged to:: Private residence Living Arrangements: Alone Available Help at Discharge: Neighbor;Available PRN/intermittently Type of Home: House Home Access: Level entry     Home  Layout: One level     Bathroom Shower/Tub: Occupational psychologist: Handicapped height     Home Equipment: Environmental consultant - 2 wheels;Grab bars - tub/shower          Prior Functioning/Environment Level of Independence: Independent        Comments: has     OT Diagnosis: Generalized weakness   OT Problem List: Decreased strength;Decreased activity tolerance;Decreased knowledge of use of DME or AE;Pain   OT Treatment/Interventions: Self-care/ADL training;DME and/or AE instruction;Patient/family education    OT Goals(Current goals can be found in the care plan section) Acute Rehab OT Goals Patient Stated Goal: home OT Goal Formulation: With patient Time For Goal Achievement: 07/26/13 Potential to Achieve Goals: Good ADL Goals Pt Will Transfer to Toilet: ambulating (comfort height) Pt Will Perform Toileting -  Clothing Manipulation and hygiene: with supervision;sit to/from stand Additional ADL Goal #1: Pt will perform bed mobility at supervision level from flat bed in preparation for toilet transfers Additional ADL Goal #2: Pt will gather clothes at supervision level and complete adls at this level initiating at least one rest break for energy conservation  OT Frequency: Min 2X/week   Barriers to D/C:            Co-evaluation              End of Session    Activity Tolerance: Patient limited by fatigue Patient left: in bed;with call bell/phone within reach   Time: 1445-1457 OT Time Calculation (min): 12 min Charges:  OT General Charges $OT Visit: 1 Procedure OT Evaluation $Initial OT Evaluation Tier I: 1 Procedure OT Treatments $Self Care/Home Management : 8-22 mins G-Codes:    Lesle Chris 07/15/13, 3:27 PM  Lesle Chris, OTR/L [phone] 2013-07-15

## 2013-07-12 NOTE — Progress Notes (Signed)
3 Days Post-Op  Subjective: She is quite happy with clears, wanted to know if she had to take it all.  She answers questions well, and can repeat the news easily to me.  I don't think she is as confused as yesterday.    Objective: Vital signs in last 24 hours: Temp:  [97.9 F (36.6 C)-99.6 F (37.6 C)] 97.9 F (36.6 C) (05/05 0545) Pulse Rate:  [69-73] 73 (05/05 0545) Resp:  [12-16] 12 (05/05 0545) BP: (139-149)/(65-70) 149/70 mmHg (05/05 0545) SpO2:  [96 %-97 %] 96 % (05/05 0545) Last BM Date: 07/10/13 1050 from ileostomy reported Diet :  Ice chips Afebrile, VSS Labs OK Intake/Output from previous day: 05/04 0701 - 05/05 0700 In: -  Out: 2750 [Urine:1700; Stool:1050] Intake/Output this shift: Total I/O In: -  Out: 1300 [Urine:600; Stool:700]  General appearance: alert, cooperative and no distress Resp: clear to auscultation bilaterally GI: soft sore, good bowel sounds ileostomy is working well  Lab Results:   Recent Labs  07/10/13 0440 07/11/13 0335  WBC 12.8* 9.0  HGB 9.9* 9.8*  HCT 30.4* 29.5*  PLT 290 PLATELET CLUMPS NOTED ON SMEAR    BMET  Recent Labs  07/10/13 0440 07/11/13 0335  NA 135* 135*  K 4.3 4.1  CL 101 102  CO2 23 21  GLUCOSE 166* 128*  BUN 6 5*  CREATININE 0.67 0.66  CALCIUM 8.3* 8.6   PT/INR No results found for this basename: LABPROT, INR,  in the last 72 hours   Recent Labs Lab 07/06/13 0806 07/07/13 0404  AST 16 16  ALT 11 10  ALKPHOS 71 62  BILITOT 0.3 0.3  PROT 7.4 6.3  ALBUMIN 3.6 3.0*     Lipase     Component Value Date/Time   LIPASE 17 07/06/2013 0806     Studies/Results: No results found.  Medications: . enoxaparin (LOVENOX) injection  40 mg Subcutaneous Q24H  . famotidine (PEPCID) IV  20 mg Intravenous Q12H  . lip balm  1 application Topical BID  . zolpidem  5 mg Oral Once   Diagnosis Colon, biopsy - INVASIVE POORLY DIFFERENTIATED ADENOCARCINOMA. Microscopic Comment The case was discussed with Dr.  Penelope Coop on 07/10/13. (HCL:gt, 07/11/13) Assessment/Plan Colonic obstruction, likely carcinoma (Pathology still pending)  S/p Right colectomy with end ileostomy. 07/09/13, Dr. Armandina Gemma  S/p colonoscopy with biopsy and Niger ink staining, 07/08/13 Dr. Penelope Coop  HTN  Hyperlipidemia Confusion post op (better today)   Plan:  I started her on clears this Am with output from ileostomy data.  I think we can start mobilizing her more.  We will need to keep an eye on her I/O and watch that output does not exceed intake.  I will ask nutrition to see and start her on some PO pain medicines also.  Na is down some so I am going to change her IV fluids to D5NS+KCL.  Path from colon bx above.    LOS: 6 days    [name] 07/12/2013

## 2013-07-13 ENCOUNTER — Other Ambulatory Visit (INDEPENDENT_AMBULATORY_CARE_PROVIDER_SITE_OTHER): Payer: Self-pay

## 2013-07-13 DIAGNOSIS — E785 Hyperlipidemia, unspecified: Secondary | ICD-10-CM

## 2013-07-13 DIAGNOSIS — R110 Nausea: Secondary | ICD-10-CM

## 2013-07-13 DIAGNOSIS — C189 Malignant neoplasm of colon, unspecified: Secondary | ICD-10-CM

## 2013-07-13 DIAGNOSIS — I10 Essential (primary) hypertension: Secondary | ICD-10-CM

## 2013-07-13 LAB — BASIC METABOLIC PANEL
BUN: 6 mg/dL (ref 6–23)
CO2: 20 mEq/L (ref 19–32)
Calcium: 8.9 mg/dL (ref 8.4–10.5)
Chloride: 107 mEq/L (ref 96–112)
Creatinine, Ser: 0.64 mg/dL (ref 0.50–1.10)
GFR calc Af Amer: >90 mL/min (ref >90)
GFR, EST NON AFRICAN AMERICAN: 78 mL/min — AB (ref >90)
Glucose, Bld: 117 mg/dL — ABNORMAL HIGH (ref 70–99)
Potassium: 4.2 mEq/L (ref 3.7–5.3)
SODIUM: 138 meq/L (ref 137–147)

## 2013-07-13 MED ORDER — LACTATED RINGERS IV BOLUS (SEPSIS): 1000.0000 mL | Freq: Three times a day (TID) | INTRAVENOUS | Status: AC | PRN

## 2013-07-13 MED ORDER — LOPERAMIDE HCL 2 MG PO CAPS: 2.0000 mg | ORAL_CAPSULE | Freq: Three times a day (TID) | ORAL | Status: DC | PRN

## 2013-07-13 MED ORDER — OXYCODONE HCL 5 MG PO TABS
5.0000 mg | ORAL_TABLET | ORAL | Status: DC | PRN
  Filled 2013-07-13: qty 1

## 2013-07-13 MED ORDER — LOSARTAN POTASSIUM-HCTZ 100-25 MG PO TABS: 1.0000 | ORAL_TABLET | Freq: Every day | ORAL | Status: DC

## 2013-07-13 MED ORDER — LOSARTAN POTASSIUM 50 MG PO TABS
100.0000 mg | ORAL_TABLET | Freq: Every day | ORAL | Status: DC
  Administered 2013-07-13 – 2013-07-15 (×3): 100 mg via ORAL
  Filled 2013-07-13 (×3): qty 2

## 2013-07-13 MED ORDER — HYDROCHLOROTHIAZIDE 25 MG PO TABS
25.0000 mg | ORAL_TABLET | Freq: Every day | ORAL | Status: DC
  Administered 2013-07-13 – 2013-07-14 (×2): 25 mg via ORAL
  Filled 2013-07-13 (×2): qty 1

## 2013-07-13 MED ORDER — SODIUM CHLORIDE 0.9 % IJ SOLN: 3.0000 mL | INTRAMUSCULAR | Status: DC | PRN

## 2013-07-13 MED ORDER — SODIUM CHLORIDE 0.9 % IJ SOLN
3.0000 mL | Freq: Two times a day (BID) | INTRAMUSCULAR | Status: DC
  Administered 2013-07-13 – 2013-07-15 (×3): 3 mL via INTRAVENOUS

## 2013-07-13 MED ORDER — LOPERAMIDE HCL 2 MG PO CAPS
2.0000 mg | ORAL_CAPSULE | Freq: Every day | ORAL | Status: DC
  Administered 2013-07-13: 2 mg via ORAL
  Filled 2013-07-13 (×2): qty 1

## 2013-07-13 MED ORDER — ATORVASTATIN CALCIUM 10 MG PO TABS
10.0000 mg | ORAL_TABLET | Freq: Every day | ORAL | Status: DC
  Administered 2013-07-13 – 2013-07-15 (×3): 10 mg via ORAL
  Filled 2013-07-13 (×3): qty 1

## 2013-07-13 MED ORDER — AMLODIPINE BESYLATE 5 MG PO TABS
5.0000 mg | ORAL_TABLET | Freq: Every day | ORAL | Status: DC
  Administered 2013-07-13 – 2013-07-15 (×3): 5 mg via ORAL
  Filled 2013-07-13 (×3): qty 1

## 2013-07-13 NOTE — Progress Notes (Signed)
4 Days Post-Op  Subjective: She looks great, taking full liquids, Ostomy is working she only walked once yesterday.  She wasn't really understanding the pain issue. She didn't understand she needs to ask for pain pills.  We talked and I think she understands now.  We will work on mobilizing and if she does well regular diet in AM.  Start planning discharge she lives alone normally and wants to know when she can drive already.  Objective: Vital signs in last 24 hours: Temp:  [98.2 F (36.8 C)-99.1 F (37.3 C)] 98.2 F (36.8 C) (05/06 0554) Pulse Rate:  [70-86] 70 (05/06 0554) Resp:  [14-18] 16 (05/06 0554) BP: (119-163)/(60-72) 155/68 mmHg (05/06 0554) SpO2:  [97 %-100 %] 99 % (05/06 0554) Last BM Date: 07/12/13 PO 840 recorded. 925 from the ostomy Afebrile, VSS Labs look good, Na is back to normal Intake/Output from previous day: 05/05 0701 - 05/06 0700 In: 840 [P.O.:840] Out: 1300 [Urine:375; Stool:925] Intake/Output this shift:    General appearance: alert, cooperative and no distress Resp: clear to auscultation bilaterally GI: soft, not complaining of being in pain, just sore.  Ostomy working , tolerating full liquid breakfast.  Incision looks good.  Lab Results:   Recent Labs  07/11/13 0335  WBC 9.0  HGB 9.8*  HCT 29.5*  PLT PLATELET CLUMPS NOTED ON SMEAR    BMET  Recent Labs  07/11/13 0335 07/13/13 0330  NA 135* 138  K 4.1 4.2  CL 102 107  CO2 21 20  GLUCOSE 128* 117*  BUN 5* 6  CREATININE 0.66 0.64  CALCIUM 8.6 8.9   PT/INR No results found for this basename: LABPROT, INR,  in the last 72 hours   Recent Labs Lab 07/07/13 0404  AST 16  ALT 10  ALKPHOS 62  BILITOT 0.3  PROT 6.3  ALBUMIN 3.0*     Lipase     Component Value Date/Time   LIPASE 17 07/06/2013 0806     Studies/Results: No results found.  Medications: . enoxaparin (LOVENOX) injection  40 mg Subcutaneous Q24H  . famotidine (PEPCID) IV  20 mg Intravenous Q12H  . lip balm  1  application Topical BID  . zolpidem  5 mg Oral Once   Diagnosis  Colon, biopsy  - INVASIVE POORLY DIFFERENTIATED ADENOCARCINOMA.  Microscopic Comment  The case was discussed with Dr. Penelope Coop on 07/10/13. (HCL:gt, 07/11/13) 1. Colon, segmental resection for tumor, right - INVASIVE POORLY DIFFERENTIATED ADENOCARCINOMA, INVADING THROUGH THE MUSCULARIS PROPRIA INTO PERICOLONIC FATTY TISSUE WITH EXTENSIVE ANGIOLYMPHATIC INVASION PRESENT. - TUMOR DEPOSITS PRESENT. - FOUR OF FIFTEEN LYMPH NODES, POSITIVE FOR METASTATIC CARCINOMA (4/15). - RESECTION MARGINS, NEGATIVE FOR ATYPIA OR MALIGNANCY. - PLEASE SEE ONCOLOGY TEMPLATE FOR DETAILS.  Pathologic Staging: pT3, pN2a 2. Skin , nevus right abdominal wall - SEBORRHEIC KERATOSIS. Assessment/Plan Colonic obstruction, likely carcinoma (Pathology above) S/p Right colectomy with end ileostomy. 07/09/13, Dr. Armandina Gemma  S/p colonoscopy with biopsy and Niger ink staining, 07/08/13 Dr. Penelope Coop  HTN  Hyperlipidemia  Confusion post op (better today)  Plan:  Continue to advance diet.  Mobilize she has OT and PT ordered.  She was cleared by OT.  She is also being seen by the Wound ostomy Nursing.  Hopefully she can get home this weekend.  We need to make sure she doesn't get dehydrated with the ileostomy.  I have ask her to start talking with her family about having someone with her for a few days.    LOS: 7 days  Earnstine Regal 07/13/2013

## 2013-07-13 NOTE — Progress Notes (Addendum)
Watch for diarrhea / O>>I fluid losses.  Antidiarrheal regimen With ileostomy losses, no need for diuretic.  Agree w d/c HCTZ/losartan.  Low threshold to hold amlodipine

## 2013-07-13 NOTE — Progress Notes (Signed)
Occupational Therapy Treatment Patient Details Name: Deshayla Empson MRN: [id number] DOB: 1924/03/25 Today's Date: 07/13/2013    History of present illness Pt is an 80 year old female s/p right colectomy with end ileostomy on 5/2.   OT comments  Making progress with OT.  Still needs a little help with bed mobility--needs to be mod I.     Follow Up Recommendations  Home health OT;Supervision/Assistance - 24 hour (initial 24/7 recommended)    Equipment Recommendations  None recommended by OT    Recommendations for Other Services      Precautions / Restrictions Precautions Precaution Comments: ileostomy Restrictions Weight Bearing Restrictions: No       Mobility Bed Mobility     Rolling: Modified independent (Device/Increase time) Sidelying to sit: Min assist       General bed mobility comments: pt performed 90% of sidelying to sit  today--needed a little assistance to lift trunk up.  cues for technique  Transfers       Sit to Stand: Supervision              Balance                                   ADL Overall ADL's : Needs assistance/impaired     Grooming: Wash/dry hands;Wash/dry face;Oral care;Standing;Supervision/safety;Brushing hair                   Toilet Transfer: Comfort height toilet;Ambulation;Min guard   Toileting- Clothing Manipulation and Hygiene: Set up;Sit to/from stand       Functional mobility during ADLs: Min guard (used iv pole) General ADL Comments: pt did better with bed mobility:  cued for technique:  pt 90%.  did not want to complete entire adl this am.  Stood for 5 minutes at sink for grooming tasks, including facial care.      Vision                     Perception     Praxis      Cognition   Behavior During Therapy: WFL for tasks assessed/performed Overall Cognitive Status: Within Functional Limits for tasks assessed                       Extremity/Trunk Assessment                Exercises     Shoulder Instructions       General Comments      Pertinent Vitals/ Pain       Abdomen sore; repositioned  Home Living                                          Prior Functioning/Environment              Frequency Min 2X/week     Progress Toward Goals  OT Goals(current goals can now be found in the care plan section)  Progress towards OT goals: Progressing toward goals  ADL Goals Pt Will Perform Grooming: with modified independence;standing Pt Will Transfer to Toilet: with supervision;ambulating;regular height toilet  Plan      Co-evaluation                 End of Session     Activity Tolerance Patient tolerated treatment  well   Patient Left in chair;with call bell/phone within reach   Nurse Communication  (needs ostomy bag changed)        Time: 8921-1941 OT Time Calculation (min): 23 min  Charges: OT General Charges $OT Visit: 1 Procedure OT Treatments $Self Care/Home Management : 23-37 mins  [facility] 07/13/2013, 10:38 AM  Lesle Chris, OTR/L [phone] 07/13/2013

## 2013-07-13 NOTE — Progress Notes (Signed)
Physical Therapy Treatment Patient Details Name: [name] MRN: [id number] DOB: 1924-04-08 Today's Date: 07/13/2013    History of Present Illness Pt is an 80 year old female s/p right colectomy with end ileostomy on 5/2.    PT Comments    Pt feeling better and stated she was up in recliner most of the day.  Amb in hallway twice with one sitting rest break.  Pt reports she has a RW at home from prior [facility].  Pt hopeful to D/C to home Saturday.  Follow Up Recommendations  Home health PT;Other (comment)     Equipment Recommendations  None recommended by PT (has a RW from prior [facility])    Recommendations for Other Services       Precautions / Restrictions Precautions Precaution Comments: ileostomy Restrictions Weight Bearing Restrictions: No    Mobility  Bed Mobility Overal bed mobility: Needs Assistance Bed Mobility: Sit to Supine       Sit to supine: Supervision   General bed mobility comments: increased time but pt was able to get own B LE up onto bed.  Transfers Overall transfer level: Needs assistance Equipment used: Rolling walker (2 wheeled) Transfers: Sit to/from Stand Sit to Stand: Supervision         General transfer comment: good safety tech and increased time  Ambulation/Gait Ambulation/Gait assistance: Supervision;Min guard Ambulation Distance (Feet): 160 Feet (one sitting rest break ) Assistive device: Rolling walker (2 wheeled) Gait Pattern/deviations: Step-through pattern Gait velocity: decr   General Gait Details: amb with RW increased distance with one sitting rest break.     Stairs            Wheelchair Mobility    Modified Rankin (Stroke Patients Only)       Balance                                    Cognition                            Exercises      General Comments        Pertinent Vitals/Pain C/o ABD "tightness"    Home Living                      Prior Function            PT Goals (current goals can now be found in the care plan section) Progress towards PT goals: Progressing toward goals    Frequency  Min 3X/week    PT Plan      Co-evaluation             End of Session Equipment Utilized During Treatment: Gait belt Activity Tolerance: Patient tolerated treatment well Patient left: in bed;with call bell/phone within reach     Time: 7026-3785 PT Time Calculation (min): 24 min  Charges:  $Gait Training: 23-37 mins                    G Codes:      Rica Koyanagi  PTA [facility]  Acute  Rehab Pager      [phone]

## 2013-07-13 NOTE — Progress Notes (Signed)
Progress Note   [name] BJY:[id number] DOB: 12-Dec-1924 DOA: 07/06/2013 PCP: Melinda Crutch   Brief Narrative:   [name] is an 80 y.o. female with a PMH of hypertension and hyperlipidemia was admitted 07/06/13 with a chief complaint of a two-week history of abdominal discomfort. Initial evaluation in the ER revealed a new colonic mass. She had a colonoscopy on 07/08/13 with biopsies positive for invasive poorly differentiated adenocarcinoma. She subsequently underwent a right hemicolectomy on 07/09/13.  Assessment/Plan:   Principal Problem:   Colon cancer / colonic obstruction  Recuperating from right hemicolectomy and end ileostomy.  Advance diet per surgery.  Wound ostomy nurse has provided education 07/11/13.  Outpatient oncology appointment scheduled 07/29/13. Active Problems:   Hyperlipidemia  Resume Lipitor.   HTN (hypertension)  Norvasc and Hyzaar remain on hold. Resume.   Nausea alone  Continue Zofran as needed.   DVT Prophylaxis  Continue Lovenox.  Code Status: Full. Family Communication: No family at the bedside. Disposition Plan: Home when stable. Lives alone but has family that can help out if needed.   IV Access:    Peripheral IV   Procedures:    Colonoscopy 07/08/13: Colonic obstruction concerning for malignancy with biopsies taken.  Right hemicolectomy with end ileostomy 07/09/13.   Medical Consultants:    Dr. Anson Fret, Gastroenterology  Dr. Armandina Gemma, Surgery  Other Consultants:    Wound care nurse  Physical therapy: Home health PT.  Occupational therapy: Home health OT with 24-hour supervision recommended upon discharge.   Anti-Infectives:    None.  Subjective:   [name] has some soreness at the operative site, but no frank pain. Occasional nausea but no vomiting. Ostomy output mostly liquid. No dyspnea or cough.  Objective:    Filed Vitals:   07/12/13 1859 07/12/13 2131 07/13/13 0554 07/13/13 1258    BP: 154/72 163/66 155/68 132/63  Pulse: 74 76 70 71  Temp: 99.1 F (37.3 C) 98.2 F (36.8 C) 98.2 F (36.8 C) 98.3 F (36.8 C)  TempSrc: Oral Oral Oral Oral  Resp: 18 16 16 16   Height:      Weight:      SpO2: 100% 100% 99% 98%    Intake/Output Summary (Last 24 hours) at 07/13/13 1353 Last data filed at 07/13/13 0900  Gross per 24 hour  Intake    120 ml  Output    975 ml  Net   -855 ml    Exam: Gen:  NAD Cardiovascular:  RRR, No M/R/G Respiratory:  Lungs CTAB Gastrointestinal:  Abdomen soft, staples mid abdominal incision with wound edges well approximated no signs of infection, + BS, ostomy draining green/brown dark liquid. Extremities:  No C/E/C   Data Reviewed:    Labs: Basic Metabolic Panel:  Recent Labs Lab 07/07/13 0404 07/10/13 0440 07/11/13 0335 07/13/13 0330  NA 139 135* 135* 138  K 3.7 4.3 4.1 4.2  CL 102 101 102 107  CO2 25 23 21 20   GLUCOSE 149* 166* 128* 117*  BUN 13 6 5* 6  CREATININE 0.77 0.67 0.66 0.64  CALCIUM 9.2 8.3* 8.6 8.9   GFR Estimated Creatinine Clearance: 46.8 ml/min (by C-G formula based on Cr of 0.64). Liver Function Tests:  Recent Labs Lab 07/07/13 0404  AST 16  ALT 10  ALKPHOS 62  BILITOT 0.3  PROT 6.3  ALBUMIN 3.0*   CBC:  Recent Labs Lab 07/07/13 0404 07/10/13 0440 07/11/13 0335  WBC 8.3 12.8* 9.0  HGB 10.6* 9.9* 9.8*  HCT 34.1* 30.4* 29.5*  MCV 81.8 80.4 79.1  PLT 313 290 PLATELET CLUMPS NOTED ON SMEAR   Sepsis Labs:  Recent Labs Lab 07/07/13 0404 07/10/13 0440 07/11/13 0335  WBC 8.3 12.8* 9.0   Microbiology Recent Results (from the past 240 hour(s))  SURGICAL PCR SCREEN     Status: None   Collection Time    07/08/13  3:31 PM      Result Value Ref Range Status   MRSA, PCR NEGATIVE  NEGATIVE Final   Staphylococcus aureus NEGATIVE  NEGATIVE Final   Comment:            The Xpert SA Assay (FDA     approved for NASAL specimens     in patients over 52 years of age),     is one component of      a comprehensive surveillance     program.  Test performance has     been validated by [employer] for patients greater     than or equal to 72 year old.     It is not intended     to diagnose infection nor to     guide or monitor treatment.     Radiographs/Studies:   Ct Abdomen Pelvis W Contrast  07/06/2013   CLINICAL DATA:  Intermittent generalized abdominal pain for 2 weeks.  EXAM: CT ABDOMEN AND PELVIS WITH CONTRAST  TECHNIQUE: Multidetector CT imaging of the abdomen and pelvis was performed using the standard protocol following bolus administration of intravenous contrast.  CONTRAST:  80mL OMNIPAQUE IOHEXOL 300 MG/ML SOLN, 153mL OMNIPAQUE IOHEXOL 300 MG/ML SOLN  COMPARISON:  None.  FINDINGS: There is a 4 mm nodule in the right middle lobe. 4 mm nodule is also present in the right lower lobe. Subpleural opacities in the lung bases may reflect scarring, atelectasis, or possibly mild fibrosis. There is no pleural effusion.  The liver is diffusely low in attenuation, compatible with mild to moderate steatosis. Punctate calcifications in the liver and spleen likely reflect prior granulomatous infection. The gallbladder, adrenal glands, and pancreas have an unremarkable enhanced appearance. Small bilateral renal cyst and bilateral extrarenal pelves are noted.  There is a small hiatal hernia. There is a focal area of wall thickening involving the hepatic flexure of the colon measuring approximately 3 cm in length (series 2, image 34). The colon distal to this is decompressed. The more proximal colon is fluid-filled and dilated. There is also dilatation of multiple mid and distal small bowel loops, which are fluid-filled and measure up to 3.7 cm in diameter.  Moderate atherosclerotic aortic calcification is present. Uterus and ovaries are identified. No pelvic mass is seen. Bladder is unremarkable. No free fluid or enlarged lymph nodes are identified. Prior right total hip arthroplasty is noted.  Multilevel degenerative disc disease is present in the lower thoracic and lumbar spine. Left-greater-than-right facet arthrosis is present in the lower lumbar spine, greatest at L4-5.  IMPRESSION: 1. Focal wall thickening in the hepatic flexure of the colon with resultant bowel obstruction. Findings are concerning for primary colonic malignancy. 2. 4 mm nodules in the right lung base, indeterminate. 3. Hepatic steatosis. 4. Small hiatal hernia. These results were called by telephone at the time of interpretation on 07/06/2013 at 9:43 AM to Dr. Pattricia Boss , who verbally acknowledged these results.   Electronically Signed   By: Logan Bores   On: 07/06/2013 09:45   Dg Chest Port 1 View  07/06/2013  CLINICAL DATA:  Mid upper back pain for 1-2 weeks  EXAM: PORTABLE CHEST - 1 VIEW  COMPARISON:  None.  FINDINGS: The heart size and mediastinal contours are within normal limits. Both lungs are clear. Bilateral glenohumeral osteoarthritis.  IMPRESSION: No active disease.   Electronically Signed   By: Kathreen Devoid   On: 07/06/2013 10:41    Medications:   . enoxaparin (LOVENOX) injection  40 mg Subcutaneous Q24H  . famotidine (PEPCID) IV  20 mg Intravenous Q12H  . lip balm  1 application Topical BID  . zolpidem  5 mg Oral Once   Continuous Infusions: . dextrose 5 % and 0.9 % NaCl with KCl 40 mEq/L 75 mL/hr at 07/12/13 2243    Time spent: 25 minutes.   LOS: 7 days   [place]  [facility] Pager [phone]. If unable to reach me by pager, please call my cell phone at [phone].  *Please refer to [email or web address], password [email or web address] to get updated schedule on who will round on this patient, as hospitalists switch teams weekly. If 7PM-7AM, please contact night-coverage at [email or web address], password [email or web address] for any overnight needs.  07/13/2013, 1:53 PM    **Disclaimer: This note was dictated with voice recognition software. Similar sounding words can inadvertently be transcribed and this note may contain  transcription errors which may not have been corrected upon publication of note.**

## 2013-07-14 ENCOUNTER — Telehealth (INDEPENDENT_AMBULATORY_CARE_PROVIDER_SITE_OTHER): Payer: Self-pay

## 2013-07-14 DIAGNOSIS — Z932 Ileostomy status: Secondary | ICD-10-CM

## 2013-07-14 LAB — BASIC METABOLIC PANEL
BUN: 6 mg/dL (ref 6–23)
CALCIUM: 9.2 mg/dL (ref 8.4–10.5)
CO2: 21 mEq/L (ref 19–32)
CREATININE: 0.67 mg/dL (ref 0.50–1.10)
Chloride: 106 mEq/L (ref 96–112)
GFR calc non Af Amer: 77 mL/min — ABNORMAL LOW (ref >90)
GFR, EST AFRICAN AMERICAN: 89 mL/min — AB (ref >90)
Glucose, Bld: 100 mg/dL — ABNORMAL HIGH (ref 70–99)
POTASSIUM: 4.1 meq/L (ref 3.7–5.3)
Sodium: 138 mEq/L (ref 137–147)

## 2013-07-14 MED ORDER — LOPERAMIDE HCL 2 MG PO CAPS
2.0000 mg | ORAL_CAPSULE | Freq: Three times a day (TID) | ORAL | Status: DC
  Administered 2013-07-14 – 2013-07-15 (×3): 2 mg via ORAL
  Filled 2013-07-14 (×5): qty 1

## 2013-07-14 NOTE — Progress Notes (Signed)
NUTRITION FOLLOW UP  Intervention:   -Provided pt with "Ileostomy Nutrition Therapy" handout -Encouraged intake of low fiber diet, low potassium foods, and soft proteins with gradual introduction of fiber foods -Will continue to monitor  Nutrition Dx:   Inadequate oral intake related to inability to eat as evidenced by NPO status-improving with diet advancment   Goal:   Pt to meet >/= 90% of their estimated nutrition needs-progressing    Monitor:   Total protein/energy intake, labs, weights, GI profile, education needs  Assessment:   4/30:[name] is a 80 y.o. female With history of hypertension. Who presents to the ED complaining of abdominal discomfort for 2 weeks duration. Nothing she is aware of makes it better. Eating food makes the pain worse. Patient has had decreased appetite and increase in nausea as well as vomiting. While in the emergency department had CT scan which showed new colonic mass. General surgery was consulted and recommended medical admission. The patient states the pain is over her abdomen. Since onset has been persistent and gradually getting worse.  -Pt endorsed overall decreased PO intake for past 3 weeks  -Noted feelings of taste changes-"nothing tasted right", abd pain, and burping/reflux like symptoms  -Had appetite but was unable to find foods she was able to tolerate/enjoy eating  -Diet recall indicated pt consuming eggs/cereal for breakfast and small meals for lunch and dinner. Would try to eat out at different restaurants to promote appetite  -Also attributed nausea/decreased appetite from recent UTI and antibiotic regimen pt was started on last week  -Denied any unintentional wt loss  -NGT on low intermittent suction  -Pt plan for colonoscopy r/t colonic mass and concern for malignancy  -Family had reported possible initiation of TPN; discussed diet advancement with MD. MD noted nutrition support initiation will be decided per surgery  recommendations  -Will continue to monitor. Pt has tried Ensure/Boost supplements pta, and was willing to restart supplement regimen as tolerated  -Refeeding labs all WNL  -Low albumin  5/07: -Pt s/p right hemicolectomy with ileostomy -Diet advanced to clear liquid on 5/5 to full liquid on 5/05  -Able to tolerate Soft diet on 5/07 -Pt reported good appetite, is eating >75% of meals -Denied any nausea/abd pain post meals -Discussed soft diet-encouraged intake of low fiber foods, well cooked fruits/vegetables, and tender proteins (eggs, yogurt, cottage cheese) -Recommend pt comply with diet for 6 weeks, with gradual introduction of fiber foods into diet -Provided nutrition education materials -Encouraged pt to contact RD with additional questions -200-350 ml ostomy output  Height: Ht Readings from Last 1 Encounters:  07/06/13 5\' 4"  (1.626 m)    Weight Status:   Wt Readings from Last 1 Encounters:  07/14/13 142 lb 6.4 oz (64.592 kg)    Re-estimated needs:  Kcal: 1550-1750  Protein:70-80 gram  Fluid: >/=1700 ml/daily  Skin: no edema, surgical wound on abd  Diet Order: Criss Rosales   Intake/Output Summary (Last 24 hours) at 07/14/13 1148 Last data filed at 07/14/13 0954  Gross per 24 hour  Intake   1200 ml  Output    525 ml  Net    675 ml    Last BM: 5/07   Labs:   Recent Labs Lab 07/11/13 0335 07/13/13 0330 07/14/13 0400  NA 135* 138 138  K 4.1 4.2 4.1  CL 102 107 106  CO2 21 20 21   BUN 5* 6 6  CREATININE 0.66 0.64 0.67  CALCIUM 8.6 8.9 9.2  GLUCOSE 128* 117* 100*  CBG (last 3)  No results found for this basename: GLUCAP,  in the last 72 hours  Scheduled Meds: . amLODipine  5 mg Oral Daily  . atorvastatin  10 mg Oral Daily  . enoxaparin (LOVENOX) injection  40 mg Subcutaneous Q24H  . losartan  100 mg Oral Daily   And  . hydrochlorothiazide  25 mg Oral Daily  . lip balm  1 application Topical BID  . loperamide  2 mg Oral QHS  . sodium chloride  3 mL  Intravenous Q12H  . zolpidem  5 mg Oral Once    Continuous Infusions: none  Atlee Abide MS RD LDN Clinical Dietitian [phone]

## 2013-07-14 NOTE — Care Management Note (Signed)
    Page 1 of 2   07/14/2013     11:14:48 AM CARE MANAGEMENT NOTE 07/14/2013  Patient:  [name], [name]   Account Number:  [id number]  Date Initiated:  07/06/2013  Documentation initiated by:  DAVIS,TYMEEKA  Subjective/Objective Assessment:   80 yo female admitted with a colonic mass.     Action/Plan:   Discharged when stable   Anticipated DC Date:  07/14/2013   Anticipated DC Plan:  [place]  CM consult      Choice offered to / List presented to:  NA   DME arranged  NA      DME agency  NA     [place] arranged  HH-1 RN  [place]   Status of service:  Completed, signed off Medicare Important Message given?   (If response is "NO", the following Medicare IM given date fields will be blank) Date Medicare IM given:   Date Additional Medicare IM given:    Discharge Disposition:  [place]  Per UR Regulation:  Reviewed for med. necessity/level of care/duration of stay  If discussed at [place] of Stay Meetings, dates discussed:    Comments:  Mechele Claude, RN Registered Nurse Addendum CASE MANAGEMENT Care Management Note Service date: 07/12/2013 10:33 AM Cm spoke with patient at the bedside concerning discharge planning. Pt states resides home alone. Pt states adult son and daughter to assist with home care. Pt states having RW for home dme use. No further equipment requested. Pt request HHRN and aide to assist with home care. Pt declines SNF placement if recommended. Pt offered choice of [place] agencies in [place]. Per pt choice [facility] to provide [facility] services upon dc. [place] Go rep Kirke Corin notified.        07/06/13 [place] Chart reviewed for utilization of services. No needs assessed at this time. Will continue to follow.

## 2013-07-14 NOTE — Progress Notes (Signed)
Occupational Therapy Treatment Patient Details Name: [name] MRN: [id number] DOB: Jun 19, 1924 Today's Date: 07/14/2013    History of present illness Pt is an 80 year old female s/p right colectomy with end ileostomy on 5/2.   OT comments  Pt is making good progress with OT.  Fatiques but takes rest breaks.  She states she is processing what she has to do for colostomy.  Pt was able to get up from flat bed with supervision--one cue for technique but no physical assistance today.  Washed hair at sink per her request.  Follow Up Recommendations  Home health OT;Supervision - Intermittent    Equipment Recommendations  None recommended by OT    Recommendations for Other Services      Precautions / Restrictions Precautions Precaution Comments: ileostomy Restrictions Weight Bearing Restrictions: No       Mobility Bed Mobility           Sit to supine: Supervision   General bed mobility comments: flat bed, extra time, one cue for rolling techique  Transfers   Equipment used: Rolling walker (2 wheeled) Transfers: Sit to/from Stand Sit to Stand: Supervision         General transfer comment: one cue for hand placement    Balance                                   ADL Overall ADL's : Needs assistance/impaired     Grooming: Oral care;Wash/dry face;Brushing hair;Standing;Modified independent           Upper Body Dressing : Minimal assistance;Sitting (due to IV)       Toilet Transfer: Supervision/safety;Ambulation;Comfort height toilet   Toileting- Clothing Manipulation and Hygiene: Modified independent;Sitting/lateral lean       Functional mobility during ADLs: Supervision/safety;Rolling walker General ADL Comments: Pt stood at sink to wash hair (with mod A due to positioning/small space).  She was supervisioin getting out of flat bed today without rails.  one cue to roll for decreased abdominal strain.  She did not want to complete entire adl  this am.  Worked on retrieving items safely for grooming.  She does have a reacher at home, if needed.  Encouraged sitting and rest breaks for energy conservation      Vision                     Perception     Praxis      Cognition   Behavior During Therapy: [facility] for tasks assessed/performed Overall Cognitive Status: Within Functional Limits for tasks assessed                       Extremity/Trunk Assessment               Exercises     Shoulder Instructions       General Comments      Pertinent Vitals/ Pain       Has soreness at ostomy site; repositioned  Home Living                                          Prior Functioning/Environment              Frequency Min 2X/week     Progress Toward Goals  OT Goals(current goals can now be  found in the care plan section)  Progress towards OT goals: Progressing toward goals     Plan      Co-evaluation                 End of Session     Activity Tolerance Patient tolerated treatment well   Patient Left in chair;with call bell/phone within reach   Nurse Communication          Time: 6168-3729 OT Time Calculation (min): 42 min  Charges: OT General Charges $OT Visit: 1 Procedure OT Treatments $Self Care/Home Management : 38-52 mins  [facility] 07/14/2013, 9:56 AM  Lesle Chris, OTR/L [phone] 07/14/2013

## 2013-07-14 NOTE — Progress Notes (Signed)
Progress Note   [name] BJS:[id number] DOB: [birth year] DOA: 07/06/2013 PCP: Melinda Crutch   Brief Narrative:   [name] is an 80 y.o. female with a PMH of hypertension and hyperlipidemia was admitted 07/06/13 with a chief complaint of a two-week history of abdominal discomfort. Initial evaluation in the ER revealed a new colonic mass. She had a colonoscopy on 07/08/13 with biopsies positive for invasive poorly differentiated adenocarcinoma. She subsequently underwent a right hemicolectomy on 07/09/13.  Assessment/Plan:   Principal Problem:   Colon cancer / colonic obstruction  Recuperating from right hemicolectomy and end ileostomy.  Advance diet per surgery.  Wound ostomy nurse has provided education 07/11/13.  Outpatient oncology appointment scheduled 07/29/13. Active Problems:   Hyperlipidemia  Continue Lipitor.   HTN (hypertension)  Continue Norvasc and Hyzaar.   Nausea alone  Continue Zofran as needed.   DVT Prophylaxis  Continue Lovenox.  Code Status: Full. Family Communication: No family at the bedside. Disposition Plan: Home when stable. Lives alone but has family that can help out if needed.   IV Access:    Peripheral IV   Procedures:    Colonoscopy 07/08/13: Colonic obstruction concerning for malignancy with biopsies taken.  Right hemicolectomy with end ileostomy 07/09/13.   Medical Consultants:    Dr. Anson Fret, Gastroenterology  Dr. Armandina Gemma, Surgery  Other Consultants:    Wound care nurse  Physical therapy: Home health PT.  Occupational therapy: Home health OT with 24-hour supervision recommended upon discharge.   Anti-Infectives:    None.  Subjective:   [name] feels well overall, some soreness but no significant pain. Has air and stool in her ostomy bed. No vomiting, tolerating diet, ambulating. She is a bit anxious about her diagnosis and whether or not she will have to have additional  treatment.  Objective:    Filed Vitals:   07/13/13 1857 07/14/13 0005 07/14/13 0006 07/14/13 0007  BP:  151/65 138/73 130/73  Pulse:  74 76 80  Temp:  98.4 F (36.9 C)    TempSrc:  Oral    Resp:  16    Height:      Weight: 65.772 kg (145 lb)     SpO2:        Intake/Output Summary (Last 24 hours) at 07/14/13 0750 Last data filed at 07/14/13 0600  Gross per 24 hour  Intake   1080 ml  Output    525 ml  Net    555 ml    Exam: Gen:  NAD, mildly anxious Cardiovascular:  RRR, No M/R/G Respiratory:  Lungs CTAB Gastrointestinal:  Abdomen soft, staples mid abdominal incision with wound edges well approximated no signs of infection, + BS, ostomy draining green/brown dark liquid. Extremities:  No C/E/C   Data Reviewed:    Labs: Basic Metabolic Panel:  Recent Labs Lab 07/10/13 0440 07/11/13 0335 07/13/13 0330 07/14/13 0400  NA 135* 135* 138 138  K 4.3 4.1 4.2 4.1  CL 101 102 107 106  CO2 23 21 20 21   GLUCOSE 166* 128* 117* 100*  BUN 6 5* 6 6  CREATININE 0.67 0.66 0.64 0.67  CALCIUM 8.3* 8.6 8.9 9.2   GFR Estimated Creatinine Clearance: 46.2 ml/min (by C-G formula based on Cr of 0.67). Liver Function Tests: No results found for this basename: AST, ALT, ALKPHOS, BILITOT, PROT, ALBUMIN,  in the last 168 hours CBC:  Recent Labs Lab 07/10/13 0440 07/11/13 0335  WBC 12.8* 9.0  HGB 9.9* 9.8*  HCT 30.4* 29.5*  MCV 80.4 79.1  PLT 290 PLATELET CLUMPS NOTED ON SMEAR   Sepsis Labs:  Recent Labs Lab 07/10/13 0440 07/11/13 0335  WBC 12.8* 9.0   Microbiology Recent Results (from the past 240 hour(s))  SURGICAL PCR SCREEN     Status: None   Collection Time    07/08/13  3:31 PM      Result Value Ref Range Status   MRSA, PCR NEGATIVE  NEGATIVE Final   Staphylococcus aureus NEGATIVE  NEGATIVE Final   Comment:            The Xpert SA Assay (FDA     approved for NASAL specimens     in patients over 35 years of age),     is one component of     a comprehensive  surveillance     program.  Test performance has     been validated by [employer] for patients greater     than or equal to 93 year old.     It is not intended     to diagnose infection nor to     guide or monitor treatment.     Radiographs/Studies:   Ct Abdomen Pelvis W Contrast  07/06/2013   CLINICAL DATA:  Intermittent generalized abdominal pain for 2 weeks.  EXAM: CT ABDOMEN AND PELVIS WITH CONTRAST  TECHNIQUE: Multidetector CT imaging of the abdomen and pelvis was performed using the standard protocol following bolus administration of intravenous contrast.  CONTRAST:  48mL OMNIPAQUE IOHEXOL 300 MG/ML SOLN, 153mL OMNIPAQUE IOHEXOL 300 MG/ML SOLN  COMPARISON:  None.  FINDINGS: There is a 4 mm nodule in the right middle lobe. 4 mm nodule is also present in the right lower lobe. Subpleural opacities in the lung bases may reflect scarring, atelectasis, or possibly mild fibrosis. There is no pleural effusion.  The liver is diffusely low in attenuation, compatible with mild to moderate steatosis. Punctate calcifications in the liver and spleen likely reflect prior granulomatous infection. The gallbladder, adrenal glands, and pancreas have an unremarkable enhanced appearance. Small bilateral renal cyst and bilateral extrarenal pelves are noted.  There is a small hiatal hernia. There is a focal area of wall thickening involving the hepatic flexure of the colon measuring approximately 3 cm in length (series 2, image 34). The colon distal to this is decompressed. The more proximal colon is fluid-filled and dilated. There is also dilatation of multiple mid and distal small bowel loops, which are fluid-filled and measure up to 3.7 cm in diameter.  Moderate atherosclerotic aortic calcification is present. Uterus and ovaries are identified. No pelvic mass is seen. Bladder is unremarkable. No free fluid or enlarged lymph nodes are identified. Prior right total hip arthroplasty is noted. Multilevel  degenerative disc disease is present in the lower thoracic and lumbar spine. Left-greater-than-right facet arthrosis is present in the lower lumbar spine, greatest at L4-5.  IMPRESSION: 1. Focal wall thickening in the hepatic flexure of the colon with resultant bowel obstruction. Findings are concerning for primary colonic malignancy. 2. 4 mm nodules in the right lung base, indeterminate. 3. Hepatic steatosis. 4. Small hiatal hernia. These results were called by telephone at the time of interpretation on 07/06/2013 at 9:43 AM to Dr. Pattricia Boss , who verbally acknowledged these results.   Electronically Signed   By: Logan Bores   On: 07/06/2013 09:45   Dg Chest Port 1 View  07/06/2013   CLINICAL DATA:  Mid upper back pain for 1-2 weeks  EXAM: PORTABLE CHEST - 1 VIEW  COMPARISON:  None.  FINDINGS: The heart size and mediastinal contours are within normal limits. Both lungs are clear. Bilateral glenohumeral osteoarthritis.  IMPRESSION: No active disease.   Electronically Signed   By: Kathreen Devoid   On: 07/06/2013 10:41    Medications:   . amLODipine  5 mg Oral Daily  . atorvastatin  10 mg Oral Daily  . enoxaparin (LOVENOX) injection  40 mg Subcutaneous Q24H  . losartan  100 mg Oral Daily   And  . hydrochlorothiazide  25 mg Oral Daily  . lip balm  1 application Topical BID  . loperamide  2 mg Oral QHS  . sodium chloride  3 mL Intravenous Q12H  . zolpidem  5 mg Oral Once   Continuous Infusions:    Time spent: 25 minutes.   LOS: 8 days   Walker  [facility] Pager [phone]. If unable to reach me by pager, please call my cell phone at [phone].  *Please refer to [email or web address], password [email or web address] to get updated schedule on who will round on this patient, as hospitalists switch teams weekly. If 7PM-7AM, please contact night-coverage at [email or web address], password [email or web address] for any overnight needs.  07/14/2013, 7:50 AM    **Disclaimer: This note was dictated with voice recognition software.  Similar sounding words can inadvertently be transcribed and this note may contain transcription errors which may not have been corrected upon publication of note.**

## 2013-07-14 NOTE — Telephone Encounter (Signed)
Message copied by Dois Davenport on Thu Jul 14, 2013  8:43 AM ------      Message from: Marcellus Scott B      Created: Wed Jul 13, 2013  1:49 PM       Thanks, I'll get her in to see [place].      Barnett Applebaum      ----- Message -----         From: Dois Davenport, LPN         Sent: 07/14/3891  12:03 PM           To: Carola Frost, RN            I have entered ref to Dr Benay Spice as new consult colon ca. Anderson Malta will be following to be sure appt is made.  Dr Johney Maine d/c pt with this request but this is Dr Gala Lewandowsky pt. I have also requested pt be put on next GI tumor conference per his request.       Thanks,      Darcella Cheshire LPNORT       ------

## 2013-07-14 NOTE — Consult Note (Addendum)
WOC ostomy follow-up consult note Stoma type/location: RLQ Ileostomy Stomal assessment/size: 1 and 5/8 inch red and moist, above skin level Peristomal assessment: Intact skin surrounding. Output 150cc liquid brown stool Ostomy pouching: 2pc. flat pouching system, no skin barrier ring required. Education provided: Discussed pouching routines, emptying, and ordering supplies.  Pt assisted with cutting pattern and applying 2 piece pouch.  Able to perform a return demonstration of the "Lock and Roll" Pouch Closure mechanism to empty.  Reviewed Secure Start details to obtain supplies and 4 additional sets of pouching supplies in room. Discussed dietary precautions to avoid blockage and importance of replacing fluids to avoid dehydration. Very anxious regarding impending discharge home; recommend home health follow-up. Julien Girt MSN, RN, [place], [place], [place]

## 2013-07-14 NOTE — Progress Notes (Signed)
[place], MD, [address] [place], [place] [address] Phone: [phone] FAX: [phone]    [name] [id number] 25-May-1924  CARE TEAM:  PCP: [facility]  Outpatient Care Team: Patient Care Team: Melinda Crutch as PCP - General (Obstetrics and Gynecology)  Inpatient Treatment Team: Treatment Team: Attending Provider: Venetia Maxon Rama, MD; Consulting Physician: Nolon Nations, MD; Consulting Physician: Wonda Horner, MD; Technician: Ileene Rubens, NT; Rounding Team: Joycelyn Das, MD; Technician: Jones Skene [place], [place]   Subjective:  Feeling better Tol liquids Emptying ileostomy frequently  Objective:  Vital signs:  Filed Vitals:   07/14/13 0005 07/14/13 0006 07/14/13 0007 07/14/13 0700  BP: 151/65 138/73 130/73 145/66  Pulse: 74 76 80 71  Temp: 98.4 F (36.9 C)   99 F (37.2 C)  TempSrc: Oral   Oral  Resp: 16     Height:      Weight:    142 lb 6.4 oz (64.592 kg)  SpO2:    98%    Last BM Date: 07/14/13 (ostomy)  Intake/Output   Yesterday:  05/06 0701 - 05/07 0700 In: 1080 [P.O.:1080] Out: 37 [Stool:525] This shift:  Total I/O In: 240 [P.O.:240] Out: -   Bowel function:  Flatus: y  BM: liquid succus  Drain: n/a  Physical Exam:  General: Pt awake/alert/oriented x4 in no acute distress Eyes: PERRL, normal EOM.  Sclera clear.  No icterus Neuro: CN II-XII intact w/o focal sensory/motor deficits. Lymph: No head/neck/groin lymphadenopathy Psych:  No delerium/psychosis/paranoia HENT: Normocephalic, Mucus membranes moist.  No thrush Neck: Supple, No tracheal deviation Chest: No chest wall pain w good excursion CV:  Pulses intact.  Regular rhythm MS: Normal AROM mjr joints.  No obvious deformity Abdomen: Soft.  Nondistended.  Mildly tender at incision only.  No evidence of peritonitis.  No incarcerated hernias. Ext:  SCDs BLE.  No mjr edema.  No cyanosis Skin: No petechiae /  purpura   Problem List:   Principal Problem:   Colon cancer Active Problems:   HTN (hypertension)   Colonic obstruction   Nausea alone   Other and unspecified hyperlipidemia   Assessment  [name]  80 y.o. female  5 Days Post-Op  Procedure(s): PARTIAL COLECTOMY AND EXCISION OF NEVUS FROM RIGHT ABDOMINAL WALL  Ileus resolving  Plan:  -inc anti-ileus meds- imodium TID -adv diet -PT/OT -VTE prophylaxis- SCDs, etc -mobilize as tolerated to help recovery  D/C patient from hospital when patient meets criteria (anticipate in 1-2 day(s)):  Tolerating oral intake well Ambulating in walkways Adequate pain control without IV medications Urinating  Having flatus   Adin Hector, M.D., F.A.C.S. Gastrointestinal and Minimally Invasive Surgery [facility] [address] [place], [place] [address] [phone] Main / Paging   07/14/2013   Results:   Labs: Results for orders placed during the hospital encounter of 07/06/13 (from the past 48 hour(s))  BASIC METABOLIC PANEL     Status: Abnormal   Collection Time    07/13/13  3:30 AM      Result Value Ref Range   Sodium 138  137 - 147 mEq/L   Potassium 4.2  3.7 - 5.3 mEq/L   Chloride 107  96 - 112 mEq/L   CO2 20  19 - 32 mEq/L   Glucose, Bld 117 (*) 70 - 99 mg/dL   BUN 6  6 - 23 mg/dL   Creatinine, Ser 0.64  0.50 - 1.10 mg/dL   Calcium 8.9  8.4 - 10.5 mg/dL   GFR calc non Af Amer 78 (*) >90 mL/min   GFR calc Af Amer >90  >90 mL/min   Comment: (NOTE)     The eGFR has been calculated using the CKD EPI equation.     This calculation has not been validated in all clinical situations.     eGFR's persistently <90 mL/min signify possible Chronic Kidney     Disease.  BASIC METABOLIC PANEL     Status: Abnormal   Collection Time    07/14/13  4:00 AM      Result Value Ref Range   Sodium 138  137 - 147 mEq/L   Potassium 4.1  3.7 - 5.3 mEq/L   Chloride 106  96 - 112 mEq/L   CO2 21   19 - 32 mEq/L   Glucose, Bld 100 (*) 70 - 99 mg/dL   BUN 6  6 - 23 mg/dL   Creatinine, Ser 0.67  0.50 - 1.10 mg/dL   Calcium 9.2  8.4 - 10.5 mg/dL   GFR calc non Af Amer 77 (*) >90 mL/min   GFR calc Af Amer 89 (*) >90 mL/min   Comment: (NOTE)     The eGFR has been calculated using the CKD EPI equation.     This calculation has not been validated in all clinical situations.     eGFR's persistently <90 mL/min signify possible Chronic Kidney     Disease.    Imaging / Studies: No results found.  Medications / Allergies: per chart  Antibiotics: Anti-infectives   Start     Dose/Rate Route Frequency Ordered Stop   07/09/13 0600  ertapenem (INVANZ) 1 g in sodium chloride 0.9 % 50 mL IVPB  Status:  Discontinued     1 g 100 mL/hr over 30 Minutes Intravenous On call to O.R. 07/08/13 2013 07/09/13 1746       Note: This dictation was prepared with Dragon/digital dictation along with Smartphrase technology. Any transcriptional errors that result from this process are unintentional.

## 2013-07-15 DIAGNOSIS — K56609 Unspecified intestinal obstruction, unspecified as to partial versus complete obstruction: Secondary | ICD-10-CM

## 2013-07-15 DIAGNOSIS — Z932 Ileostomy status: Secondary | ICD-10-CM

## 2013-07-15 LAB — BASIC METABOLIC PANEL
BUN: 9 mg/dL (ref 6–23)
CALCIUM: 9.5 mg/dL (ref 8.4–10.5)
CO2: 23 mEq/L (ref 19–32)
CREATININE: 0.71 mg/dL (ref 0.50–1.10)
Chloride: 103 mEq/L (ref 96–112)
GFR, EST AFRICAN AMERICAN: 87 mL/min — AB (ref >90)
GFR, EST NON AFRICAN AMERICAN: 75 mL/min — AB (ref >90)
Glucose, Bld: 112 mg/dL — ABNORMAL HIGH (ref 70–99)
Potassium: 4.1 mEq/L (ref 3.7–5.3)
Sodium: 139 mEq/L (ref 137–147)

## 2013-07-15 MED ORDER — LOPERAMIDE HCL 2 MG PO CAPS: ORAL_CAPSULE | ORAL | Status: DC

## 2013-07-15 MED ORDER — OXYCODONE HCL 5 MG PO TABS: 5.0000 mg | ORAL_TABLET | ORAL | Status: DC | PRN

## 2013-07-15 MED ORDER — LOPERAMIDE HCL 2 MG PO CAPS: 2.0000 mg | ORAL_CAPSULE | Freq: Three times a day (TID) | ORAL | Status: DC | PRN

## 2013-07-15 NOTE — Progress Notes (Signed)
Physical Therapy Treatment Patient Details Name: [name] MRN: [id number] DOB: [birth year] Today's Date: 18-Jul-2013    History of Present Illness Pt is an 80 year old female s/p right colectomy with end ileostomy on 5/2.    PT Comments    Pt progressing well with activity tolerance increasing daily.  Pt states she has been to/from bathroom without assist and encountered no difficulties.   Follow Up Recommendations  Home health PT     Equipment Recommendations  None recommended by PT    Recommendations for Other Services       Precautions / Restrictions Precautions Precaution Comments: ileostomy Restrictions Weight Bearing Restrictions: No    Mobility  Bed Mobility Overal bed mobility: Needs Assistance Bed Mobility: Supine to Sit Rolling: Modified independent (Device/Increase time) Sidelying to sit: Min assist       General bed mobility comments: Pt states, when you are not here, I can do it myself, I was just taking advantage of the help  Transfers Overall transfer level: Modified independent Equipment used: Rolling walker (2 wheeled) Transfers: Sit to/from Stand Sit to Stand: Modified independent (Device/Increase time)            Ambulation/Gait Ambulation/Gait assistance: Supervision Ambulation Distance (Feet): 200 Feet (and 160) Assistive device: Rolling walker (2 wheeled) Gait Pattern/deviations: Step-through pattern     General Gait Details: progressing well with increased distance and one short standing rest break   Stairs            Wheelchair Mobility    Modified Rankin (Stroke Patients Only)       Balance                                    Cognition Arousal/Alertness: Awake/alert Behavior During Therapy: WFL for tasks assessed/performed Overall Cognitive Status: Within Functional Limits for tasks assessed                      Exercises      General Comments        Pertinent Vitals/Pain Min  c/o pain    Home Living                      Prior Function            PT Goals (current goals can now be found in the care plan section) Acute Rehab PT Goals Patient Stated Goal: home PT Goal Formulation: With patient Time For Goal Achievement: 07/19/13 Potential to Achieve Goals: Good Progress towards PT goals: Progressing toward goals    Frequency  Min 3X/week    PT Plan Current plan remains appropriate    Co-evaluation             End of Session   Activity Tolerance: Patient tolerated treatment well Patient left: in chair;with call bell/phone within reach     Time: 1117-1141 PT Time Calculation (min): 24 min  Charges:  $Gait Training: 23-37 mins                    G Codes:      Mathis Fare 07-18-2013, 12:28 PM

## 2013-07-15 NOTE — Discharge Summary (Signed)
Physician Discharge Summary  Jayce Boyko ZOX:[id number] DOB: 14-Aug-1924 DOA: 07/06/2013  PCP: Melinda Crutch  Admit date: 07/06/2013 Discharge date: 07/15/2013   Recommendations for Outpatient Follow-Up:   1. Followup scheduled with surgery next week for staple removal. 2. Followup scheduled with oncology for further treatment recommendations at appointment time noted below. 3. Recommend close followup of electrolytes. 4. Home health RN, PT, OT, aide set up.   Discharge Diagnosis:   Principal Problem:    Colon cancer with obstruction s/p colectomy/ileostomy 07/09/2013 Active Problems:    HTN (hypertension)    Colonic obstruction    Nausea alone    Other and unspecified hyperlipidemia    Ileostomy in place   Discharge Condition: Improved.  Diet recommendation: Heart healthy     History of Present Illness:   [name] is an 80 y.o. female with a PMH of hypertension and hyperlipidemia was admitted 07/06/13 with a chief complaint of a two-week history of abdominal discomfort. Initial evaluation in the ER revealed a new colonic mass. She had a colonoscopy on 07/08/13 with biopsies positive for invasive poorly differentiated adenocarcinoma. She subsequently underwent a right hemicolectomy on 07/09/13  Hospital Course by Problem:   Principal Problem:  Colon cancer / colonic obstruction  Recuperating from right hemicolectomy and end ileostomy.  Tolerating advancement of diet at discharge.  Wound ostomy nurse has provided education 07/11/13. Home health nursing for ongoing education set up. Outpatient oncology appointment scheduled 07/29/13. Active Problems:  Hyperlipidemia  Continue Lipitor. HTN (hypertension)  Continue Norvasc and Hyzaar. Nausea alone  Continue Zofran as needed.   Procedures:    Colonoscopy 07/08/13: Colonic obstruction concerning for malignancy with biopsies taken.   Right hemicolectomy with end ileostomy 07/09/13.    Medical Consultants:   Dr.  Anson Fret, Gastroenterology  Dr. Armandina Gemma, Surgery    Discharge Exam:   Filed Vitals:   07/15/13 0556  BP: 144/67  Pulse: 72  Temp: 98.7 F (37.1 C)  Resp: 16   Filed Vitals:   07/14/13 0700 07/14/13 1400 07/14/13 2220 07/15/13 0556  BP: 145/66 128/60 134/69 144/67  Pulse: 71 79 77 72  Temp: 99 F (37.2 C) 98.9 F (37.2 C) 98.3 F (36.8 C) 98.7 F (37.1 C)  TempSrc: Oral Oral Oral Oral  Resp:  18 16 16   Height:      Weight: 64.592 kg (142 lb 6.4 oz)   64.592 kg (142 lb 6.4 oz)  SpO2: 98% 98% 97% 99%    Gen:  NAD Cardiovascular:  RRR, No M/R/G Respiratory: Lungs CTAB Gastrointestinal: Abdomen soft, staple line intact with no signs of infection and wound edges well approximated, ostomy right lower cord from with loose but less liquid stool, brown. Extremities: No C/E/C    Discharge Instructions:       Discharge Orders   Future Appointments Provider Department Dept Phone   07/19/2013 10:30 AM [facility] Nurse [facility], [place]   07/29/2013 1:30 PM [facility] [phone]   07/29/2013 2:00 PM Ladell Pier, MD [facility] [phone]   Future Orders Complete By Expires   Diet general  As directed    Scheduling Instructions:   Heart healthy, okay to eat salt for now.   Discharge instructions  As directed    Discharge wound care:  As directed    Face-to-face encounter (required for Medicare/Medicaid patients)  As directed    Questions:     The encounter  with the patient was in whole, or in part, for the following medical condition, which is the primary reason for home health care:  Colon cancer with new ostomy status post colectomy   I certify that, based on my findings, the following services are medically necessary home health services:  Nursing   Physical therapy   My clinical findings support the need for the above services:  Unable  to leave home safely without assistance and/or assistive device   Further, I certify that my clinical findings support that this patient is homebound due to:  Unable to leave home safely without assistance   Reason for Medically Necessary Home Health Services:  Skilled Nursing- Skilled Assessment/Observation   Skilled Nursing- Teaching of Disease Process/Symptom Management   Skilled Nursing- Post-Surgical Wound Assessment and Care   Therapy- Home Adaptation to Facilitate Safety   Therapy- Therapeutic Exercises to Increase Strength and Endurance   Home Health  As directed    Questions:     To provide the following care/treatments:  PT   OT   RN   Home Health Aide   Increase activity slowly  As directed        Medication List    STOP taking these medications       ciprofloxacin 250 MG tablet  Commonly known as:  CIPRO      TAKE these medications       amLODipine 5 MG tablet  Commonly known as:  NORVASC  Take 5 mg by mouth daily.     aspirin 81 MG tablet  Take 81 mg by mouth daily.     atorvastatin 10 MG tablet  Commonly known as:  LIPITOR  Take 10 mg by mouth daily.     loperamide 2 MG capsule  Commonly known as:  IMODIUM  Take 1 tablet 3 times a day and an extra tablet up to 3 times day as needed for greater than 400 cc of ostomy output and a 4 hour period.     losartan-hydrochlorothiazide 100-25 MG per tablet  Commonly known as:  HYZAAR  Take 1 tablet by mouth daily.     oxyCODONE 5 MG immediate release tablet  Commonly known as:  Oxy IR/ROXICODONE  Take 1-2 tablets (5-10 mg total) by mouth every 4 (four) hours as needed for moderate pain, severe pain or breakthrough pain.       Follow-up Information   Follow up with Earnstine Regal, MD. Schedule an appointment as soon as possible for a visit in 3 weeks.   Specialty:  General Surgery   Contact information:   [address] [place] [id number] [phone]       Follow up with [place] On 07/19/2013. (For suture removal; 10:30am wtih a nurse)    Contact information:   [place] [id number] [phone]      Follow up with [facility]. (At appt time noted.)        The results of significant diagnostics from this hospitalization (including imaging, microbiology, ancillary and laboratory) are listed below for reference.     Significant Diagnostic Studies:   Radiographs: Ct Abdomen Pelvis W Contrast  07/06/2013   CLINICAL DATA:  Intermittent generalized abdominal pain for 2 weeks.  EXAM: CT ABDOMEN AND PELVIS WITH CONTRAST  TECHNIQUE: Multidetector CT imaging of the abdomen and pelvis was performed using the standard protocol following bolus administration of intravenous contrast.  CONTRAST:  41mL OMNIPAQUE IOHEXOL  300 MG/ML SOLN, 133mL OMNIPAQUE IOHEXOL 300 MG/ML SOLN  COMPARISON:  None.  FINDINGS: There is a 4 mm nodule in the right middle lobe. 4 mm nodule is also present in the right lower lobe. Subpleural opacities in the lung bases may reflect scarring, atelectasis, or possibly mild fibrosis. There is no pleural effusion.  The liver is diffusely low in attenuation, compatible with mild to moderate steatosis. Punctate calcifications in the liver and spleen likely reflect prior granulomatous infection. The gallbladder, adrenal glands, and pancreas have an unremarkable enhanced appearance. Small bilateral renal cyst and bilateral extrarenal pelves are noted.  There is a small hiatal hernia. There is a focal area of wall thickening involving the hepatic flexure of the colon measuring approximately 3 cm in length (series 2, image 34). The colon distal to this is decompressed. The more proximal colon is fluid-filled and dilated. There is also dilatation of multiple mid and distal small bowel loops, which are fluid-filled and measure up to 3.7 cm in diameter.  Moderate atherosclerotic aortic calcification is present. Uterus and  ovaries are identified. No pelvic mass is seen. Bladder is unremarkable. No free fluid or enlarged lymph nodes are identified. Prior right total hip arthroplasty is noted. Multilevel degenerative disc disease is present in the lower thoracic and lumbar spine. Left-greater-than-right facet arthrosis is present in the lower lumbar spine, greatest at L4-5.  IMPRESSION: 1. Focal wall thickening in the hepatic flexure of the colon with resultant bowel obstruction. Findings are concerning for primary colonic malignancy. 2. 4 mm nodules in the right lung base, indeterminate. 3. Hepatic steatosis. 4. Small hiatal hernia. These results were called by telephone at the time of interpretation on 07/06/2013 at 9:43 AM to Dr. Pattricia Boss , who verbally acknowledged these results.   Electronically Signed   By: Logan Bores   On: 07/06/2013 09:45   Dg Chest Port 1 View  07/06/2013   CLINICAL DATA:  Mid upper back pain for 1-2 weeks  EXAM: PORTABLE CHEST - 1 VIEW  COMPARISON:  None.  FINDINGS: The heart size and mediastinal contours are within normal limits. Both lungs are clear. Bilateral glenohumeral osteoarthritis.  IMPRESSION: No active disease.   Electronically Signed   By: Kathreen Devoid   On: 07/06/2013 10:41    Labs:  Basic Metabolic Panel:  Recent Labs Lab 07/10/13 0440 07/11/13 0335 07/13/13 0330 07/14/13 0400 07/15/13 0355  NA 135* 135* 138 138 139  K 4.3 4.1 4.2 4.1 4.1  CL 101 102 107 106 103  CO2 23 21 20 21 23   GLUCOSE 166* 128* 117* 100* 112*  BUN 6 5* 6 6 9   CREATININE 0.67 0.66 0.64 0.67 0.71  CALCIUM 8.3* 8.6 8.9 9.2 9.5   GFR Estimated Creatinine Clearance: 42.8 ml/min (by C-G formula based on Cr of 0.71).  CBC:  Recent Labs Lab 07/10/13 0440 07/11/13 0335  WBC 12.8* 9.0  HGB 9.9* 9.8*  HCT 30.4* 29.5*  MCV 80.4 79.1  PLT 290 PLATELET CLUMPS NOTED ON SMEAR   Microbiology Recent Results (from the past 240 hour(s))  SURGICAL PCR SCREEN     Status: None   Collection Time     07/08/13  3:31 PM      Result Value Ref Range Status   MRSA, PCR NEGATIVE  NEGATIVE Final   Staphylococcus aureus NEGATIVE  NEGATIVE Final   Comment:            The Xpert SA Assay (FDA     approved for  NASAL specimens     in patients over 47 years of age),     is one component of     a comprehensive surveillance     program.  Test performance has     been validated by [employer] for patients greater     than or equal to 37 year old.     It is not intended     to diagnose infection nor to     guide or monitor treatment.    Time coordinating discharge: 35 minutes.  SignedVenetia Maxon Necia Kamm  Pager [phone] [facility] 07/15/2013, 11:50 AM

## 2013-07-15 NOTE — Progress Notes (Signed)
Patient was stable at time of discharge. Reviewed discharge education with patient and daughter. Answered their questions and they verbalized understanding. Removed IV. Patient left with belongings and ostomy supplies.

## 2013-07-15 NOTE — Progress Notes (Signed)
Patient ID: [name], female   DOB: 1924/03/21, 80 y.o.   MRN: [id number] 6 Days Post-Op  Subjective: Pt feels well this morning.  No complaints.  Tolerating a regular diet  Objective: Vital signs in last 24 hours: Temp:  [98.3 F (36.8 C)-98.9 F (37.2 C)] 98.7 F (37.1 C) (05/08 0556) Pulse Rate:  [72-79] 72 (05/08 0556) Resp:  [16-18] 16 (05/08 0556) BP: (128-144)/(60-69) 144/67 mmHg (05/08 0556) SpO2:  [97 %-99 %] 99 % (05/08 0556) Weight:  [142 lb 6.4 oz (64.592 kg)] 142 lb 6.4 oz (64.592 kg) (05/08 0556) Last BM Date: 07/15/13  Intake/Output from previous day: 05/07 0701 - 05/08 0700 In: 1080 [P.O.:1080] Out: 150 [Stool:150] Intake/Output this shift:    PE: Abd: soft, appropriately tender, ileostomy with thick pudding like output, +BS, incision c/d/i with staples  Lab Results:  No results found for this basename: WBC, HGB, HCT, PLT,  in the last 72 hours BMET  Recent Labs  07/14/13 0400 07/15/13 0355  NA 138 139  K 4.1 4.1  CL 106 103  CO2 21 23  GLUCOSE 100* 112*  BUN 6 9  CREATININE 0.67 0.71  CALCIUM 9.2 9.5   PT/INR No results found for this basename: LABPROT, INR,  in the last 72 hours CMP     Component Value Date/Time   NA 139 07/15/2013 0355   K 4.1 07/15/2013 0355   CL 103 07/15/2013 0355   CO2 23 07/15/2013 0355   GLUCOSE 112* 07/15/2013 0355   BUN 9 07/15/2013 0355   CREATININE 0.71 07/15/2013 0355   CALCIUM 9.5 07/15/2013 0355   PROT 6.3 07/07/2013 0404   ALBUMIN 3.0* 07/07/2013 0404   AST 16 07/07/2013 0404   ALT 10 07/07/2013 0404   ALKPHOS 62 07/07/2013 0404   BILITOT 0.3 07/07/2013 0404   GFRNONAA 75* 07/15/2013 0355   GFRAA 87* 07/15/2013 0355   Lipase     Component Value Date/Time   LIPASE 17 07/06/2013 0806       Studies/Results: No results found.  Anti-infectives: Anti-infectives   Start     Dose/Rate Route Frequency Ordered Stop   07/09/13 0600  ertapenem (INVANZ) 1 g in sodium chloride 0.9 % 50 mL IVPB  Status:  Discontinued     1  g 100 mL/hr over 30 Minutes Intravenous On call to O.R. 07/08/13 2013 07/09/13 1746       Assessment/Plan   6 Days Post-Op Procedure(s):  PARTIAL COLECTOMY AND EXCISION OF NEVUS FROM RIGHT ABDOMINAL WALL Invasive poorly differentiated colonic adenocarcinoma, 4LN+ T3N2  Plan: 1. HH arranged for routine ostomy care at home 2. Patient stable for dc home from our standpoint 3. She will need oncologic follow up as an outpatient 4. She will need follow up next week in our office for staple removal 5. She will need to continue imodium at home to help keep ileostomy output in control.  LOS: 9 days    Henreitta Cea 07/15/2013, 8:41 AM Pager: [phone]

## 2013-07-15 NOTE — Progress Notes (Signed)
Occupational Therapy Treatment Patient Details Name: [name] MRN: [id number] DOB: May 29, 1924 Today's Date: 07/15/2013    History of present illness Pt is an 80 year old female s/p right colectomy with end ileostomy on 5/2.   OT comments  Pt plans d/c home today.  Initiates rest breaks.  Provided energy conservation handout and completed education for this and safety.    Follow Up Recommendations  Home health OT (for iadls)    Equipment Recommendations  None recommended by OT    Recommendations for Other Services      Precautions / Restrictions Precautions Precaution Comments: ileostomy Restrictions Weight Bearing Restrictions: No       Mobility Bed Mobility             Transfers Overall transfer level: Modified independent Equipment used: Rolling walker (2 wheeled) Transfers: Sit to/from Stand Sit to Stand: Modified independent (Device/Increase time)              Balance                                   ADL                                         General ADL Comments: Pt up at mod I level, gathering adls items.  She plans home today and wants to wait to get dressed.  Provided energy conservation handout.  She did initiate a rest break.  Pt is not sure if she still has a shower seat.  If she doesn't, she will do hair and UB adls in shower and complete LB sitting on commode after rest.  Pt verbalizes understanding of  energy conservation.        Vision                     Perception     Praxis      Cognition   Behavior During Therapy: WFL for tasks assessed/performed Overall Cognitive Status: Within Functional Limits for tasks assessed                       Extremity/Trunk Assessment               Exercises     Shoulder Instructions       General Comments      Pertinent Vitals/ Pain       No c/o pain  Home Living                                           Prior Functioning/Environment              Frequency       Progress Toward Goals  OT Goals(current goals can now be found in the care plan section)  Progress towards OT goals: Progressing toward goals  Acute Rehab OT Goals Patient Stated Goal: home  Plan      Co-evaluation                 End of Session     Activity Tolerance Patient tolerated treatment well   Patient Left in bed;with call bell/phone within reach   Nurse Communication  Time: 7616-0737 OT Time Calculation (min): 8 min  Charges: OT General Charges $OT Visit: 1 Procedure OT Treatments $Therapeutic Activity: 8-22 mins  Esha Fincher 07/15/2013, 1:32 PM  Lesle Chris, OTR/L [phone] 07/15/2013

## 2013-07-15 NOTE — Progress Notes (Signed)
Feeling better.  Important to have close followup.  Thank you point is indication for ilio care and avoiding dehydration/renal failure from high-output ileostomy losses.  She seems to be responding well to antidiarrheals.  Hopefully that continues.

## 2013-07-15 NOTE — Discharge Instructions (Signed)
Ostomy Support Information  Yes, Theresia Majors heard that people get along just fine with only one of their eyes, or one of their lungs, or one of their kidneys. But you also know that you have only one intestine and only one bladder, and that leaves you feeling awfully empty, both physically and emotionally: You think no other people go around without part of their intestine with the ends of their intestines sticking out through their abdominal walls.  Well, you are wrong! There are nearly three quarters of a million people in the [place] who have an ostomy; people who have had surgery to remove all or part of their colons or bladders. There is even a national association, the [place] [employer] [place] with over 350 local affiliated support groups that are organized by volunteers who provide peer support and counseling. Juan Quam has a toll free telephone num-ber, ([phone] and an educational,  interactive website, www.ostomy.org   An ostomy is an opening in the belly (abdominal wall) made by surgery. Ostomates are people who have had this procedure. The opening (stoma) allows the kidney or bowel to discharge waste. An external pouch covers the stoma to collect waste. Pouches are are a simple bag and are odor free. Different companies have disposable or reusable pouches to fit one's lifestyle. An ostomy can either be temporary or permanent.  THERE ARE THREE MAIN TYPES OF OSTOMIES  Colostomy. A colostomy is a surgically created opening in the large intestine (colon).  Ileostomy. An ileostomy is a surgically created opening in the small intestine.  Urostomy. A urostomy is a surgically created opening to divert urine away from the bladder. FREQUENTLY ASKED QUESTIONS   Why havent you met any of these folks who have an ostomy?  Well, maybe you have! You just did not recognize them because an ostomy doesn't show. It can be kept secret if you wish. Why, maybe some of your best friends, office associates  or neighbors have an ostomy ... you never can tell.   People facing ostomy surgery have many quality-of-life questions like:  Will you bulge? Smell? Make noises? Will you feel waste leaving your body? Will you be a captive of the toilet? Will you starve? Be a social outcast? Get/stay married? Have babies? Easily bathe, go swimming, bend over?  OK, lets look at what you can expect:  Will you bulge?  Remember, without part of the intestine or bladder, and its contents, you should have a flatter tummy than before. You can expect to wear, with little exception, what you wore before surgery ... and this in-cludes tight clothing and bathing suits.  Will you smell?  Today, thanks to modern odor proof pouching systems, you can walk into an ostomy support group meeting and not smell anything that is foul or offensive. And, for those with an ileostomy or colostomy who are concerned about odor when emptying their pouch, there are in-pouch deodorants that can be used to eliminate any waste odors that may exist.  Will you make noises?  Everyone produces gas, especially if they are an air-swallower. But intestinal sounds that occur from time to time are no differ-ent than a gurgling tummy, and quite often your clothing will muffle any sounds.   Will you feel the waste discharges?  For those with a colostomy or ileostomy there might be a slight pressure when waste leaves your body, but understand that the intestines have no nerve endings, so there will be no unpleasant sensations. Those with a urostomy will  probably be unaware of any kidney drainage.  Will you be a captive of the toilet?  Immediately post-op you will spend more time in the bathroom than you will after your body recovers from surgery. Every person is different, but on average those with an ileostomy or urostomy may empty their pouches 4 to 6 times a day; a little  less if you have a colostomy. The average wear time between pouch system changes is 3  to 5 days and the changing process should take less than 30 minutes.  Will I need to be on a special diet? Most people return to their normal diet when they have recovered from surgery. Be sure to chew your food well, eat a well-balanced diet and drink plenty of fluids. If you experience problems with a certain food, wait a couple of weeks and try it again. Will there be odor and noises? Pouching systems are designed to be odor-proof or odor-resistant. There are deodorants that can be used in the pouch. Medications are also available to help reduce odor. Limit gas-producing foods and carbonated beverages. You will experience less gas and fewer noises as you heal from surgery. How much time will it take to care for my ostomy? At first, you may spend a lot of time learning about your ostomy and how to take care of it. As you become more comfortable and skilled at changing the pouching system, it will take very little time to care for it.  Will I be able to return to work? People with ostomies can perform most jobs. As soon as you have healed from surgery, you should be able to return to work. Heavy lifting (more than 10 pounds) may be discouraged.  What about intimacy? Sexual relationships and intimacy are important and fulfilling aspects of your life. They should continue after ostomy surgery. Intimacy-related concerns should be discussed openly between you and your partner.  Can I wear regular clothing? You do not need to wear special clothing. Ostomy pouches are fairly flat and barely noticeable. Elastic undergarments will not hurt the stoma or prevent the ostomy from functioning.  Can I participate in sports? An ostomy should not limit your involvement in sports. Many people with ostomies are runners, skiers, swimmers or participate in other active lifestyles. Talk with your caregiver first before doing heavy physical activity.  Will you starve?  Not if you follow doctors orders at each stage of  your post-op adjustment. There is no such thing as an ostomy diet. Some people with an ostomy will be able to eat and tolerate anything; others may find diffi-culty with some foods. Each person is an individual and must determine, by trial, what is best for them. A good practice for all is to drink plenty of water.  Will you be a social outcast?  Have you met anyone who has an ostomy and is a social outcast? Why should you be the first? Only your attitude and self image will effect how you are treated. No confi-dent person is an Occupational psychologist.   PROFESSIONAL HELP  Resources are available if you need help or have questions about your ostomy.    Specially trained nurses called Wound, Ostomy Continence Nurses (WOCN) are available for consultation in most major medical centers.   Consider getting an ostomy consult with Cena Benton at [facility] to help troubleshoot collagen fittings and other issues with your ostomy: [phone]   The [place] (UOA) is a group made up of many local  chapters throughout the [place]. These local groups hold meetings and provide support to prospective and existing ostomates. They sponsor educational events and have qualified visitors to make personal or telephone visits. Contact the UOA for the chapter nearest you and for other educational publications.  More detailed information can be found in Colostomy Guide, a publication of the [employer] (UOA). Contact UOA at 1-775 658 1436 or visit their web site at [email or web address]/. The website contains links to other sites, suppliers and resources. Document Released: 02/27/2003 Document Revised: 05/19/2011 Document Reviewed: 06/28/2008 [facility] Patient Information 2013 [place].  ABDOMINAL SURGERY: POST OP INSTRUCTIONS  1. DIET: Follow a light bland diet the first 24 hours after arrival home, such as soup, liquids, crackers, etc.  Be sure to include lots of fluids daily.   Avoid fast food or heavy meals as your are more likely to get nauseated.  Eat a low fat the next few days after surgery.   2. Take your usually prescribed home medications unless otherwise directed. 3. PAIN CONTROL: a. Pain is best controlled by a usual combination of three different methods TOGETHER: i. Ice/Heat ii. Over the counter pain medication iii. Prescription pain medication b. Most patients will experience some swelling and bruising around the incisions.  Ice packs or heating pads (30-60 minutes up to 6 times a day) will help. Use ice for the first few days to help decrease swelling and bruising, then switch to heat to help relax tight/sore spots and speed recovery.  Some people prefer to use ice alone, heat alone, alternating between ice & heat.  Experiment to what works for you.  Swelling and bruising can take several weeks to resolve.   c. It is helpful to take an over-the-counter pain medication regularly for the first few weeks.  Choose one of the following that works best for you: i. Naproxen (Aleve, etc)  Two 26m tabs twice a day ii. Ibuprofen (Advil, etc) Three 2063mtabs four times a day (every meal & bedtime) iii. Acetaminophen (Tylenol, etc) 500-65089mour times a day (every meal & bedtime) d. A  prescription for pain medication (such as oxycodone, hydrocodone, etc) should be given to you upon discharge.  Take your pain medication as prescribed.  i. If you are having problems/concerns with the prescription medicine (does not control pain, nausea, vomiting, rash, itching, etc), please call us [phone] see if we need to switch you to a different pain medicine that will work better for you and/or control your side effect better. ii. If you need a refill on your pain medication, please contact your pharmacy.  They will contact our office to request authorization. Prescriptions will not be filled after 5 pm or on week-ends. 4. Avoid getting constipated.  Between the surgery  and the pain medications, it is common to experience some constipation.  Increasing fluid intake and taking a fiber supplement (such as Metamucil, Citrucel, FiberCon, MiraLax, etc) 1-2 times a day regularly will usually help prevent this problem from occurring.  A mild laxative (prune juice, Milk of Magnesia, MiraLax, etc) should be taken according to package directions if there are no bowel movements after 48 hours.   5. Watch out for diarrhea.  If you have many loose bowel movements, simplify your diet to bland foods & liquids for a few days.  Stop any stool softeners and decrease your fiber supplement.  Switching to mild anti-diarrheal medications (Kayopectate, Pepto Bismol) can help.  If this worsens or does not improve, please call  us. 6. Wash / shower every day.  You may shower over the incision / wound.  Avoid baths until the skin is fully healed.  Continue to shower over incision(s) after the dressing is off. 7. Remove your waterproof bandages 5 days after surgery.  You may leave the incision open to air.  You may replace a dressing/Band-Aid to cover the incision for comfort if you wish. 8. ACTIVITIES as tolerated:   a. You may resume regular (light) daily activities beginning the next day--such as daily self-care, walking, climbing stairs--gradually increasing activities as tolerated.  If you can walk 30 minutes without difficulty, it is safe to try more intense activity such as jogging, treadmill, bicycling, low-impact aerobics, swimming, etc. b. Save the most intensive and strenuous activity for last such as sit-ups, heavy lifting, contact sports, etc  Refrain from any heavy lifting or straining until you are off narcotics for pain control.   c. DO NOT PUSH THROUGH PAIN.  Let pain be your guide: If it hurts to do something, don't do it.  Pain is your body warning you to avoid that activity for another week until the pain goes down. d. You may drive when you are no longer taking prescription pain  medication, you can comfortably wear a seatbelt, and you can safely maneuver your car and apply brakes. e. Dennis Bast may have sexual intercourse when it is comfortable.  9. FOLLOW UP in our office a. Please call [facility] at [phone] to set up an appointment to see your surgeon in the office for a follow-up appointment approximately 1-2 weeks after your surgery. b. Make sure that you call for this appointment the day you arrive home to insure a convenient appointment time. 10. IF YOU HAVE DISABILITY OR FAMILY LEAVE FORMS, BRING THEM TO THE OFFICE FOR PROCESSING.  DO NOT GIVE THEM TO YOUR DOCTOR.   WHEN TO CALL us [phone]: 1. Poor pain control 2. Reactions / problems with new medications (rash/itching, nausea, etc)  3. Fever over 101.5 F (38.5 C) 4. Inability to urinate 5. Nausea and/or vomiting 6. Worsening swelling or bruising 7. Continued bleeding from incision. 8. Increased pain, redness, or drainage from the incision  The clinic staff is available to answer your questions during regular business hours (8:30am-5pm).  Please dont hesitate to call and ask to speak to one of our nurses for clinical concerns.   A surgeon from [facility] is always on call at the hospitals   If you have a medical emergency, go to the nearest emergency room or call 911.    [facility], [place], [place], [place], [place]  [address] ? MAIN: [phone] ? TOLL FREE: [phone] ? FAX [phone] [email or web address]  Colorectal Cancer Colorectal cancer is an abnormal growth of tissue (tumor) in the colon or rectum that is cancerous (malignant). Unlike noncancerous (benign) tumors, malignant tumors can spread to other parts of your body. The colon is the large bowel or large intestine. The rectum is the last several inches of the colon.  RISK FACTORS The exact cause of colorectal cancer is unknown. However, the following factors may increase your  chances of getting colorectal cancer:   Age older than 36 years.   Abnormal growths (polyps) on the inner wall of the colon or rectum.   Diabetes.   African American race.   Family history of hereditary nonpolyposis colorectal cancer. This condition is caused by changes in the genes that are responsible for  repairing mismatched DNA.   Personal history of cancer. A person who has already had colorectal cancer may develop it a second time. Also, women with a history of ovarian, uterine, or breast cancer are at a somewhat higher risk of developing colorectal cancer.  Certain hereditary conditions.  Eating a diet that is high in fat (especially animal fat) and low in fiber, fruits, and vegetables.  Sedentary lifestyle.  Inflammatory bowel disease, including ulcerative colitis and Crohn disease.   Smoking.   Excessive alcohol use.  SYMPTOMS Early colorectal cancer often does not cause symptoms. As the cancer grows, symptoms may include:   Changes in bowel habits.  Diarrhea.   Constipation.   Feeling like the bowel does not empty completely after a bowel movement.   Blood in the stool.   Stools that are narrower than usual.   Abdominal discomfort, pain, bloating, fullness, or cramps.  Frequent gas pain.   Unexplained weight loss.   Constant tiredness.   Nausea and vomiting.  DIAGNOSIS  Your health care provider will ask about your medical history. He or she may also perform a number of procedures, such as:   A physical exam.  A digital rectal exam.  A fecal occult blood test.  A barium enema.  Blood tests.   X-rays.   Imaging tests, such as CT scans or MRIs.   Taking a tissue sample (biopsy) from your colon or rectum to look for cancer cells.   A sigmoidoscopy to view the inside of the last part of your colon.   A colonoscopy to view the inside of your entire colon.   An endorectal ultrasound to see how deep a rectal tumor has  grown and whether the cancer has spread to lymph nodes or other nearby tissues.  Your cancer will be staged to determine its severity and extent. Staging is a careful attempt to find out the size of the tumor, whether the cancer has spread, and if so, to what parts of the body. You may need to have more tests to determine the stage of your cancer. The test results will help determine what treatment plan is best for you.   Stage 0 The cancer is found only in the innermost lining of the colon or rectum.   Stage I The cancer has grown into the inner wall of the colon or rectum. The cancer has not yet reached the outer wall of the colon.   Stage II The cancer extends more deeply into or through the wall of the colon or rectum. It may have invaded nearby tissue, but cancer cells have not spread to the lymph nodes.   Stage III The cancer has spread to nearby lymph nodes but not to other parts of the body.   Stage IV The cancer has spread to other parts of the body, such as the liver or lungs.  Your health care provider may tell you the detailed stage of your cancer, which includes both a number and a letter.  TREATMENT  Depending on the type and stage, colorectal cancer may be treated with surgery, radiation therapy, chemotherapy, targeted therapy, or radiofrequency ablation. Some people have a combination of these therapies. Surgery may be done to remove the polyps from your colon. In early stages, your health care provider may be able to do this during a colonoscopy. In later stages, surgery may be done to remove part of your colon.  HOME CARE INSTRUCTIONS   Only take over-the-counter or prescription medicines for pain,  discomfort, or fever as directed by your health care provider.   Maintain a healthy diet.   Consider joining a support group. This may help you learn to cope with the stress of having colorectal cancer.   Seek advice to help you manage treatment of side effects.   Keep  all follow-up appointments as directed by your health care provider.   Inform your cancer specialist if you are admitted to the hospital.  SEEK MEDICAL CARE IF:  Your diarrhea or constipation does not go away.   Your bowel habits change.  You have increased abdominal pain.   You notice new fatigue or weakness.  You lose weight. Document Released: 02/24/2005 Document Revised: 10/27/2012 Document Reviewed: 08/19/2012 [facility] Patient Information 2014 [place], [place].  Colorectal Cancer Colorectal cancer is an abnormal growth of tissue (tumor) in the colon or rectum that is cancerous (malignant). Unlike noncancerous (benign) tumors, malignant tumors can spread to other parts of your body. The colon is the large bowel or large intestine. The rectum is the last several inches of the colon.  RISK FACTORS The exact cause of colorectal cancer is unknown. However, the following factors may increase your chances of getting colorectal cancer:   Age older than 50 years.   Abnormal growths (polyps) on the inner wall of the colon or rectum.   Diabetes.   African American race.   Family history of hereditary nonpolyposis colorectal cancer. This condition is caused by changes in the genes that are responsible for repairing mismatched DNA.   Personal history of cancer. A person who has already had colorectal cancer may develop it a second time. Also, women with a history of ovarian, uterine, or breast cancer are at a somewhat higher risk of developing colorectal cancer.  Certain hereditary conditions.  Eating a diet that is high in fat (especially animal fat) and low in fiber, fruits, and vegetables.  Sedentary lifestyle.  Inflammatory bowel disease, including ulcerative colitis and Crohn disease.   Smoking.   Excessive alcohol use.  SYMPTOMS Early colorectal cancer often does not cause symptoms. As the cancer grows, symptoms may include:   Changes in bowel  habits.  Diarrhea.   Constipation.   Feeling like the bowel does not empty completely after a bowel movement.   Blood in the stool.   Stools that are narrower than usual.   Abdominal discomfort, pain, bloating, fullness, or cramps.  Frequent gas pain.   Unexplained weight loss.   Constant tiredness.   Nausea and vomiting.  DIAGNOSIS  Your health care provider will ask about your medical history. He or she may also perform a number of procedures, such as:   A physical exam.  A digital rectal exam.  A fecal occult blood test.  A barium enema.  Blood tests.   X-rays.   Imaging tests, such as CT scans or MRIs.   Taking a tissue sample (biopsy) from your colon or rectum to look for cancer cells.   A sigmoidoscopy to view the inside of the last part of your colon.   A colonoscopy to view the inside of your entire colon.   An endorectal ultrasound to see how deep a rectal tumor has grown and whether the cancer has spread to lymph nodes or other nearby tissues.  Your cancer will be staged to determine its severity and extent. Staging is a careful attempt to find out the size of the tumor, whether the cancer has spread, and if so, to what parts of the body.  You may need to have more tests to determine the stage of your cancer. The test results will help determine what treatment plan is best for you.   Stage 0 The cancer is found only in the innermost lining of the colon or rectum.   Stage I The cancer has grown into the inner wall of the colon or rectum. The cancer has not yet reached the outer wall of the colon.   Stage II The cancer extends more deeply into or through the wall of the colon or rectum. It may have invaded nearby tissue, but cancer cells have not spread to the lymph nodes.   Stage III The cancer has spread to nearby lymph nodes but not to other parts of the body.   Stage IV The cancer has spread to other parts of the body, such as  the liver or lungs.  Your health care provider may tell you the detailed stage of your cancer, which includes both a number and a letter.  TREATMENT  Depending on the type and stage, colorectal cancer may be treated with surgery, radiation therapy, chemotherapy, targeted therapy, or radiofrequency ablation. Some people have a combination of these therapies. Surgery may be done to remove the polyps from your colon. In early stages, your health care provider may be able to do this during a colonoscopy. In later stages, surgery may be done to remove part of your colon.  HOME CARE INSTRUCTIONS   Only take over-the-counter or prescription medicines for pain, discomfort, or fever as directed by your health care provider.   Maintain a healthy diet.   Consider joining a support group. This may help you learn to cope with the stress of having colorectal cancer.   Seek advice to help you manage treatment of side effects.   Keep all follow-up appointments as directed by your health care provider.   Inform your cancer specialist if you are admitted to the hospital.  SEEK MEDICAL CARE IF:  Your diarrhea or constipation does not go away.   Your bowel habits change.  You have increased abdominal pain.   You notice new fatigue or weakness.  You lose weight. Document Released: 02/24/2005 Document Revised: 10/27/2012 Document Reviewed: 08/19/2012 [facility] Patient Information 2014 [place].

## 2013-07-19 ENCOUNTER — Encounter (INDEPENDENT_AMBULATORY_CARE_PROVIDER_SITE_OTHER): Payer: Self-pay | Admitting: General Surgery

## 2013-07-19 ENCOUNTER — Ambulatory Visit (INDEPENDENT_AMBULATORY_CARE_PROVIDER_SITE_OTHER): Payer: Medicare Other | Admitting: General Surgery

## 2013-07-19 VITALS — BP 122/78 | HR 62 | Temp 98.7°F | Resp 17 | Ht 64.0 in | Wt 144.4 lb

## 2013-07-19 DIAGNOSIS — Z4802 Encounter for removal of sutures: Secondary | ICD-10-CM

## 2013-07-19 NOTE — Patient Instructions (Signed)
Patient came in today for staple removal. I removed all the staples and placed steri streps over the surgery site. And told the patient she can take them off next Tuesday and I wrote it down for the patient so she could remember. She asked about an follow up with Dr Harlow Asa and I told her that I will send an message to his nurse Jenny Reichmann and she will call her with an apt for an follow up from surgery. The patient has an apt to see Dr Benay Spice on 07-29-13 and she is aware of that apt.

## 2013-07-20 ENCOUNTER — Telehealth (INDEPENDENT_AMBULATORY_CARE_PROVIDER_SITE_OTHER): Payer: Self-pay

## 2013-07-20 NOTE — Telephone Encounter (Signed)
Pt home doing well. PO appt with Dr Harlow Asa made.

## 2013-07-29 ENCOUNTER — Telehealth: Payer: Self-pay | Admitting: Oncology

## 2013-07-29 ENCOUNTER — Ambulatory Visit (HOSPITAL_BASED_OUTPATIENT_CLINIC_OR_DEPARTMENT_OTHER): Payer: Medicare Other | Admitting: Oncology

## 2013-07-29 ENCOUNTER — Encounter: Payer: Self-pay | Admitting: Oncology

## 2013-07-29 ENCOUNTER — Ambulatory Visit (HOSPITAL_BASED_OUTPATIENT_CLINIC_OR_DEPARTMENT_OTHER): Payer: Medicare Other

## 2013-07-29 VITALS — BP 128/64 | HR 85 | Temp 97.9°F | Resp 18 | Ht 64.0 in | Wt 144.2 lb

## 2013-07-29 DIAGNOSIS — C183 Malignant neoplasm of hepatic flexure: Secondary | ICD-10-CM

## 2013-07-29 DIAGNOSIS — C182 Malignant neoplasm of ascending colon: Secondary | ICD-10-CM

## 2013-07-29 DIAGNOSIS — R911 Solitary pulmonary nodule: Secondary | ICD-10-CM

## 2013-07-29 DIAGNOSIS — I10 Essential (primary) hypertension: Secondary | ICD-10-CM

## 2013-07-29 DIAGNOSIS — Z85828 Personal history of other malignant neoplasm of skin: Secondary | ICD-10-CM

## 2013-07-29 NOTE — Telephone Encounter (Signed)
gv adn printed apt sched and avs for pt for May and June °

## 2013-07-29 NOTE — Progress Notes (Signed)
[place] Patient Consult   Referring MD: Maysen Bonsignore 80 y.o.  Jun 05, 1924    Reason for Referral: Colon cancer   HPI: She reports a 3-4 week history of abdominal pain prior to presenting to the emergency room with nausea and vomiting on 07/06/2013. A CT of the abdomen and pelvis revealed a 4 mm nodule in the right middle lobe and a 4 mm nodule in the right lower lobe. Calcifications in the liver and spleen were felt to be related to prior granulomatous infection. A focal area of wall thickening was noted at the hepatic flexure. The more proximal colon is fluid filled and dilated. Multiple mid to distal small bowel loops were also fluid filled and dilated.  She was taken to a colonoscopy by Dr. Penelope Coop on 07/08/2013. The quality of the preparation was poor. An obstructing mass was noted at the hepatic flexure. A biopsy was obtained in the area was tattooed. The pathology confirmed invasive poorly differentiated adenocarcinoma.  Dr. Harlow Asa was consult at and she was taken to the operating room 07/09/2013 for a right colectomy and end ileostomy. No evidence of hepatic metastases. No evidence of omental or peritoneal disease. A circumferential mass was noted in the hepatic flexure. The bowel was transected approximately 15 cm distal to the obstruction. The terminal ileum was transected and the ileostomy was created.  The pathology [id number]) confirmed an invasive poorly differentiated adenocarcinoma invading through the muscular propria into pericolonic fatty tissue. Lymphovascular invasion was noted. The resection margins were negative. 4 of 15 lymph nodes were positive for metastatic carcinoma. There were 2 tumor deposits. No macroscopic tumor perforation.  She reports no problem with the ileostomy. The stool is formed and she empties her bag 3-4 times per day. She is taking Imodium twice daily.  Past Medical History  Diagnosis Date  . Hypertension     . Hyperlipidemia     .   G3 P3   .   History of multiple nonmelanoma skin cancers   Past Surgical History  Procedure Laterality Date  . Total hip arthroplasty    . Colonoscopy with propofol N/A 07/08/2013    Procedure: COLONOSCOPY WITH PROPOFOL;  Surgeon: Wonda Horner, MD;  Location: [facility];  Service: Endoscopy;  Laterality: N/A;  . Partial colectomy Right 07/09/2013    Procedure: PARTIAL COLECTOMY AND EXCISION OF NEVUS FROM RIGHT ABDOMINAL WALL;  Surgeon: Earnstine Regal, MD;  Location: [facility];  Service: General;  Laterality: Right;  . Colon surgery      Medications: Reviewed  Allergies: No Known Allergies  Family history: Her sister had lung cancer was a smoker. She had 2 sisters and one brother. No other family history of cancer.  Social History:   She lives alone in [place]. She previously worked in an office occupation. She does not use tobacco or alcohol. No transfusion history. No risk factor for HIV or hepatitis.   ROS:   Positives include: 3 to four-week history of abdominal pain, nausea, constipation, and anorexia prior to surgery. Urinary tract infection a few days prior to hospital admission in April 2015  A complete ROS was otherwise negative.  Physical Exam:  Blood pressure 128/64, pulse 85, temperature 97.9 F (36.6 C), temperature source Oral, resp. rate 18, height 5\' 4"  (1.626 m), weight 144 lb 3.2 oz (65.409 kg).  HEENT: Oropharynx without visible mass, neck without mass Lungs: Clear bilaterally Cardiac: Regular rate and rhythm Abdomen: No hepatomegaly, right lower  quadrant ileostomy with formed brown stool. No splenomegaly. No mass. Healed midline incision.  Vascular: No leg edema Lymph nodes: No cervical, supraclavicular, axillary, or inguinal nodes Neurologic: Alert and oriented, the motor exam appears intact in the upper and lower extremities Skin: Multiple benign appearing moles over the trunk Musculoskeletal: No spine  tenderness   LAB:  CBC  Lab Results  Component Value Date   WBC 9.0 07/11/2013   HGB 9.8* 07/11/2013   HCT 29.5* 07/11/2013   MCV 79.1 07/11/2013   PLT PLATELET CLUMPS NOTED ON SMEAR 07/11/2013   NEUTROABS 8.4* 07/06/2013     CMP      Component Value Date/Time   NA 139 07/15/2013 0355   K 4.1 07/15/2013 0355   CL 103 07/15/2013 0355   CO2 23 07/15/2013 0355   GLUCOSE 112* 07/15/2013 0355   BUN 9 07/15/2013 0355   CREATININE 0.71 07/15/2013 0355   CALCIUM 9.5 07/15/2013 0355   PROT 6.3 07/07/2013 0404   ALBUMIN 3.0* 07/07/2013 0404   AST 16 07/07/2013 0404   ALT 10 07/07/2013 0404   ALKPHOS 62 07/07/2013 0404   BILITOT 0.3 07/07/2013 0404   GFRNONAA 75* 07/15/2013 0355   GFRAA 87* 07/15/2013 0355    Lab Results  Component Value Date   CEA 2.1 07/07/2013    Imaging:  As per history of present illness   Assessment/Plan:   1. Stage IIIB (T3 N2a) poorly different it adenocarcinoma of the right colon, status post a right colectomy and end ileostomy 07/09/2013  For a 15 lymph nodes positive for metastatic carcinoma and 2 tumor deposits 2. History of multiple nonmelanoma skin cancers 3. History of hypertension 4. Indeterminate 4 mm right lung base nodule on the CT abdomen 07/06/2013   Disposition:   Ms. Barbe has been diagnosed with stage III colon cancer. I discussed the prognosis and adjuvant treatment options with Ms. Rossi and her daughter. We reviewed the details of the surgical pathology report. There is a significant chance of developing recurrent colon cancer over the next several years. We discussed the benefit associated with 5-fluorouracil-based chemotherapy in this setting. I also reviewed the expected additional improvement in disease-free survival with oxaliplatin. She understands it is unclear whether oxaliplatin is beneficial in elderly patients with resected colon cancer.  I recommend adjuvant capecitabine. We reviewed the potential toxicities associated with capecitabine including  the chance for nausea, mucositis, diarrhea, and hematologic toxicity. We discussed the rash, hyperpigmentation, and hand/foot syndrome associated with capecitabine. She agrees to proceed. She will attend a chemotherapy teaching class. She will discontinue capecitabine and contact us if she develops diarrhea.  She will be scheduled for a staging chest CT to be sure she does not have lung metastases.  The plan is to begin a first cycle of adjuvant capecitabine on 08/08/2013. She will return for an office and lab visit 08/24/2013.  Approximately 50 minutes were spent with the patient today. The majority of the time was used for counseling and coordination of care.  Ladell Pier 07/29/2013, 5:21 PM

## 2013-07-29 NOTE — [facility] Oncology Navigator Note (Signed)
Met with Anselm Lis and family. Explained role of nurse navigator. Educational information provided on colon cancer  Referral made to dietician for diet education. [place] resources provided to patient, including SW service and support group information.    She was given information on how to contact [place] at [facility] if needed re: ostomy care.  Contact names and phone numbers were provided for entire [facility] team.  Teach back method was used.  No barriers to care identified.  Will continue to follow as needed.

## 2013-07-29 NOTE — Progress Notes (Signed)
Checked in new patient with no financial issues because she has not seen the dr. She has not been out of country.

## 2013-08-02 ENCOUNTER — Encounter (INDEPENDENT_AMBULATORY_CARE_PROVIDER_SITE_OTHER): Payer: Self-pay | Admitting: Surgery

## 2013-08-02 ENCOUNTER — Encounter: Payer: Self-pay | Admitting: Oncology

## 2013-08-02 ENCOUNTER — Other Ambulatory Visit: Payer: Self-pay | Admitting: *Deleted

## 2013-08-02 ENCOUNTER — Ambulatory Visit (INDEPENDENT_AMBULATORY_CARE_PROVIDER_SITE_OTHER): Payer: Medicare Other | Admitting: Surgery

## 2013-08-02 VITALS — BP 128/80 | HR 97 | Temp 98.4°F | Ht 64.0 in | Wt 144.0 lb

## 2013-08-02 DIAGNOSIS — C182 Malignant neoplasm of ascending colon: Secondary | ICD-10-CM

## 2013-08-02 MED ORDER — CAPECITABINE 500 MG PO TABS: 1500.0000 mg | ORAL_TABLET | Freq: Two times a day (BID) | ORAL | Status: DC

## 2013-08-02 NOTE — Patient Instructions (Signed)
  CARE OF INCISION   Apply cocoa butter/vitamin E cream (Palmer's brand) to your incision 2 - 3 times daily.  Massage cream into incision for one minute with each application.  Use sunscreen (50 SPF or higher) for first 6 months after surgery if area is exposed to sun.  You may alternate Mederma or other scar reducing cream with cocoa butter cream if desired.       Dianca Owensby M. Anshu Wehner, MD, FACS      [facility]      Office: [phone]    

## 2013-08-02 NOTE — Progress Notes (Signed)
Faxed xeloda prescription to [facility] °

## 2013-08-02 NOTE — Progress Notes (Signed)
General Surgery [facility]  Chief Complaint  Patient presents with  . Routine Post Op    right colectomy with ileostomy 07/09/2013    HISTORY: Patient is an 80 year old female underwent right colectomy for adenocarcinoma of the colon. This was performed on 07/09/2013. She has done well at home. She is taking care of her ileostomy with the help of home care nurses. She is scheduled to go to a chemotherapy class at the cancer center later this week. She is anxious to begin driving again.  EXAM: Abdomen is soft nontender without distention. Midline incision has healed nicely. Ileostomy is viable in the right lower quadrant and has liquid succus in the bag. There is no tenderness. There are no masses.  IMPRESSION: Status post right colectomy for adenocarcinoma of the colon with obstruction  PLAN: Patient will continue evaluation with her medical oncologist. She is anticipating 6-7 months of treatment. She is interested and takedown of her ileostomy following completion of chemotherapy.  Patient will return for surgical care in 3 months.  Earnstine Regal, MD, [facility]   Visit Diagnoses: 1. Cancer of hepatic flexure with obstruction s/p colectomy/ileostomy 07/09/2013

## 2013-08-04 ENCOUNTER — Encounter: Payer: Self-pay | Admitting: Oncology

## 2013-08-04 ENCOUNTER — Other Ambulatory Visit: Payer: Medicare Other

## 2013-08-04 ENCOUNTER — Encounter: Payer: Self-pay | Admitting: *Deleted

## 2013-08-04 NOTE — Progress Notes (Signed)
Per [place] at [facility] this patient is approved for Xeloda 08/02/13-08/02/14  7500.00. I will advised billing and medical records.

## 2013-08-05 ENCOUNTER — Ambulatory Visit (HOSPITAL_COMMUNITY)
Admission: RE | Admit: 2013-08-05 | Discharge: 2013-08-05 | Disposition: A | Discharge status: Home / Self Care | Payer: Medicare Other | Source: Ambulatory Visit | Attending: Oncology | Admitting: Oncology

## 2013-08-05 DIAGNOSIS — C182 Malignant neoplasm of ascending colon: Secondary | ICD-10-CM

## 2013-08-05 DIAGNOSIS — J984 Other disorders of lung: Secondary | ICD-10-CM | POA: Insufficient documentation

## 2013-08-05 DIAGNOSIS — I2584 Coronary atherosclerosis due to calcified coronary lesion: Secondary | ICD-10-CM | POA: Insufficient documentation

## 2013-08-05 DIAGNOSIS — R109 Unspecified abdominal pain: Secondary | ICD-10-CM | POA: Insufficient documentation

## 2013-08-05 DIAGNOSIS — R112 Nausea with vomiting, unspecified: Secondary | ICD-10-CM | POA: Insufficient documentation

## 2013-08-05 NOTE — Telephone Encounter (Signed)
[facility] sent facsimile confirmation of Capecitabine prescription shipment.  Capecitabine was shipped on 08-04-2013 with next business day delivery.    

## 2013-08-09 ENCOUNTER — Telehealth: Payer: Self-pay | Admitting: *Deleted

## 2013-08-09 ENCOUNTER — Other Ambulatory Visit: Payer: Self-pay | Admitting: *Deleted

## 2013-08-09 NOTE — Telephone Encounter (Signed)
Received phone call from patient stating that she had to have a tooth extracted on 08/08/13 and now she is on antibiotics.  She was supposed to start Xeloda on 08/08/13.  She is calling to request that she begin Xeloda on 08/15/13 after her antibiotics are finished.  Dr. Benay Spice informed of above.  Per Dr. Benay Spice, "okay to start Xeloda on 08/15/13.  Change her follow-up appointment and lab from 08/24/13 to 08/31/13".  Patient was informed and she verbalized comprehension. She understands to call if she has not received an appointment within 2-3 days.

## 2013-08-10 ENCOUNTER — Telehealth: Payer: Self-pay | Admitting: Oncology

## 2013-08-10 NOTE — Telephone Encounter (Signed)
s.w pt and advised on 6.24 appt per MD to change

## 2013-08-17 ENCOUNTER — Ambulatory Visit: Payer: Medicare Other | Admitting: Nutrition

## 2013-08-17 ENCOUNTER — Telehealth: Payer: Self-pay | Admitting: Pharmacist

## 2013-08-17 NOTE — Progress Notes (Signed)
Pt is a 80 y/o female with Dx of Colon CA that is taking Xeloda.    PMH: HTN  Meds include Norvasc, Lipitor, and Xeloda  No new labs.   HT 64"  Wt 144.6#  UBW 150#  BMI 24.89  Pt with new ileostomy, but managing well.  C/O some changes in taste, but started before taking Xeloda, thinks it may be related to the anesthesia.  Appetite and po intake good, but not eating as many sweets.  Drinking 6-8 c of liquids/day.    Nutrition Dx:  Food and nutrition related knowledge deficit related to new diagnosis of Colon CA and related tx as evidenced by no prior need for nutrition related information.   Intervention: Reviewed diet for ileostomy, which foods to avoid and ways to cope with side effects.  Educated pt on coping with potential side effects related to tx, including sore mouth, loss of appetite and diarrhea.  Encouraged increased fluids 8-10c /day.  Pt may also benefit from small frequent meals.  Questions answers.  RD phone number provided.  Monitoring, evaluation, goals:  Pt will tolerate oral intake to minimize nutrition related side effects to promote healing and wt stabilization.   Next visit:  To be scheduled.

## 2013-08-17 NOTE — Telephone Encounter (Signed)
I called [name] this afternoon to answer any questions regarding her new Xeloda prescription. Crystal Scott, RN had requested that I call as patient had questions regarding the Xeloda. I reviewed the information on Xeloda (Capecitabine) including administration, dosing, common adverse effects, when to contact our office, the importance of compliance, and Drug/food interactions . Pt was very appreciative and voiced understanding. It was a pleasure to talk with [name] today. She started the Xeloda on Monday 08/15/13 and states she has had no issues thus far and has been taking 3 tablets twice daily with meals 12 hours apart. For 2 weeks followed by 1 week off. She knows to call if she has any issues or questions in the future.  Thank you! Montel Clock, PharmD

## 2013-08-24 ENCOUNTER — Other Ambulatory Visit: Payer: Medicare Other

## 2013-08-24 ENCOUNTER — Ambulatory Visit: Payer: Medicare Other | Admitting: Nurse Practitioner

## 2013-08-25 ENCOUNTER — Other Ambulatory Visit: Payer: Self-pay | Admitting: *Deleted

## 2013-08-25 DIAGNOSIS — C182 Malignant neoplasm of ascending colon: Secondary | ICD-10-CM

## 2013-08-25 MED ORDER — CAPECITABINE 500 MG PO TABS: 1500.0000 mg | ORAL_TABLET | Freq: Two times a day (BID) | ORAL | Status: DC

## 2013-08-25 NOTE — Telephone Encounter (Signed)
THIS REFILL REQUEST FOR CAPECITABINE WAS GIVEN TO DR.SHERRILL'S NURSE, SUSAN COWARD,RN. 

## 2013-08-26 NOTE — Telephone Encounter (Signed)
RECEIVED A FAX FROM [facility] CONCERNING A CONFIRMATION OF FACS[facility] RECEIPT FOR PT. REFERRAL. 

## 2013-08-31 ENCOUNTER — Ambulatory Visit: Payer: Medicare Other | Admitting: Nutrition

## 2013-08-31 ENCOUNTER — Other Ambulatory Visit (HOSPITAL_BASED_OUTPATIENT_CLINIC_OR_DEPARTMENT_OTHER): Payer: Medicare Other

## 2013-08-31 ENCOUNTER — Ambulatory Visit (HOSPITAL_BASED_OUTPATIENT_CLINIC_OR_DEPARTMENT_OTHER): Payer: Medicare Other | Admitting: Nurse Practitioner

## 2013-08-31 ENCOUNTER — Telehealth: Payer: Self-pay | Admitting: Oncology

## 2013-08-31 VITALS — BP 141/63 | HR 88 | Temp 98.3°F | Resp 18 | Ht 64.0 in | Wt 144.9 lb

## 2013-08-31 DIAGNOSIS — C182 Malignant neoplasm of ascending colon: Secondary | ICD-10-CM

## 2013-08-31 DIAGNOSIS — I10 Essential (primary) hypertension: Secondary | ICD-10-CM

## 2013-08-31 DIAGNOSIS — C183 Malignant neoplasm of hepatic flexure: Secondary | ICD-10-CM

## 2013-08-31 DIAGNOSIS — C779 Secondary and unspecified malignant neoplasm of lymph node, unspecified: Secondary | ICD-10-CM

## 2013-08-31 DIAGNOSIS — R21 Rash and other nonspecific skin eruption: Secondary | ICD-10-CM

## 2013-08-31 DIAGNOSIS — Z85828 Personal history of other malignant neoplasm of skin: Secondary | ICD-10-CM

## 2013-08-31 DIAGNOSIS — R911 Solitary pulmonary nodule: Secondary | ICD-10-CM

## 2013-08-31 LAB — CBC WITH DIFFERENTIAL/PLATELET
BASO%: 0.5 % (ref 0.0–2.0)
Basophils Absolute: 0 10*3/uL (ref 0.0–0.1)
EOS%: 4.2 % (ref 0.0–7.0)
Eosinophils Absolute: 0.3 10*3/uL (ref 0.0–0.5)
HEMATOCRIT: 34.2 % — AB (ref 34.8–46.6)
HGB: 11.2 g/dL — ABNORMAL LOW (ref 11.6–15.9)
LYMPH%: 27.9 % (ref 14.0–49.7)
MCH: 27.6 pg (ref 25.1–34.0)
MCHC: 32.9 g/dL (ref 31.5–36.0)
MCV: 83.9 fL (ref 79.5–101.0)
MONO#: 0.4 10*3/uL (ref 0.1–0.9)
MONO%: 6.2 % (ref 0.0–14.0)
NEUT#: 4 10*3/uL (ref 1.5–6.5)
NEUT%: 61.2 % (ref 38.4–76.8)
Platelets: 298 10*3/uL (ref 145–400)
RBC: 4.07 10*6/uL (ref 3.70–5.45)
RDW: 16.8 % — AB (ref 11.2–14.5)
WBC: 6.6 10*3/uL (ref 3.9–10.3)
lymph#: 1.8 10*3/uL (ref 0.9–3.3)

## 2013-08-31 LAB — COMPREHENSIVE METABOLIC PANEL (CC13)
ALT: 9 U/L (ref 0–55)
ANION GAP: 10 meq/L (ref 3–11)
AST: 14 U/L (ref 5–34)
Albumin: 3.8 g/dL (ref 3.5–5.0)
Alkaline Phosphatase: 71 U/L (ref 40–150)
BUN: 15.6 mg/dL (ref 7.0–26.0)
CALCIUM: 10 mg/dL (ref 8.4–10.4)
CO2: 26 meq/L (ref 22–29)
Chloride: 104 mEq/L (ref 98–109)
Creatinine: 0.9 mg/dL (ref 0.6–1.1)
Glucose: 139 mg/dl (ref 70–140)
Potassium: 3.7 mEq/L (ref 3.5–5.1)
SODIUM: 141 meq/L (ref 136–145)
TOTAL PROTEIN: 7.3 g/dL (ref 6.4–8.3)
Total Bilirubin: 0.48 mg/dL (ref 0.20–1.20)

## 2013-08-31 NOTE — Progress Notes (Signed)
Nutrition followup completed with patient.  Patient feels well and weight is stable at 144.9 pounds.  She has completed cycle 1 of Xeloda. Her ostomy output is appropriate per patient.  She has a good appetite.  States she sometimes has a bad taste in her mouth.  Nutrition diagnosis: Food and nutrition related knowledge deficit continues but has improved.  Intervention: Enforced diet for ileostomy and encouraged increased fluid intake.  Recommended minimum 10 cups fluid daily.  Reviewed strategies for dealing with taste alterations.  Fact Sheets provided.  Teach back method used.  Contact information was given.  Monitoring, evaluation, goals: Patient is tolerating oral intake and has had weight stabilization.  Next visit: Patient to contact me with questions or concerns.

## 2013-08-31 NOTE — Progress Notes (Signed)
  [place] OFFICE PROGRESS NOTE   Diagnosis:  Colon cancer.  INTERVAL HISTORY:   [name] returns as scheduled. She completed cycle 1 adjuvant Xeloda beginning 08/15/2013. She denies nausea/vomiting. No mouth sores. She does note a "bad taste" in her mouth. No diarrhea. She is managing the ileostomy well. No hand or foot pain or redness. She has noted increased prominence of a pre-existing rash over the forearms. She has a good appetite. No abdominal pain.  Objective:  Vital signs in last 24 hours:  Blood pressure 141/63, pulse 88, temperature 98.3 F (36.8 C), temperature source Oral, resp. rate 18, height 5\' 4"  (1.626 m), weight 144 lb 14.4 oz (65.726 kg), SpO2 100.00%.    HEENT: No thrush or ulcerations. Resp: Lungs clear. Cardio: Regular cardiac rhythm. GI: Abdomen soft and nontender. No hepatomegaly. Ileostomy right abdomen with thick stool in the collection bag. Healed midline incision. Vascular: No leg edema.  Skin: Erythematous, macular rash scattered over bilateral forearms. Palms without erythema.   Lab Results:  Lab Results  Component Value Date   WBC 6.6 08/31/2013   HGB 11.2* 08/31/2013   HCT 34.2* 08/31/2013   MCV 83.9 08/31/2013   PLT 298 08/31/2013   NEUTROABS 4.0 08/31/2013    Imaging:  No results found.  Medications: I have reviewed the patient's current medications.  Assessment/Plan: 1. Stage IIIB (T3 N2a) poorly different it adenocarcinoma of the right colon, status post a right colectomy and end ileostomy 07/09/2013. 4 of 15 lymph nodes positive for metastatic carcinoma and 2 tumor deposits. Cycle 1 adjuvant Xeloda 08/15/2013. 2. History of multiple nonmelanoma skin cancers. 3. History of hypertension. 4. Indeterminate 4 mm right lung base nodule on the CT abdomen 07/06/2013.   Chest CT 08/05/2013 with middle and right lower lobe nodules measuring up to 4 mm. Repeat chest CT planned at a 6-9 month interval. 5. Pre-existing rash  over forearms. Exacerbated by Xeloda.   Disposition: [name] appears stable. She has completed one cycle of adjuvant Xeloda. Plan to proceed with cycle 2 as scheduled beginning 09/05/2013. She will return for a followup visit on 09/22/2013. She will contact the office in the interim with any problems.  Plan reviewed with Dr. Benay Spice.    Ned Card ANP/GNP-BC   08/31/2013  10:18 AM

## 2013-08-31 NOTE — Telephone Encounter (Signed)
Gave pt appt for lab,ML and MD for July and August 2015

## 2013-09-01 ENCOUNTER — Encounter: Payer: Self-pay | Admitting: *Deleted

## 2013-09-01 NOTE — Progress Notes (Signed)
RECEIVED A FAX FROM [facility] CONCERNING A CONFIRMATION OF PRESCRIPTION SHIPMENT FOR CAPECITABINE ON 08/31/13.

## 2013-09-06 ENCOUNTER — Telehealth (INDEPENDENT_AMBULATORY_CARE_PROVIDER_SITE_OTHER): Payer: Self-pay

## 2013-09-06 NOTE — Telephone Encounter (Signed)
Pt called states she is doing very well and ostomy is working well. She states she has noticed a small bulge at side of ostomy. No redness. No discomfort. Pt states she thinks she may be developing a hernia and wanted to know if this is normal. Pt advised it is not uncommon to develop  hernias around ostomy sites due to the fact this area is weaker than the rest of the abd wall. Pt advised if area becomes hard,painful more swollen or if stoma function changes she will need to come in to have area evaluated. Pt has recall for Sept. Pt to call if she would like this area evaluated prior to Sept appt.

## 2013-09-08 ENCOUNTER — Telehealth: Payer: Self-pay | Admitting: *Deleted

## 2013-09-08 NOTE — Telephone Encounter (Signed)
Spoke with patient by phone to check in.  She stated she has been doing well.  She has had no problems taking the Xeloda.  She has been tolerating her diet without difficulty and has not had problems with ileostomy output.  She denies complaints and her questions were answered.  Will continue to follow as needed.

## 2013-09-22 ENCOUNTER — Ambulatory Visit (HOSPITAL_BASED_OUTPATIENT_CLINIC_OR_DEPARTMENT_OTHER): Payer: Medicare Other | Admitting: Nurse Practitioner

## 2013-09-22 ENCOUNTER — Telehealth: Payer: Self-pay | Admitting: Nurse Practitioner

## 2013-09-22 ENCOUNTER — Other Ambulatory Visit (HOSPITAL_BASED_OUTPATIENT_CLINIC_OR_DEPARTMENT_OTHER): Payer: Medicare Other

## 2013-09-22 VITALS — BP 148/73 | HR 88 | Temp 98.7°F | Resp 19 | Ht 64.0 in | Wt 142.3 lb

## 2013-09-22 DIAGNOSIS — Z85828 Personal history of other malignant neoplasm of skin: Secondary | ICD-10-CM

## 2013-09-22 DIAGNOSIS — C189 Malignant neoplasm of colon, unspecified: Secondary | ICD-10-CM

## 2013-09-22 DIAGNOSIS — R21 Rash and other nonspecific skin eruption: Secondary | ICD-10-CM

## 2013-09-22 DIAGNOSIS — L270 Generalized skin eruption due to drugs and medicaments taken internally: Secondary | ICD-10-CM

## 2013-09-22 DIAGNOSIS — R911 Solitary pulmonary nodule: Secondary | ICD-10-CM

## 2013-09-22 DIAGNOSIS — C182 Malignant neoplasm of ascending colon: Secondary | ICD-10-CM

## 2013-09-22 DIAGNOSIS — C779 Secondary and unspecified malignant neoplasm of lymph node, unspecified: Secondary | ICD-10-CM

## 2013-09-22 LAB — CBC WITH DIFFERENTIAL/PLATELET
BASO%: 0.1 % (ref 0.0–2.0)
Basophils Absolute: 0 10*3/uL (ref 0.0–0.1)
EOS%: 5.4 % (ref 0.0–7.0)
Eosinophils Absolute: 0.5 10*3/uL (ref 0.0–0.5)
HCT: 34.1 % — ABNORMAL LOW (ref 34.8–46.6)
HGB: 11.5 g/dL — ABNORMAL LOW (ref 11.6–15.9)
LYMPH%: 25.6 % (ref 14.0–49.7)
MCH: 29.3 pg (ref 25.1–34.0)
MCHC: 33.7 g/dL (ref 31.5–36.0)
MCV: 87 fL (ref 79.5–101.0)
MONO#: 0.8 10*3/uL (ref 0.1–0.9)
MONO%: 8.8 % (ref 0.0–14.0)
NEUT#: 5.5 10*3/uL (ref 1.5–6.5)
NEUT%: 60.1 % (ref 38.4–76.8)
Platelets: 257 10*3/uL (ref 145–400)
RBC: 3.92 10*6/uL (ref 3.70–5.45)
RDW: 19.8 % — ABNORMAL HIGH (ref 11.2–14.5)
WBC: 9.1 10*3/uL (ref 3.9–10.3)
lymph#: 2.3 10*3/uL (ref 0.9–3.3)

## 2013-09-22 LAB — COMPREHENSIVE METABOLIC PANEL (CC13)
ALK PHOS: 79 U/L (ref 40–150)
ALT: 13 U/L (ref 0–55)
AST: 17 U/L (ref 5–34)
Albumin: 3.9 g/dL (ref 3.5–5.0)
Anion Gap: 9 mEq/L (ref 3–11)
BUN: 17 mg/dL (ref 7.0–26.0)
CALCIUM: 10.2 mg/dL (ref 8.4–10.4)
CHLORIDE: 103 meq/L (ref 98–109)
CO2: 26 mEq/L (ref 22–29)
CREATININE: 0.9 mg/dL (ref 0.6–1.1)
Glucose: 133 mg/dl (ref 70–140)
Potassium: 3.9 mEq/L (ref 3.5–5.1)
Sodium: 138 mEq/L (ref 136–145)
Total Bilirubin: 0.56 mg/dL (ref 0.20–1.20)
Total Protein: 7.3 g/dL (ref 6.4–8.3)

## 2013-09-22 MED ORDER — PROCHLORPERAZINE MALEATE 5 MG PO TABS: 5.0000 mg | ORAL_TABLET | Freq: Four times a day (QID) | ORAL | Status: DC | PRN

## 2013-09-22 NOTE — Telephone Encounter (Signed)
Pt confirmed labs/ov per 07/16 POF, gave pt AVS......KJ °

## 2013-09-22 NOTE — Progress Notes (Signed)
  [facility] OFFICE PROGRESS NOTE   Diagnosis:  Colon cancer  INTERVAL HISTORY:   Ms. Tippens returns as scheduled. She completed cycle 2 adjuvant Xeloda beginning 09/05/2013. She has mild intermittent nausea. No vomiting. No mouth sores. She does note an alteration in taste. No diarrhea. Hands and feet are "a little red". She notes it is painful to walk. Fingertips have an abnormal sensation. She notes a progressive rash over the forearms, upper chest and upper back/neck.  Objective:  Vital signs in last 24 hours:  Blood pressure 148/73, pulse 88, temperature 98.7 F (37.1 C), temperature source Oral, resp. rate 19, height 5\' 4"  (1.626 m), weight 142 lb 4.8 oz (64.547 kg).    HEENT: No thrush or ulcerations. Resp: Lungs clear. Cardio: Regular cardiac rhythm. GI: Abdomen soft and nontender. No hepatomegaly. Healed midline incision. Right mid abdomen ileostomy. Thick stool in the collection bag. Vascular: No leg edema.  Skin: Diffuse erythematous nodular rash over the forearms and upper chest. Palms and soles with erythema and skin thickening.   Lab Results:  Lab Results  Component Value Date   WBC 9.1 09/22/2013   HGB 11.5* 09/22/2013   HCT 34.1* 09/22/2013   MCV 87.0 09/22/2013   PLT 257 09/22/2013   NEUTROABS 5.5 09/22/2013    Imaging:  No results found.  Medications: I have reviewed the patient's current medications.  Assessment/Plan: 1. Stage IIIB (T3 N2a) poorly different it adenocarcinoma of the right colon, status post a right colectomy and end ileostomy 07/09/2013. 4 of 15 lymph nodes positive for metastatic carcinoma and 2 tumor deposits.  Cycle 1 adjuvant Xeloda 08/15/2013. Cycle 2 adjuvant Xeloda 09/05/2013. 2. History of multiple nonmelanoma skin cancers. 3. History of hypertension. 4. Indeterminate 4 mm right lung base nodule on the CT abdomen 07/06/2013.  Chest CT 08/05/2013 with middle and right lower lobe nodules measuring up to 4 mm. Repeat  chest CT planned at a 6-9 month interval. 5. Pre-existing rash over forearms. Exacerbated by Xeloda.   Disposition: Ms. Pellicane has completed 2 cycles of adjuvant Xeloda. She has progressive skin toxicity (rash over the forearms and upper chest/upper back ) and has developed hand-foot syndrome. We are placing the Xeloda on hold. She will return for a followup visit on 10/03/2013 for reevaluation at which time we will decide the start date for cycle 3. The Xeloda will be dose reduced with cycle 3.  For the nausea a prescription was sent to her pharmacy for Compazine 5 mg every 6 hours as needed.  Patient seen with Dr. Benay Spice.    Ned Card ANP/GNP-BC   09/22/2013  1:35 PM

## 2013-09-23 ENCOUNTER — Encounter: Payer: Self-pay | Admitting: *Deleted

## 2013-09-23 NOTE — Progress Notes (Signed)
RECEIVED A FAX FROM [facility] CONCERNING A CONFIRMATION OF PRESCRIPTION SHIPMENT FOR CAPECITABINE ON 09/22/13.

## 2013-10-03 ENCOUNTER — Encounter: Payer: Self-pay | Admitting: Oncology

## 2013-10-03 ENCOUNTER — Other Ambulatory Visit (HOSPITAL_BASED_OUTPATIENT_CLINIC_OR_DEPARTMENT_OTHER): Payer: Medicare Other

## 2013-10-03 ENCOUNTER — Ambulatory Visit (HOSPITAL_BASED_OUTPATIENT_CLINIC_OR_DEPARTMENT_OTHER): Payer: Medicare Other | Admitting: Oncology

## 2013-10-03 ENCOUNTER — Telehealth: Payer: Self-pay | Admitting: Oncology

## 2013-10-03 VITALS — BP 130/67 | HR 92 | Temp 97.9°F | Resp 18 | Ht 64.0 in | Wt 142.0 lb

## 2013-10-03 DIAGNOSIS — C779 Secondary and unspecified malignant neoplasm of lymph node, unspecified: Secondary | ICD-10-CM

## 2013-10-03 DIAGNOSIS — R911 Solitary pulmonary nodule: Secondary | ICD-10-CM

## 2013-10-03 DIAGNOSIS — C182 Malignant neoplasm of ascending colon: Secondary | ICD-10-CM

## 2013-10-03 DIAGNOSIS — R21 Rash and other nonspecific skin eruption: Secondary | ICD-10-CM

## 2013-10-03 DIAGNOSIS — L270 Generalized skin eruption due to drugs and medicaments taken internally: Secondary | ICD-10-CM

## 2013-10-03 DIAGNOSIS — I10 Essential (primary) hypertension: Secondary | ICD-10-CM

## 2013-10-03 DIAGNOSIS — C183 Malignant neoplasm of hepatic flexure: Secondary | ICD-10-CM

## 2013-10-03 LAB — CBC WITH DIFFERENTIAL/PLATELET
BASO%: 1 % (ref 0.0–2.0)
BASOS ABS: 0.1 10*3/uL (ref 0.0–0.1)
EOS ABS: 0.6 10*3/uL — AB (ref 0.0–0.5)
EOS%: 10 % — AB (ref 0.0–7.0)
HEMATOCRIT: 35.4 % (ref 34.8–46.6)
HGB: 11.5 g/dL — ABNORMAL LOW (ref 11.6–15.9)
LYMPH%: 26.9 % (ref 14.0–49.7)
MCH: 30.2 pg (ref 25.1–34.0)
MCHC: 32.6 g/dL (ref 31.5–36.0)
MCV: 92.6 fL (ref 79.5–101.0)
MONO#: 0.5 10*3/uL (ref 0.1–0.9)
MONO%: 8.7 % (ref 0.0–14.0)
NEUT%: 53.4 % (ref 38.4–76.8)
NEUTROS ABS: 3.1 10*3/uL (ref 1.5–6.5)
PLATELETS: 191 10*3/uL (ref 145–400)
RBC: 3.82 10*6/uL (ref 3.70–5.45)
RDW: 23.1 % — AB (ref 11.2–14.5)
WBC: 5.8 10*3/uL (ref 3.9–10.3)
lymph#: 1.6 10*3/uL (ref 0.9–3.3)

## 2013-10-03 MED ORDER — CAPECITABINE 500 MG PO TABS: ORAL_TABLET | ORAL | Status: DC

## 2013-10-03 NOTE — Patient Instructions (Signed)
Reduce your chemo dose to Xeloda 1000 mg in am and 500 mg in pm X 14 days-start tomorrow

## 2013-10-03 NOTE — Progress Notes (Signed)
Called and left the patient a message to call me back. I was returning her call.

## 2013-10-03 NOTE — Progress Notes (Signed)
  [facility] OFFICE PROGRESS NOTE   Diagnosis: Colon cancer  INTERVAL HISTORY:   [name] returns as scheduled. She reports improvement in the erythematous rash over the arms and trunk. The rash is pruritic. Mild erythema with superficial desquamation over the hands. No pain. No mouth sores or diarrhea  Objective:  Vital signs in last 24 hours:  Blood pressure 130/67, pulse 92, temperature 97.9 F (36.6 C), temperature source Oral, resp. rate 18, height 5\' 4"  (1.626 m), weight 142 lb (64.411 kg), SpO2 98.00%.    HEENT: No thrush or ulcers Resp: Lungs clear bilaterally Cardio: Regular rate and rhythm GI: No hepatosplenomegaly, nontender, right lower quadrant colostomy with formed stool Vascular: No leg edema  Skin: Erythematous dry maculopapular rash over the chest and arms. Mild erythema with superficial desquamation over the palms, no erythema or skin breakdown at the feet     Lab Results:  Lab Results  Component Value Date   WBC 5.8 10/03/2013   HGB 11.5* 10/03/2013   HCT 35.4 10/03/2013   MCV 92.6 10/03/2013   PLT 191 10/03/2013   NEUTROABS 3.1 10/03/2013     Medications: I have reviewed the patient's current medications.  Assessment/Plan: 1. Stage IIIB (T3 N2a) poorly different it adenocarcinoma of the right colon, status post a right colectomy and end ileostomy 07/09/2013. 4 of 15 lymph nodes positive for metastatic carcinoma and 2 tumor deposits.  Cycle 1 adjuvant Xeloda 08/15/2013.  Cycle 2 adjuvant Xeloda 09/05/2013. Cycle 3 adjuvant Xeloda with a dose reduction 10/03/2013 2. History of multiple nonmelanoma skin cancers. 3. History of hypertension. 4. Indeterminate 4 mm right lung base nodule on the CT abdomen 07/06/2013.  Chest CT 08/05/2013 with middle and right lower lobe nodules measuring up to 4 mm. Repeat chest CT planned at a 6-9 month interval. 5. Rash secondary to Xeloda-improved 6. Early hand/foot syndrome secondary to  Xeloda-improved   Disposition:  The skin rash has improved. The hand/foot syndrome also appears improved. The plan is to resume Xeloda with a dose reduction. She knows to discontinue Xeloda and contact us for progression of the rash. [name] will return for an office visit 10/20/2013. The Xeloda was  dose reduced by 50% today.  Betsy Coder, MD  10/03/2013  12:25 PM

## 2013-10-03 NOTE — Telephone Encounter (Signed)
gv adn printed appt sched adn avs for pt fro Aug..Marland Kitchen

## 2013-10-10 ENCOUNTER — Other Ambulatory Visit: Payer: Self-pay | Admitting: *Deleted

## 2013-10-10 NOTE — Telephone Encounter (Signed)
Received fax from [facility] for next cycle of Xeloda (had old instructions on it). Dose has been decreased by 50%. Called pt, she verbalized correct instructions. Taking 2 tabs QAM 1 tab QPM. Notified [facility] of dose reduction. Pt has enough Xeloda on hand for this cycle. Will fax refill after next office visit.

## 2013-10-13 ENCOUNTER — Ambulatory Visit: Payer: Medicare Other | Admitting: Oncology

## 2013-10-13 ENCOUNTER — Other Ambulatory Visit: Payer: Medicare Other

## 2013-10-17 ENCOUNTER — Other Ambulatory Visit: Payer: Self-pay | Admitting: *Deleted

## 2013-10-17 DIAGNOSIS — C182 Malignant neoplasm of ascending colon: Secondary | ICD-10-CM

## 2013-10-17 NOTE — Telephone Encounter (Signed)
THIS REFILL REQUEST FOR CAPECITABINE WAS GIVEN TO DR.SHERRILL'S NURSE, SUSAN COWARD,RN. 

## 2013-10-18 MED ORDER — CAPECITABINE 500 MG PO TABS: ORAL_TABLET | ORAL | Status: DC

## 2013-10-18 NOTE — Addendum Note (Signed)
Addended by: Wyonia Hough on: 10/18/2013 02:31 PM   Modules accepted: Orders

## 2013-10-20 ENCOUNTER — Telehealth: Payer: Self-pay | Admitting: Nurse Practitioner

## 2013-10-20 ENCOUNTER — Telehealth: Payer: Self-pay | Admitting: *Deleted

## 2013-10-20 ENCOUNTER — Ambulatory Visit (HOSPITAL_BASED_OUTPATIENT_CLINIC_OR_DEPARTMENT_OTHER): Payer: Medicare Other | Admitting: Nurse Practitioner

## 2013-10-20 ENCOUNTER — Other Ambulatory Visit (HOSPITAL_BASED_OUTPATIENT_CLINIC_OR_DEPARTMENT_OTHER): Payer: Medicare Other

## 2013-10-20 VITALS — BP 124/75 | HR 89 | Temp 97.4°F | Resp 19 | Ht 64.0 in | Wt 140.3 lb

## 2013-10-20 DIAGNOSIS — R911 Solitary pulmonary nodule: Secondary | ICD-10-CM | POA: Diagnosis not present

## 2013-10-20 DIAGNOSIS — C779 Secondary and unspecified malignant neoplasm of lymph node, unspecified: Secondary | ICD-10-CM

## 2013-10-20 DIAGNOSIS — I10 Essential (primary) hypertension: Secondary | ICD-10-CM

## 2013-10-20 DIAGNOSIS — C183 Malignant neoplasm of hepatic flexure: Secondary | ICD-10-CM

## 2013-10-20 DIAGNOSIS — C182 Malignant neoplasm of ascending colon: Secondary | ICD-10-CM

## 2013-10-20 DIAGNOSIS — R21 Rash and other nonspecific skin eruption: Secondary | ICD-10-CM

## 2013-10-20 DIAGNOSIS — L270 Generalized skin eruption due to drugs and medicaments taken internally: Secondary | ICD-10-CM | POA: Diagnosis not present

## 2013-10-20 LAB — CBC WITH DIFFERENTIAL/PLATELET
BASO%: 0.5 % (ref 0.0–2.0)
BASOS ABS: 0 10*3/uL (ref 0.0–0.1)
EOS%: 6.8 % (ref 0.0–7.0)
Eosinophils Absolute: 0.6 10*3/uL — ABNORMAL HIGH (ref 0.0–0.5)
HCT: 35.4 % (ref 34.8–46.6)
HGB: 11.9 g/dL (ref 11.6–15.9)
LYMPH%: 21.4 % (ref 14.0–49.7)
MCH: 32 pg (ref 25.1–34.0)
MCHC: 33.7 g/dL (ref 31.5–36.0)
MCV: 95 fL (ref 79.5–101.0)
MONO#: 0.6 10*3/uL (ref 0.1–0.9)
MONO%: 6.8 % (ref 0.0–14.0)
NEUT%: 64.5 % (ref 38.4–76.8)
NEUTROS ABS: 5.2 10*3/uL (ref 1.5–6.5)
PLATELETS: 255 10*3/uL (ref 145–400)
RBC: 3.72 10*6/uL (ref 3.70–5.45)
RDW: 24.7 % — AB (ref 11.2–14.5)
WBC: 8.1 10*3/uL (ref 3.9–10.3)
lymph#: 1.7 10*3/uL (ref 0.9–3.3)

## 2013-10-20 LAB — COMPREHENSIVE METABOLIC PANEL (CC13)
ALBUMIN: 3.9 g/dL (ref 3.5–5.0)
ALT: 12 U/L (ref 0–55)
AST: 19 U/L (ref 5–34)
Alkaline Phosphatase: 68 U/L (ref 40–150)
Anion Gap: 12 mEq/L — ABNORMAL HIGH (ref 3–11)
BILIRUBIN TOTAL: 0.65 mg/dL (ref 0.20–1.20)
BUN: 20.5 mg/dL (ref 7.0–26.0)
CO2: 24 mEq/L (ref 22–29)
Calcium: 10.2 mg/dL (ref 8.4–10.4)
Chloride: 103 mEq/L (ref 98–109)
Creatinine: 1.1 mg/dL (ref 0.6–1.1)
GLUCOSE: 152 mg/dL — AB (ref 70–140)
Potassium: 3.7 mEq/L (ref 3.5–5.1)
Sodium: 139 mEq/L (ref 136–145)
Total Protein: 7.4 g/dL (ref 6.4–8.3)

## 2013-10-20 NOTE — Progress Notes (Signed)
Called nutrition and spoke with Benjamine Mola about calling patient for some tips, patient had some questions regarding what to eat and has lost a couple pounds. Benjamine Mola states she will call the patient this afternoon. Informed patient, she verbalized understanding.

## 2013-10-20 NOTE — Progress Notes (Signed)
  [place] OFFICE PROGRESS NOTE   Diagnosis:  Colon cancer.  INTERVAL HISTORY:   Ms. Czerwinski returns as scheduled. She completed cycle 3 adjuvant Xeloda beginning 10/04/2013. The skin rash is stable. The rash is pruritic. She denies nausea/vomiting. No mouth sores. No diarrhea. Palms and soles are "better". No hand or foot pain.  Objective:  Vital signs in last 24 hours:  Blood pressure 124/75, pulse 89, temperature 97.4 F (36.3 C), temperature source Oral, resp. rate 19, height 5\' 4"  (1.626 m), weight 140 lb 4.8 oz (63.64 kg).    HEENT: No thrush or ulcers. Resp: Lungs clear bilaterally. Cardio: Regular rate and rhythm. GI: No organomegaly. Right lower quadrant ileostomy. Vascular: No leg edema. Skin: Erythematous dry maculopapular rash scattered over the chest and arms. Palms and soles without erythema. Superficial desquamation over the soles.  Lab Results:  Lab Results  Component Value Date   WBC 8.1 10/20/2013   HGB 11.9 10/20/2013   HCT 35.4 10/20/2013   MCV 95.0 10/20/2013   PLT 255 10/20/2013   NEUTROABS 5.2 10/20/2013    Imaging:  No results found.  Medications: I have reviewed the patient's current medications.  Assessment/Plan: 1. Stage IIIB (T3 N2a) poorly different it adenocarcinoma of the right colon, status post a right colectomy and end ileostomy 07/09/2013. 4 of 15 lymph nodes positive for metastatic carcinoma and 2 tumor deposits.  Cycle 1 adjuvant Xeloda 08/15/2013.  Cycle 2 adjuvant Xeloda 09/05/2013.  Cycle 3 adjuvant Xeloda with a dose reduction 10/03/2013. 2. History of multiple nonmelanoma skin cancers. 3. History of hypertension. 4. Indeterminate 4 mm right lung base nodule on the CT abdomen 07/06/2013.  Chest CT 08/05/2013 with middle and right lower lobe nodules measuring up to 4 mm. Repeat chest CT planned at a 6-9 month interval. 5. Rash secondary to Xeloda. Stable. 6. Early hand/foot syndrome secondary to  Xeloda-improved.   Disposition: Ms. Gutzmer appears stable. She has completed 3 cycles of adjuvant Xeloda. The dose was reduced with cycle 3 due to a skin rash and hand-foot syndrome. The rash appears unchanged. Plan to proceed with cycle 4 Xeloda beginning 10/25/2013. She will return for a followup visit on 11/08/2013. She will contact the office in the interim with any problems. We discussed strategies to help with the pruritus at today's visit including use of a moisturizer, oatmeal baths, over-the-counter hydrocortisone cream. She will contact the office if these measures are not effective.    Ned Card ANP/GNP-BC   10/20/2013  10:57 AM

## 2013-10-20 NOTE — Telephone Encounter (Signed)
Received a call from Dr. Gearldine Shown office asking RD to please call pt, due to questions about diet.  Spoke with pt over the phone.  Pt reports good appetite and po intake, no issues with bowels, but does c/o changes in taste.  Discussed meals to prepare and snacks to consume.  Listed ways to cope with changes in taste.  Mailed handouts on dealing with changes in tastes and RD contact information.

## 2013-10-20 NOTE — Telephone Encounter (Signed)
, °

## 2013-10-25 ENCOUNTER — Other Ambulatory Visit: Payer: Self-pay | Admitting: *Deleted

## 2013-10-25 DIAGNOSIS — C182 Malignant neoplasm of ascending colon: Secondary | ICD-10-CM

## 2013-10-25 MED ORDER — CAPECITABINE 500 MG PO TABS: ORAL_TABLET | ORAL | Status: DC

## 2013-11-08 ENCOUNTER — Other Ambulatory Visit (HOSPITAL_BASED_OUTPATIENT_CLINIC_OR_DEPARTMENT_OTHER): Payer: Medicare Other

## 2013-11-08 ENCOUNTER — Telehealth: Payer: Self-pay | Admitting: Nurse Practitioner

## 2013-11-08 ENCOUNTER — Ambulatory Visit (HOSPITAL_BASED_OUTPATIENT_CLINIC_OR_DEPARTMENT_OTHER): Payer: Medicare Other | Admitting: Nurse Practitioner

## 2013-11-08 ENCOUNTER — Other Ambulatory Visit: Payer: Self-pay | Admitting: *Deleted

## 2013-11-08 VITALS — BP 138/64 | HR 86 | Temp 97.0°F | Resp 18 | Ht 64.0 in | Wt 141.9 lb

## 2013-11-08 DIAGNOSIS — R911 Solitary pulmonary nodule: Secondary | ICD-10-CM

## 2013-11-08 DIAGNOSIS — C182 Malignant neoplasm of ascending colon: Secondary | ICD-10-CM

## 2013-11-08 DIAGNOSIS — C183 Malignant neoplasm of hepatic flexure: Secondary | ICD-10-CM

## 2013-11-08 DIAGNOSIS — L270 Generalized skin eruption due to drugs and medicaments taken internally: Secondary | ICD-10-CM

## 2013-11-08 LAB — COMPREHENSIVE METABOLIC PANEL (CC13)
ALK PHOS: 65 U/L (ref 40–150)
ALT: 16 U/L (ref 0–55)
AST: 21 U/L (ref 5–34)
Albumin: 3.8 g/dL (ref 3.5–5.0)
Anion Gap: 9 mEq/L (ref 3–11)
BILIRUBIN TOTAL: 0.57 mg/dL (ref 0.20–1.20)
BUN: 15.9 mg/dL (ref 7.0–26.0)
CO2: 24 mEq/L (ref 22–29)
Calcium: 9.9 mg/dL (ref 8.4–10.4)
Chloride: 109 mEq/L (ref 98–109)
Creatinine: 1 mg/dL (ref 0.6–1.1)
GLUCOSE: 127 mg/dL (ref 70–140)
Potassium: 4.3 mEq/L (ref 3.5–5.1)
Sodium: 142 mEq/L (ref 136–145)
Total Protein: 7.1 g/dL (ref 6.4–8.3)

## 2013-11-08 LAB — CBC WITH DIFFERENTIAL/PLATELET
BASO%: 0.6 % (ref 0.0–2.0)
Basophils Absolute: 0 10*3/uL (ref 0.0–0.1)
EOS%: 5.5 % (ref 0.0–7.0)
Eosinophils Absolute: 0.4 10*3/uL (ref 0.0–0.5)
HEMATOCRIT: 35 % (ref 34.8–46.6)
HGB: 11.5 g/dL — ABNORMAL LOW (ref 11.6–15.9)
LYMPH%: 22.8 % (ref 14.0–49.7)
MCH: 33.1 pg (ref 25.1–34.0)
MCHC: 33 g/dL (ref 31.5–36.0)
MCV: 100.4 fL (ref 79.5–101.0)
MONO#: 0.4 10*3/uL (ref 0.1–0.9)
MONO%: 6.5 % (ref 0.0–14.0)
NEUT%: 64.6 % (ref 38.4–76.8)
NEUTROS ABS: 4.4 10*3/uL (ref 1.5–6.5)
PLATELETS: 227 10*3/uL (ref 145–400)
RBC: 3.49 10*6/uL — ABNORMAL LOW (ref 3.70–5.45)
RDW: 24.3 % — ABNORMAL HIGH (ref 11.2–14.5)
WBC: 6.8 10*3/uL (ref 3.9–10.3)
lymph#: 1.5 10*3/uL (ref 0.9–3.3)

## 2013-11-08 MED ORDER — CAPECITABINE 500 MG PO TABS: ORAL_TABLET | ORAL | Status: DC

## 2013-11-08 NOTE — Telephone Encounter (Signed)
Faxed Xeloda prescription to [facility].

## 2013-11-08 NOTE — Progress Notes (Signed)
  [place] OFFICE PROGRESS NOTE   Diagnosis:  Colon cancer  INTERVAL HISTORY:   She returns as scheduled. She completed cycle 4 adjuvant Xeloda beginning 10/25/2013. She denies nausea/vomiting. No mouth sores. No diarrhea. No hand or foot pain or redness. She notes that the rash over the forearms is less red. The rash continues to be pruritic.  Objective:  Vital signs in last 24 hours:  Blood pressure 138/64, pulse 86, temperature 97 F (36.1 C), temperature source Oral, resp. rate 18, height 5\' 4"  (1.626 m), weight 141 lb 14.4 oz (64.365 kg), SpO2 100.00%.    HEENT: No thrush or ulcers. Resp: Lungs clear bilaterally. Cardio: Regular rate and rhythm. GI: Abdomen soft and nontender. No hepatomegaly. Right lower quadrant ileostomy. Vascular: No leg edema.  Skin: Erythematous maculopapular rash scattered over the chest and arms. Palms and soles with mild erythema. No skin breakdown.    Lab Results:  Lab Results  Component Value Date   WBC 6.8 11/08/2013   HGB 11.5* 11/08/2013   HCT 35.0 11/08/2013   MCV 100.4 11/08/2013   PLT 227 11/08/2013   NEUTROABS 4.4 11/08/2013    Imaging:  No results found.  Medications: I have reviewed the patient's current medications.  Assessment/Plan: 1. Stage IIIB (T3 N2a) poorly different it adenocarcinoma of the right colon, status post a right colectomy and end ileostomy 07/09/2013. 4 of 15 lymph nodes positive for metastatic carcinoma and 2 tumor deposits.  Cycle 1 adjuvant Xeloda 08/15/2013.  Cycle 2 adjuvant Xeloda 09/05/2013.  Cycle 3 adjuvant Xeloda with a dose reduction 10/03/2013. Cycle 4 adjuvant Xeloda 10/25/2013. 2. History of multiple nonmelanoma skin cancers. 3. History of hypertension. 4. Indeterminate 4 mm right lung base nodule on the CT abdomen 07/06/2013.  Chest CT 08/05/2013 with middle and right lower lobe nodules measuring up to 4 mm. Repeat chest CT planned at a 6-9 month interval. 5. Rash secondary to  Xeloda. Stable. 6. Early hand/foot syndrome secondary to Xeloda.   Disposition: She appears stable. She has completed 4 cycles of adjuvant Xeloda. Plan to proceed with cycle 5 at the current dose beginning 11/15/2013. She will return for a followup visit in approximately 3 weeks. She will contact the office in the interim with any problems.  Plan reviewed with Dr. Benay Spice.    Ned Card ANP/GNP-BC   11/08/2013  2:31 PM

## 2013-11-08 NOTE — Telephone Encounter (Signed)
, °

## 2013-11-17 ENCOUNTER — Telehealth: Payer: Self-pay | Admitting: *Deleted

## 2013-11-17 NOTE — Telephone Encounter (Signed)
Call from pt to clarify Xeloda instructions. Taking after meals but evening dose ends up being about 10 hours apart from morning dose. Is this OK? Yes, continue to take :30 minutes after a meal, does not have to be 12 hours apart. Pt voiced understanding.

## 2013-11-21 ENCOUNTER — Telehealth: Payer: Self-pay | Admitting: *Deleted

## 2013-11-21 NOTE — Telephone Encounter (Signed)
Pt called wanting to know "I'm going to my PCP for yearly physical; have to be fasting for blood work prior to appt @ 9:15; is it OK to delay taking my Xeloda until after I get back home?"  Assured pt that it would be OK to take Xeloda after PCP appt and then take evening dose later as well; will confirm with Dr. Benay Spice and return call.  Pt verbalized understanding.

## 2013-11-21 NOTE — Telephone Encounter (Signed)
Notified pt that MD confirmed OK to take Xeloda after she gets back home from PCP appt and take evening dose later than usual d/t delay.  Pt verbalized understanding and expressed appreciation for call back.

## 2013-11-23 ENCOUNTER — Telehealth: Payer: Self-pay | Admitting: *Deleted

## 2013-11-23 NOTE — Telephone Encounter (Signed)
Pt called asking "going to Dr. Harrington Challenger Friday; is it OK to get flu shot while I'm on Xeloda?"  Per Dr. Benay Spice; notified pt that Okay to get flu shot while on Xeloda.  Pt verbalized understanding and expressed appreciation for call back.

## 2013-11-24 ENCOUNTER — Telehealth: Payer: Self-pay | Admitting: *Deleted

## 2013-11-24 NOTE — Telephone Encounter (Signed)
Received message from pt to call re: medication question.  Pt asking "went to my physical this am and took my Xeloda at 11:30 today because I had to have fasting lab work done; what time should I take my evening dose?"  Per Dr. Benay Spice notified pt that she could take her evening dose between 8:30-9:00 this evening.  Pt verbalized understanding and states "I just want to do it correctly"  Emotional support given and confirmed appt 11/28/13

## 2013-11-28 ENCOUNTER — Other Ambulatory Visit (HOSPITAL_BASED_OUTPATIENT_CLINIC_OR_DEPARTMENT_OTHER): Payer: Medicare Other

## 2013-11-28 ENCOUNTER — Ambulatory Visit (HOSPITAL_BASED_OUTPATIENT_CLINIC_OR_DEPARTMENT_OTHER): Payer: Medicare Other | Admitting: Nurse Practitioner

## 2013-11-28 ENCOUNTER — Other Ambulatory Visit: Payer: Self-pay

## 2013-11-28 VITALS — BP 142/71 | HR 82 | Temp 98.2°F | Resp 20 | Ht 64.0 in | Wt 142.1 lb

## 2013-11-28 DIAGNOSIS — C779 Secondary and unspecified malignant neoplasm of lymph node, unspecified: Secondary | ICD-10-CM

## 2013-11-28 DIAGNOSIS — L270 Generalized skin eruption due to drugs and medicaments taken internally: Secondary | ICD-10-CM

## 2013-11-28 DIAGNOSIS — C183 Malignant neoplasm of hepatic flexure: Secondary | ICD-10-CM

## 2013-11-28 DIAGNOSIS — R911 Solitary pulmonary nodule: Secondary | ICD-10-CM

## 2013-11-28 DIAGNOSIS — C182 Malignant neoplasm of ascending colon: Secondary | ICD-10-CM

## 2013-11-28 LAB — COMPREHENSIVE METABOLIC PANEL (CC13)
ALT: 16 U/L (ref 0–55)
AST: 23 U/L (ref 5–34)
Albumin: 3.8 g/dL (ref 3.5–5.0)
Alkaline Phosphatase: 64 U/L (ref 40–150)
Anion Gap: 10 mEq/L (ref 3–11)
BUN: 13.7 mg/dL (ref 7.0–26.0)
CALCIUM: 9.7 mg/dL (ref 8.4–10.4)
CHLORIDE: 108 meq/L (ref 98–109)
CO2: 24 meq/L (ref 22–29)
CREATININE: 0.9 mg/dL (ref 0.6–1.1)
Glucose: 129 mg/dl (ref 70–140)
Potassium: 4.5 mEq/L (ref 3.5–5.1)
Sodium: 142 mEq/L (ref 136–145)
Total Bilirubin: 0.55 mg/dL (ref 0.20–1.20)
Total Protein: 7.2 g/dL (ref 6.4–8.3)

## 2013-11-28 LAB — CBC WITH DIFFERENTIAL/PLATELET
BASO%: 0.7 % (ref 0.0–2.0)
BASOS ABS: 0.1 10*3/uL (ref 0.0–0.1)
EOS ABS: 0.4 10*3/uL (ref 0.0–0.5)
EOS%: 5.7 % (ref 0.0–7.0)
HEMATOCRIT: 35.8 % (ref 34.8–46.6)
HEMOGLOBIN: 12 g/dL (ref 11.6–15.9)
LYMPH#: 1.8 10*3/uL (ref 0.9–3.3)
LYMPH%: 25.8 % (ref 14.0–49.7)
MCH: 34.3 pg — ABNORMAL HIGH (ref 25.1–34.0)
MCHC: 33.4 g/dL (ref 31.5–36.0)
MCV: 102.7 fL — ABNORMAL HIGH (ref 79.5–101.0)
MONO#: 0.4 10*3/uL (ref 0.1–0.9)
MONO%: 5.8 % (ref 0.0–14.0)
NEUT#: 4.4 10*3/uL (ref 1.5–6.5)
NEUT%: 62 % (ref 38.4–76.8)
Platelets: 246 10*3/uL (ref 145–400)
RBC: 3.49 10*6/uL — ABNORMAL LOW (ref 3.70–5.45)
RDW: 20.4 % — ABNORMAL HIGH (ref 11.2–14.5)
WBC: 7.1 10*3/uL (ref 3.9–10.3)

## 2013-11-28 MED ORDER — CAPECITABINE 500 MG PO TABS: ORAL_TABLET | ORAL | Status: DC

## 2013-11-28 NOTE — Progress Notes (Signed)
  [place] OFFICE PROGRESS NOTE   Diagnosis:  Colon cancer  INTERVAL HISTORY:   She returns as scheduled. She began cycle 5 adjuvant Xeloda on 11/16/2013. She denies nausea/vomiting. No mouth sores. No diarrhea. No hand or foot pain. She notes mild redness of the palms and soles. No skin breakdown. She feels the skin rash is better. The rash continues to be pruritic.  Objective:  Vital signs in last 24 hours:  Blood pressure 142/71, pulse 82, temperature 98.2 F (36.8 C), temperature source Oral, resp. rate 20, height 5\' 4"  (1.626 m), weight 142 lb 1.6 oz (64.456 kg).    HEENT: No thrush or ulcers. Resp: Lungs clear bilaterally. Cardio: Regular rate and rhythm. GI: Abdomen soft and nontender. No hepatomegaly. Right lower quadrant ileostomy. Vascular: No leg edema. Skin: Persistent erythematous maculopapular rash scattered over the forearms and chest. Palms and soles with mild erythema. No skin breakdown.     Lab Results:  Lab Results  Component Value Date   WBC 7.1 11/28/2013   HGB 12.0 11/28/2013   HCT 35.8 11/28/2013   MCV 102.7* 11/28/2013   PLT 246 11/28/2013   NEUTROABS 4.4 11/28/2013    Imaging:  No results found.  Medications: I have reviewed the patient's current medications.  Assessment/Plan: 1. Stage IIIB (T3 N2a) poorly different it adenocarcinoma of the right colon, status post a right colectomy and end ileostomy 07/09/2013. 4 of 15 lymph nodes positive for metastatic carcinoma and 2 tumor deposits.  Cycle 1 adjuvant Xeloda 08/15/2013.  Cycle 2 adjuvant Xeloda 09/05/2013.  Cycle 3 adjuvant Xeloda with a dose reduction 10/03/2013.  Cycle 4 adjuvant Xeloda 10/25/2013. Cycle 5 adjuvant Xeloda 11/16/2013. 2. History of multiple nonmelanoma skin cancers. 3. History of hypertension. 4. Indeterminate 4 mm right lung base nodule on the CT abdomen 07/06/2013.  Chest CT 08/05/2013 with middle and right lower lobe nodules measuring up to 4 mm. Repeat  chest CT planned at a 6-9 month interval. 5. Rash secondary to Xeloda. Stable. 6. Early hand/foot syndrome secondary to Xeloda. Stable.   Disposition: [name] appears stable. She is completing cycle 5 adjuvant Xeloda. She will begin cycle 6 on 12/07/2013. She will return for a followup visit on 12/20/2013. She will contact the office in the interim with any problems.    Ned Card ANP/GNP-BC   11/28/2013  2:47 PM

## 2013-11-29 ENCOUNTER — Other Ambulatory Visit: Payer: Self-pay

## 2013-11-29 ENCOUNTER — Other Ambulatory Visit: Payer: Self-pay | Admitting: *Deleted

## 2013-11-29 DIAGNOSIS — C182 Malignant neoplasm of ascending colon: Secondary | ICD-10-CM

## 2013-11-29 MED ORDER — CAPECITABINE 500 MG PO TABS: ORAL_TABLET | ORAL | Status: DC

## 2013-12-07 ENCOUNTER — Encounter: Payer: Self-pay | Admitting: *Deleted

## 2013-12-07 NOTE — Progress Notes (Signed)
[place] Social Work  Clinical Social Work was referred by patient to review and complete healthcare advance directives.  Clinical Social Worker met with patient in [place] office.  The patient designated Carmelia Roller, daughter, as their primary healthcare agent and Shaylin Blatt, son, as their secondary agent.  Patient also completed healthcare living will.    Clinical Social Worker notarized documents and made copies for patient/family. Clinical Social Worker will send documents to medical records to be scanned into patient's chart. Clinical Social Worker encouraged patient/family to contact with any additional questions or concerns.  Polo Riley, MSW, [place] Worker [facility] [phone]

## 2013-12-20 ENCOUNTER — Telehealth: Payer: Self-pay | Admitting: Oncology

## 2013-12-20 ENCOUNTER — Other Ambulatory Visit: Payer: Self-pay | Admitting: Oncology

## 2013-12-20 ENCOUNTER — Other Ambulatory Visit: Payer: Self-pay | Admitting: *Deleted

## 2013-12-20 ENCOUNTER — Other Ambulatory Visit (HOSPITAL_BASED_OUTPATIENT_CLINIC_OR_DEPARTMENT_OTHER): Payer: Medicare Other

## 2013-12-20 ENCOUNTER — Ambulatory Visit (HOSPITAL_BASED_OUTPATIENT_CLINIC_OR_DEPARTMENT_OTHER): Payer: Medicare Other | Admitting: Oncology

## 2013-12-20 VITALS — BP 156/87 | HR 89 | Temp 98.7°F | Resp 20 | Ht 64.0 in | Wt 142.0 lb

## 2013-12-20 DIAGNOSIS — L271 Localized skin eruption due to drugs and medicaments taken internally: Secondary | ICD-10-CM

## 2013-12-20 DIAGNOSIS — C779 Secondary and unspecified malignant neoplasm of lymph node, unspecified: Secondary | ICD-10-CM

## 2013-12-20 DIAGNOSIS — C183 Malignant neoplasm of hepatic flexure: Secondary | ICD-10-CM

## 2013-12-20 DIAGNOSIS — C182 Malignant neoplasm of ascending colon: Secondary | ICD-10-CM

## 2013-12-20 DIAGNOSIS — R911 Solitary pulmonary nodule: Secondary | ICD-10-CM

## 2013-12-20 DIAGNOSIS — L270 Generalized skin eruption due to drugs and medicaments taken internally: Secondary | ICD-10-CM

## 2013-12-20 DIAGNOSIS — I10 Essential (primary) hypertension: Secondary | ICD-10-CM

## 2013-12-20 LAB — CBC WITH DIFFERENTIAL/PLATELET
BASO%: 0.6 % (ref 0.0–2.0)
BASOS ABS: 0 10*3/uL (ref 0.0–0.1)
EOS%: 5.8 % (ref 0.0–7.0)
Eosinophils Absolute: 0.4 10*3/uL (ref 0.0–0.5)
HEMATOCRIT: 37.1 % (ref 34.8–46.6)
HEMOGLOBIN: 12.5 g/dL (ref 11.6–15.9)
LYMPH%: 22.8 % (ref 14.0–49.7)
MCH: 35.1 pg — AB (ref 25.1–34.0)
MCHC: 33.6 g/dL (ref 31.5–36.0)
MCV: 104.4 fL — AB (ref 79.5–101.0)
MONO#: 0.6 10*3/uL (ref 0.1–0.9)
MONO%: 7.7 % (ref 0.0–14.0)
NEUT#: 4.7 10*3/uL (ref 1.5–6.5)
NEUT%: 63.1 % (ref 38.4–76.8)
PLATELETS: 252 10*3/uL (ref 145–400)
RBC: 3.56 10*6/uL — ABNORMAL LOW (ref 3.70–5.45)
RDW: 17.8 % — ABNORMAL HIGH (ref 11.2–14.5)
WBC: 7.5 10*3/uL (ref 3.9–10.3)
lymph#: 1.7 10*3/uL (ref 0.9–3.3)

## 2013-12-20 MED ORDER — CAPECITABINE 500 MG PO TABS: ORAL_TABLET | ORAL | Status: DC

## 2013-12-20 NOTE — Telephone Encounter (Signed)
, °

## 2013-12-20 NOTE — Progress Notes (Signed)
  [place] OFFICE PROGRESS NOTE   Diagnosis: Colon cancer INTERVAL HISTORY:   [name] returns as scheduled. She completed cycle 6 Xeloda beginning 12/06/2013. No mouth sores, hand/foot pain, or diarrhea. The output from the ileostomy is sometimes formed. She continues to have a pruritic rash over the trunk and extremities. No hand or foot pain.  Objective:  Vital signs in last 24 hours:  Blood pressure 156/87, pulse 89, temperature 98.7 F (37.1 C), resp. rate 20, height 5\' 4"  (1.626 m), weight 142 lb (64.411 kg), SpO2 99.00%.    HEENT: No thrush or ulcers Resp: Lungs clear bilaterally Cardio: Regular rate and rhythm GI: No hepatomegaly, liquid stool in the ileostomy bag, nontender Vascular: No leg edema  Skin: Erythematous maculopapular rash over the trunk and extremities. Mild erythema at the palms and soles without skin breakdown     Lab Results:  Lab Results  Component Value Date   WBC 7.5 12/20/2013   HGB 12.5 12/20/2013   HCT 37.1 12/20/2013   MCV 104.4* 12/20/2013   PLT 252 12/20/2013   NEUTROABS 4.7 12/20/2013   Medications: I have reviewed the patient's current medications.  Assessment/Plan: 1. Stage IIIB (T3 N2a) poorly different it adenocarcinoma of the right colon, status post a right colectomy and end ileostomy 07/09/2013. 4 of 15 lymph nodes positive for metastatic carcinoma and 2 tumor deposits.  Cycle 1 adjuvant Xeloda 08/15/2013.  Cycle 2 adjuvant Xeloda 09/05/2013.  Cycle 3 adjuvant Xeloda with a dose reduction 10/03/2013.  Cycle 4 adjuvant Xeloda 10/25/2013.  Cycle 5 adjuvant Xeloda 11/16/2013. Cycle 6 adjuvant Xeloda 12/06/2013 Cycle 7 of adjuvant Xeloda 12/27/2013 2. History of multiple nonmelanoma skin cancers. 3. History of hypertension. 4. Indeterminate 4 mm right lung base nodule on the CT abdomen 07/06/2013.  Chest CT 08/05/2013 with middle and right lower lobe nodules measuring up to 4 mm. Repeat chest CT planned at a  6-9 month interval. 5. Rash secondary to Xeloda. Stable. 6. Early hand/foot syndrome secondary to Xeloda. Stable.   Disposition:  She appears stable. She will complete cycle 7 of adjuvant Xeloda beginning 12/27/2013. She will return for an office visit 01/11/2014. The plan is complete 8 cycles of Xeloda.  [name] will see Dr. Harlow Asa to discuss taking down the ileostomy at the completion of chemotherapy.  Betsy Coder, MD  12/20/2013  12:52 PM

## 2013-12-29 NOTE — Telephone Encounter (Signed)
RECEIVED A FAX FROM [facility] CONCERNING A CONFIRMATION OF PRESCRIPTION SHIPMENT FOR CAPECITABINE ON 12/27/13

## 2014-01-09 ENCOUNTER — Ambulatory Visit (INDEPENDENT_AMBULATORY_CARE_PROVIDER_SITE_OTHER): Payer: Self-pay | Admitting: Surgery

## 2014-01-10 ENCOUNTER — Other Ambulatory Visit: Payer: Self-pay | Admitting: *Deleted

## 2014-01-10 NOTE — Telephone Encounter (Signed)
THIS REFILL REQUEST FOR CAPECITABINE WAS PLACED ON DR.SHERRILL'S DESK. 

## 2014-01-11 ENCOUNTER — Ambulatory Visit (HOSPITAL_BASED_OUTPATIENT_CLINIC_OR_DEPARTMENT_OTHER): Payer: Medicare Other | Admitting: Nurse Practitioner

## 2014-01-11 ENCOUNTER — Other Ambulatory Visit: Payer: Self-pay

## 2014-01-11 ENCOUNTER — Other Ambulatory Visit (HOSPITAL_BASED_OUTPATIENT_CLINIC_OR_DEPARTMENT_OTHER): Payer: Medicare Other

## 2014-01-11 ENCOUNTER — Telehealth: Payer: Self-pay | Admitting: Nurse Practitioner

## 2014-01-11 VITALS — BP 145/69 | HR 79 | Temp 98.6°F | Resp 18 | Ht 64.0 in | Wt 144.2 lb

## 2014-01-11 DIAGNOSIS — C182 Malignant neoplasm of ascending colon: Secondary | ICD-10-CM

## 2014-01-11 DIAGNOSIS — C183 Malignant neoplasm of hepatic flexure: Secondary | ICD-10-CM

## 2014-01-11 LAB — CBC WITH DIFFERENTIAL/PLATELET
BASO%: 0.5 % (ref 0.0–2.0)
Basophils Absolute: 0 10*3/uL (ref 0.0–0.1)
EOS%: 7.9 % — AB (ref 0.0–7.0)
Eosinophils Absolute: 0.5 10*3/uL (ref 0.0–0.5)
HCT: 35.7 % (ref 34.8–46.6)
HEMOGLOBIN: 11.9 g/dL (ref 11.6–15.9)
LYMPH%: 22.5 % (ref 14.0–49.7)
MCH: 34.9 pg — AB (ref 25.1–34.0)
MCHC: 33.3 g/dL (ref 31.5–36.0)
MCV: 105 fL — AB (ref 79.5–101.0)
MONO#: 0.6 10*3/uL (ref 0.1–0.9)
MONO%: 9.3 % (ref 0.0–14.0)
NEUT#: 3.7 10*3/uL (ref 1.5–6.5)
NEUT%: 59.8 % (ref 38.4–76.8)
PLATELETS: 225 10*3/uL (ref 145–400)
RBC: 3.4 10*6/uL — ABNORMAL LOW (ref 3.70–5.45)
RDW: 17.4 % — AB (ref 11.2–14.5)
WBC: 6.3 10*3/uL (ref 3.9–10.3)
lymph#: 1.4 10*3/uL (ref 0.9–3.3)

## 2014-01-11 LAB — COMPREHENSIVE METABOLIC PANEL (CC13)
ALT: 14 U/L (ref 0–55)
AST: 19 U/L (ref 5–34)
Albumin: 3.6 g/dL (ref 3.5–5.0)
Alkaline Phosphatase: 57 U/L (ref 40–150)
Anion Gap: 7 mEq/L (ref 3–11)
BILIRUBIN TOTAL: 0.55 mg/dL (ref 0.20–1.20)
BUN: 13.7 mg/dL (ref 7.0–26.0)
CO2: 26 mEq/L (ref 22–29)
CREATININE: 0.9 mg/dL (ref 0.6–1.1)
Calcium: 9.7 mg/dL (ref 8.4–10.4)
Chloride: 109 mEq/L (ref 98–109)
Glucose: 116 mg/dl (ref 70–140)
Potassium: 4.2 mEq/L (ref 3.5–5.1)
Sodium: 142 mEq/L (ref 136–145)
Total Protein: 6.8 g/dL (ref 6.4–8.3)

## 2014-01-11 MED ORDER — CAPECITABINE 500 MG PO TABS: ORAL_TABLET | ORAL | Status: DC

## 2014-01-11 NOTE — Telephone Encounter (Signed)
Faxed refill request for Xeloda to [facility]

## 2014-01-11 NOTE — Telephone Encounter (Signed)
Gave avs & cal for Dec. Pt has prep for CT scan.

## 2014-01-11 NOTE — Progress Notes (Signed)
  [name] OFFICE PROGRESS NOTE   Diagnosis:  Colon cancer  INTERVAL HISTORY:   She returns as scheduled. She completed cycle 7 Xeloda beginning 12/27/2013. She denies nausea/vomiting. No mouth sores. No diarrhea. She has mild redness on the palms and soles. No associated pain. Skin rash is stable.  Objective:  Vital signs in last 24 hours:  Blood pressure 145/69, pulse 79, temperature 98.6 F (37 C), temperature source Oral, resp. rate 18, height 5\' 4"  (1.626 m), weight 144 lb 3.2 oz (65.409 kg).    HEENT: no thrush or ulcers. Resp: lungs clear bilaterally. Cardio: regular rate and rhythm. GI: abdomen soft and nontender. No hepatomegaly. Right lower quadrant ileostomy. Vascular: no leg edema. Skin:erythematous maculopapular rash over the trunk and extremities. Palms and soles with mild erythema. No skin breakdown.    Lab Results:  Lab Results  Component Value Date   WBC 6.3 01/11/2014   HGB 11.9 01/11/2014   HCT 35.7 01/11/2014   MCV 105.0* 01/11/2014   PLT 225 01/11/2014   NEUTROABS 3.7 01/11/2014    Imaging:  No results found.  Medications: I have reviewed the patient's current medications.  Assessment/Plan: 1. Stage IIIB (T3 N2a) poorly different it adenocarcinoma of the right colon, status post a right colectomy and end ileostomy 07/09/2013.  4 of 15 lymph nodes positive for metastatic carcinoma and 2 tumor deposits.   Cycle 1 adjuvant Xeloda 08/15/2013.   Cycle 2 adjuvant Xeloda 09/05/2013.   Cycle 3 adjuvant Xeloda with a dose reduction 10/03/2013.   Cycle 4 adjuvant Xeloda 10/25/2013.   Cycle 5 adjuvant Xeloda 11/16/2013.  Cycle 6 adjuvant Xeloda 12/06/2013  Cycle 7 of adjuvant Xeloda 12/27/2013  Cycle 8 of adjuvant Xeloda 01/17/2014. 2. History of multiple nonmelanoma skin cancers. 3. History of hypertension. 4. Indeterminate 4 mm right lung base nodule on the CT abdomen 07/06/2013.  Chest CT 08/05/2013 with middle and  right lower lobe nodules measuring up to 4 mm. Repeat chest CT planned at a 6-9 month interval. 5. Rash secondary to Xeloda. Stable. 6. Early hand/foot syndrome secondary to Xeloda. Stable.   Disposition: She appears stable. She has completed 7 cycles of adjuvant Xeloda. Plan to proceed with the eighth and final cycle as scheduled on 01/17/2014. We referred her for restaging CT scans in the next 4-6 weeks. She will return for a followup visit a few days after the scans to review the results. She will contact the office prior to her next visit with any problems.  Plan reviewed with Dr. Benay Spice.    Ned Card ANP/GNP-BC   01/11/2014  12:22 PM

## 2014-01-12 ENCOUNTER — Telehealth: Payer: Self-pay

## 2014-01-12 LAB — CEA: CEA: 1.2 ng/mL (ref 0.0–5.0)

## 2014-01-12 NOTE — Telephone Encounter (Signed)
Called and informed pt CEA is normal, pt verbalized understanding and denies any further questions or concerns.

## 2014-01-12 NOTE — Telephone Encounter (Signed)
-----   Message from Ladell Pier, MD sent at 01/12/2014  6:45 AM EST ----- Please call patient, cea is normal

## 2014-01-13 NOTE — Telephone Encounter (Signed)
RECEIVED A FAX FROM [facility] CONCERNING A CONFIRMATION OF PRESCRIPTION SHIPMENT FOR CAPECITABINE ON 01/12/14.

## 2014-01-27 ENCOUNTER — Telehealth: Payer: Self-pay | Admitting: Oncology

## 2014-01-27 NOTE — Telephone Encounter (Signed)
appt for 12/10 moved from BS to LT per 11/20 pof for #[id number]. s/w pt she is aware. appts for lb/ct 12/8 remain the same - pt aware.

## 2014-02-06 ENCOUNTER — Telehealth: Payer: Self-pay | Admitting: *Deleted

## 2014-02-06 NOTE — Telephone Encounter (Signed)
Called back and confirmed she is still on Norvasc daily, but Hyzaar had been put on hold-she thinks because of the diarrhea. BP readings have been good recently. Instructed her to call her PCP and ask about resuming the Hyzaar since her treatment is done. Finished her Xeloda 01/30/14.

## 2014-02-06 NOTE — Telephone Encounter (Signed)
Asking if she should resume the BP med that was put on hold when she was on Xeloda?  Has finished all her Xeloda now.

## 2014-02-14 ENCOUNTER — Ambulatory Visit (HOSPITAL_COMMUNITY)
Admission: RE | Admit: 2014-02-14 | Discharge: 2014-02-14 | Disposition: A | Discharge status: Home / Self Care | Payer: Medicare Other | Source: Ambulatory Visit | Attending: Nurse Practitioner | Admitting: Nurse Practitioner

## 2014-02-14 ENCOUNTER — Other Ambulatory Visit: Payer: Medicare Other

## 2014-02-14 ENCOUNTER — Ambulatory Visit (HOSPITAL_BASED_OUTPATIENT_CLINIC_OR_DEPARTMENT_OTHER): Payer: Medicare Other

## 2014-02-14 ENCOUNTER — Encounter (HOSPITAL_COMMUNITY): Payer: Self-pay

## 2014-02-14 DIAGNOSIS — R918 Other nonspecific abnormal finding of lung field: Secondary | ICD-10-CM | POA: Diagnosis not present

## 2014-02-14 DIAGNOSIS — C182 Malignant neoplasm of ascending colon: Secondary | ICD-10-CM | POA: Insufficient documentation

## 2014-02-14 DIAGNOSIS — Z9221 Personal history of antineoplastic chemotherapy: Secondary | ICD-10-CM | POA: Insufficient documentation

## 2014-02-14 LAB — COMPREHENSIVE METABOLIC PANEL (CC13)
ALK PHOS: 72 U/L (ref 40–150)
ALT: 15 U/L (ref 0–55)
ANION GAP: 11 meq/L (ref 3–11)
AST: 19 U/L (ref 5–34)
Albumin: 3.9 g/dL (ref 3.5–5.0)
BILIRUBIN TOTAL: 0.59 mg/dL (ref 0.20–1.20)
BUN: 11.3 mg/dL (ref 7.0–26.0)
CO2: 25 meq/L (ref 22–29)
CREATININE: 0.8 mg/dL (ref 0.6–1.1)
Calcium: 9.9 mg/dL (ref 8.4–10.4)
Chloride: 105 mEq/L (ref 98–109)
EGFR: 62 mL/min/{1.73_m2} — AB (ref >90)
GLUCOSE: 111 mg/dL (ref 70–140)
Potassium: 4 mEq/L (ref 3.5–5.1)
SODIUM: 142 meq/L (ref 136–145)
TOTAL PROTEIN: 7.3 g/dL (ref 6.4–8.3)

## 2014-02-14 LAB — CBC WITH DIFFERENTIAL/PLATELET
BASO%: 0.7 % (ref 0.0–2.0)
Basophils Absolute: 0.1 10*3/uL (ref 0.0–0.1)
EOS%: 7.3 % — ABNORMAL HIGH (ref 0.0–7.0)
Eosinophils Absolute: 0.5 10*3/uL (ref 0.0–0.5)
HEMATOCRIT: 38.1 % (ref 34.8–46.6)
HGB: 12.7 g/dL (ref 11.6–15.9)
LYMPH%: 17.7 % (ref 14.0–49.7)
MCH: 34.7 pg — AB (ref 25.1–34.0)
MCHC: 33.4 g/dL (ref 31.5–36.0)
MCV: 103.8 fL — AB (ref 79.5–101.0)
MONO#: 0.6 10*3/uL (ref 0.1–0.9)
MONO%: 7.9 % (ref 0.0–14.0)
NEUT#: 4.9 10*3/uL (ref 1.5–6.5)
NEUT%: 66.4 % (ref 38.4–76.8)
PLATELETS: 222 10*3/uL (ref 145–400)
RBC: 3.67 10*6/uL — AB (ref 3.70–5.45)
RDW: 15.8 % — ABNORMAL HIGH (ref 11.2–14.5)
WBC: 7.4 10*3/uL (ref 3.9–10.3)
lymph#: 1.3 10*3/uL (ref 0.9–3.3)

## 2014-02-14 MED ORDER — IOHEXOL 300 MG/ML  SOLN
100.0000 mL | Freq: Once | INTRAMUSCULAR | Status: AC | PRN
  Administered 2014-02-14: 100 mL via INTRAVENOUS

## 2014-02-16 ENCOUNTER — Telehealth: Payer: Self-pay | Admitting: Nurse Practitioner

## 2014-02-16 ENCOUNTER — Ambulatory Visit (HOSPITAL_BASED_OUTPATIENT_CLINIC_OR_DEPARTMENT_OTHER): Payer: Medicare Other | Admitting: Nurse Practitioner

## 2014-02-16 VITALS — BP 163/79 | HR 90 | Temp 97.8°F | Resp 18 | Ht 64.0 in | Wt 143.8 lb

## 2014-02-16 DIAGNOSIS — C182 Malignant neoplasm of ascending colon: Secondary | ICD-10-CM

## 2014-02-16 DIAGNOSIS — C183 Malignant neoplasm of hepatic flexure: Secondary | ICD-10-CM

## 2014-02-16 DIAGNOSIS — R911 Solitary pulmonary nodule: Secondary | ICD-10-CM

## 2014-02-16 NOTE — Progress Notes (Addendum)
  [place] OFFICE PROGRESS NOTE   Diagnosis:  Colon cancer   INTERVAL HISTORY:   [name] returns as scheduled. She completed the eighth and final cycle of adjuvant Xeloda beginning 01/17/2014. She denies nausea/vomiting. No mouth sores. No diarrhea. No hand or foot pain or redness. The skin rash is beginning to improve. She notes less pruritus.  Objective:  Vital signs in last 24 hours:  Blood pressure 163/79, pulse 90, temperature 97.8 F (36.6 C), temperature source Oral, resp. rate 18, height 5\' 4"  (1.626 m), weight 143 lb 12.8 oz (65.227 kg).    HEENT: no thrush or ulcers. Lymphatics: no palpable cervical, supraclavicular or axillary lymph nodes. Resp: lungs clear bilaterally. Cardio: regular rate and rhythm. GI: abdomen soft and nontender. No hepatomegaly. Right lower quadrant colostomy. Vascular: no leg edema.  Skin: erythematous maculopapular rash over the trunk and extremities appears improved.    Lab Results:  Lab Results  Component Value Date   WBC 7.4 02/14/2014   HGB 12.7 02/14/2014   HCT 38.1 02/14/2014   MCV 103.8* 02/14/2014   PLT 222 02/14/2014   NEUTROABS 4.9 02/14/2014    Imaging:  No results found.  Medications: I have reviewed the patient's current medications.  Assessment/Plan: 1. Stage IIIB (T3 N2a) poorly different it adenocarcinoma of the right colon, status post a right colectomy and end ileostomy 07/09/2013.  4 of 15 lymph nodes positive for metastatic carcinoma and 2 tumor deposits.   Cycle 1 adjuvant Xeloda 08/15/2013.   Cycle 2 adjuvant Xeloda 09/05/2013.   Cycle 3 adjuvant Xeloda with a dose reduction 10/03/2013.   Cycle 4 adjuvant Xeloda 10/25/2013.   Cycle 5 adjuvant Xeloda 11/16/2013.  Cycle 6 adjuvant Xeloda 12/06/2013  Cycle 7 of adjuvant Xeloda 12/27/2013  Cycle 8 of adjuvant Xeloda 01/17/2014.  Restaging CT scans 02/14/2014 with stable 4 to 5 mm right lower lobe pulmonary nodule; interval  enlargement of left periaortic lymphadenopathy. 2. History of multiple nonmelanoma skin cancers. 3. History of hypertension. 4. Indeterminate 4 mm right lung base nodule on the CT abdomen 07/06/2013.  Chest CT 08/05/2013 with middle and right lower lobe nodules measuring up to 4 mm. Repeat chest CT planned at a 6-9 month interval.  Restaging chest CT 02/14/2014 with stable 4-5 mm right lower lobe pulmonary nodule. 5. Rash secondary to Xeloda. Improved. 6. 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   Disposition: [name] appears stable. She has completed the planned course of adjuvant Xeloda chemotherapy. The recent restaging CT scans showed a stable 4-5 mm right lower lobe pulmonary nodule and interval enlargement of left periaortic lymphadenopathy. Dr. Benay Spice reviewed the CT result with her and recommends repeat CT imaging at a 4 to 3-month interval. Her case will be presented at the GI tumor conference.  She will return for a followup visit and CEA in 3 months. She will contact the office in the interim with any problems.  Patient seen with Dr. Benay Spice.  Ned Card ANP/GNP-BC   02/16/2014  1:50 PM  This was a shared visit with Ned Card. I discussed the CT findings with [name]. The small retroperitoneal/pelvic lymph nodes may represent progressive colon cancer, benign nodes, or another malignant process such as lymphoma. I do not think this will interfere with the planned ileostomy reversal.  I will present her case at the GI tumor conference within the next 2 weeks. We will schedule a restaging CT evaluation in 4-5 months.  Julieanne Manson, M.D.

## 2014-02-16 NOTE — Telephone Encounter (Signed)
Gave avs & cal for March 2016. °

## 2014-03-21 ENCOUNTER — Encounter (HOSPITAL_COMMUNITY): Payer: Self-pay

## 2014-03-21 ENCOUNTER — Encounter (HOSPITAL_COMMUNITY)
Admission: RE | Admit: 2014-03-21 | Discharge: 2014-03-21 | Disposition: A | Discharge status: Home / Self Care | Payer: Medicare Other | Source: Ambulatory Visit | Attending: Surgery | Admitting: Surgery

## 2014-03-21 DIAGNOSIS — I10 Essential (primary) hypertension: Secondary | ICD-10-CM

## 2014-03-21 DIAGNOSIS — Z01818 Encounter for other preprocedural examination: Secondary | ICD-10-CM | POA: Insufficient documentation

## 2014-03-21 DIAGNOSIS — C189 Malignant neoplasm of colon, unspecified: Secondary | ICD-10-CM | POA: Insufficient documentation

## 2014-03-21 DIAGNOSIS — Z01812 Encounter for preprocedural laboratory examination: Secondary | ICD-10-CM

## 2014-03-21 HISTORY — DX: Unspecified osteoarthritis, unspecified site: M19.90

## 2014-03-21 HISTORY — DX: Malignant (primary) neoplasm, unspecified: C80.1

## 2014-03-21 LAB — CBC
HCT: 39.5 % (ref 36.0–46.0)
HEMOGLOBIN: 13 g/dL (ref 12.0–15.0)
MCH: 32.6 pg (ref 26.0–34.0)
MCHC: 32.9 g/dL (ref 30.0–36.0)
MCV: 99 fL (ref 78.0–100.0)
PLATELETS: 260 10*3/uL (ref 150–400)
RBC: 3.99 MIL/uL (ref 3.87–5.11)
RDW: 13 % (ref 11.5–15.5)
WBC: 7.5 10*3/uL (ref 4.0–10.5)

## 2014-03-21 LAB — BASIC METABOLIC PANEL
Anion gap: 7 (ref 5–15)
BUN: 16 mg/dL (ref 6–23)
CALCIUM: 10 mg/dL (ref 8.4–10.5)
CO2: 27 mmol/L (ref 19–32)
CREATININE: 0.71 mg/dL (ref 0.50–1.10)
Chloride: 105 mEq/L (ref 96–112)
GFR calc Af Amer: 87 mL/min — ABNORMAL LOW (ref >90)
GFR, EST NON AFRICAN AMERICAN: 75 mL/min — AB (ref >90)
GLUCOSE: 113 mg/dL — AB (ref 70–99)
Potassium: 5.3 mmol/L — ABNORMAL HIGH (ref 3.5–5.1)
Sodium: 139 mmol/L (ref 135–145)

## 2014-03-21 NOTE — Patient Instructions (Addendum)
HELOISE GORDAN  03/21/2014   Your procedure is scheduled on: Thursday 03/23/2014  Report to [facility] Main  Entrance and follow signs to               [place] at  [place].  Call this number if you have problems the morning of surgery [phone]   Remember: [place] DIET AS PER DR. GERKIN'S OFFICE !   Do not eat food or drink liquids :After Midnight.     Take these medicines the morning of surgery with A SIP OF WATER: Amlodipine                               You may not have any metal on your body including hair pins and              piercings  Do not wear jewelry, make-up, lotions, powders or perfumes.             Do not wear nail polish.  Do not shave  48 hours prior to surgery.              Men may shave face and neck.   Do not bring valuables to the hospital. [place].  Contacts, dentures or bridgework may not be worn into surgery.  Leave suitcase in the car. After surgery it may be brought to your room.     Patients discharged the day of surgery will not be allowed to drive home.  Name and phone number of your driver:  Special Instructions: N/A              Please read over the following fact sheets you were given: _____________________________________________________________________             [facility] - Preparing for Surgery Before surgery, you can play an important role.  Because skin is not sterile, your skin needs to be as free of germs as possible.  You can reduce the number of germs on your skin by washing with CHG (chlorahexidine gluconate) soap before surgery.  CHG is an antiseptic cleaner which kills germs and bonds with the skin to continue killing germs even after washing. Please DO NOT use if you have an allergy to CHG or antibacterial soaps.  If your skin becomes reddened/irritated stop using the CHG and inform your nurse when you  arrive at Short Stay. Do not shave (including legs and underarms) for at least 48 hours prior to the first CHG shower.  You may shave your face/neck. Please follow these instructions carefully:  1.  Shower with CHG Soap the night before surgery and the  morning of Surgery.  2.  If you choose to wash your hair, wash your hair first as usual with your  normal  shampoo.  3.  After you shampoo, rinse your hair and body thoroughly to remove the  shampoo.                           4.  Use CHG as you would any other liquid soap.  You can apply chg directly  to the skin and wash  Gently with a scrungie or clean washcloth.  5.  Apply the CHG Soap to your body ONLY FROM THE NECK DOWN.   Do not use on face/ open                           Wound or open sores. Avoid contact with eyes, ears mouth and genitals (private parts).                       Wash face,  Genitals (private parts) with your normal soap.             6.  Wash thoroughly, paying special attention to the area where your surgery  will be performed.  7.  Thoroughly rinse your body with warm water from the neck down.  8.  DO NOT shower/wash with your normal soap after using and rinsing off  the CHG Soap.                9.  Pat yourself dry with a clean towel.            10.  Wear clean pajamas.            11.  Place clean sheets on your bed the night of your first shower and do not  sleep with pets. Day of Surgery : Do not apply any lotions/deodorants the morning of surgery.  Please wear clean clothes to the hospital/surgery center.  FAILURE TO FOLLOW THESE INSTRUCTIONS MAY RESULT IN THE CANCELLATION OF YOUR SURGERY PATIENT SIGNATURE_________________________________  NURSE SIGNATURE__________________________________  ________________________________________________________________________   Adam Phenix  An incentive spirometer is a tool that can help keep your lungs clear and active. This tool measures how  well you are filling your lungs with each breath. Taking long deep breaths may help reverse or decrease the chance of developing breathing (pulmonary) problems (especially infection) following:  A long period of time when you are unable to move or be active. BEFORE THE PROCEDURE   If the spirometer includes an indicator to show your best effort, your nurse or respiratory therapist will set it to a desired goal.  If possible, sit up straight or lean slightly forward. Try not to slouch.  Hold the incentive spirometer in an upright position. INSTRUCTIONS FOR USE  1. Sit on the edge of your bed if possible, or sit up as far as you can in bed or on a chair. 2. Hold the incentive spirometer in an upright position. 3. Breathe out normally. 4. Place the mouthpiece in your mouth and seal your lips tightly around it. 5. Breathe in slowly and as deeply as possible, raising the piston or the ball toward the top of the column. 6. Hold your breath for 3-5 seconds or for as long as possible. Allow the piston or ball to fall to the bottom of the column. 7. Remove the mouthpiece from your mouth and breathe out normally. 8. Rest for a few seconds and repeat Steps 1 through 7 at least 10 times every 1-2 hours when you are awake. Take your time and take a few normal breaths between deep breaths. 9. The spirometer may include an indicator to show your best effort. Use the indicator as a goal to work toward during each repetition. 10. After each set of 10 deep breaths, practice coughing to be sure your lungs are clear. If you have an incision (the cut made at the time of surgery),  support your incision when coughing by placing a pillow or rolled up towels firmly against it. Once you are able to get out of bed, walk around indoors and cough well. You may stop using the incentive spirometer when instructed by your caregiver.  RISKS AND COMPLICATIONS  Take your time so you do not get dizzy or light-headed.  If you  are in pain, you may need to take or ask for pain medication before doing incentive spirometry. It is harder to take a deep breath if you are having pain. AFTER USE  Rest and breathe slowly and easily.  It can be helpful to keep track of a log of your progress. Your caregiver can provide you with a simple table to help with this. If you are using the spirometer at home, follow these instructions: [place] IF:   You are having difficultly using the spirometer.  You have trouble using the spirometer as often as instructed.  Your pain medication is not giving enough relief while using the spirometer.  You develop fever of 100.5 F (38.1 C) or higher. SEEK IMMEDIATE MEDICAL CARE IF:   You cough up bloody sputum that had not been present before.  You develop fever of 102 F (38.9 C) or greater.  You develop worsening pain at or near the incision site. MAKE SURE YOU:   Understand these instructions.  Will watch your condition.  Will get help right away if you are not doing well or get worse. Document Released: 07/07/2006 Document Revised: 05/19/2011 Document Reviewed: 09/07/2006 ExitCare Patient Information 2014 [place].   ________________________________________________________________________    CLEAR LIQUID DIET   Foods Allowed                                                                     Foods Excluded  Coffee and tea, regular and decaf                             liquids that you cannot  Plain Jell-O in any flavor                                             see through such as: Fruit ices (not with fruit pulp)                                     milk, soups, orange juice  Iced Popsicles                                    All solid food Carbonated beverages, regular and diet                                    Cranberry, grape and apple juices Sports drinks like Gatorade Lightly seasoned clear broth or consume(fat free) Sugar, honey  syrup  Sample Menu Breakfast  Lunch                                     Supper Cranberry juice                    Beef broth                            Chicken broth Jell-O                                     Grape juice                           Apple juice Coffee or tea                        Jell-O                                      Popsicle                                                Coffee or tea                        Coffee or tea  _____________________________________________________________________

## 2014-03-22 ENCOUNTER — Encounter (HOSPITAL_COMMUNITY): Payer: Self-pay | Admitting: Surgery

## 2014-03-22 NOTE — Progress Notes (Signed)
Quick Note:  These results are acceptable for scheduled surgery.  Annahi Short M. Hetvi Shawhan, MD, FACS [facility] Office: [phone]   ______ 

## 2014-03-22 NOTE — H&P (Signed)
General Surgery [facility]  [name] DOB: [birth year] Widowed / Language: English / Race: White Female  History of Present Illness The patient is a 81 year old female who presents with a complaint of evaluate for ileostomy closure.   Patient returns to discuss ileostomy closure. Patient underwent right colectomy with end ileostomy for obstructing adenocarcinoma of the colon in May 2015. She is soon to complete adjuvant chemotherapy. She has developed a moderate sized peristomal hernia. She would like to consider ileostomy closure in January 2016.  Patient has done well with her treatment overall. She is scheduled to see her medical oncologist later this week. She notes development of the peristomal hernia over the past few months. It has gradually become larger. She denies pain. It does not appear to interfere with the functioning of her ileostomy.    Other Problems Colon Cancer High blood pressure  Past Surgical History Colon Removal - Partial Hip Surgery Right.  Diagnostic Studies History Colonoscopy within last year Mammogram within last year Pap Smear 1-5 years ago  Allergies  No Known Drug Allergies11/04/2013  Medication History AmLODIPine Besylate (Oral) Specific dose unknown - Active. Atorvastatin Calcium (Oral) Specific dose unknown - Active. Capecitabine (Oral) Specific dose unknown - Active.  Social History  Alcohol use Occasional alcohol use. Caffeine use Coffee. No drug use Tobacco use Never smoker.  Family History Heart disease in female family member before age 70  Pregnancy / Birth History  Age at menarche 89 years. Age of menopause 67-50 Gravida 3 Maternal age 73-25 Para 3  Review of Systems  General Not Present- Appetite Loss, Chills, Fatigue, Fever, Night Sweats, Weight Gain and Weight Loss. Skin Present- Dryness and Rash. Not Present- Change in Wart/Mole, Hives, Jaundice, New Lesions,  Non-Healing Wounds and Ulcer. HEENT Present- Wears glasses/contact lenses. Not Present- Earache, Hearing Loss, Hoarseness, Nose Bleed, Oral Ulcers, Ringing in the Ears, Seasonal Allergies, Sinus Pain, Sore Throat, Visual Disturbances and Yellow Eyes. Respiratory Not Present- Bloody sputum, Chronic Cough, Difficulty Breathing, Snoring and Wheezing. Cardiovascular Not Present- Chest Pain, Difficulty Breathing Lying Down, Leg Cramps, Palpitations, Rapid Heart Rate, Shortness of Breath and Swelling of Extremities. Gastrointestinal Not Present- Abdominal Pain, Bloating, Bloody Stool, Change in Bowel Habits, Chronic diarrhea, Constipation, Difficulty Swallowing, Excessive gas, Gets full quickly at meals, Hemorrhoids, Indigestion, Nausea, Rectal Pain and Vomiting. Female Genitourinary Not Present- Frequency, Nocturia, Painful Urination, Pelvic Pain and Urgency. Musculoskeletal Not Present- Back Pain, Joint Pain, Joint Stiffness, Muscle Pain, Muscle Weakness and Swelling of Extremities. Neurological Not Present- Decreased Memory, Fainting, Headaches, Numbness, Seizures, Tingling, Tremor, Trouble walking and Weakness. Psychiatric Not Present- Anxiety, Bipolar, Change in Sleep Pattern, Depression, Fearful and Frequent crying. Endocrine Not Present- Cold Intolerance, Excessive Hunger, Hair Changes, Heat Intolerance, Hot flashes and New Diabetes. Hematology Not Present- Easy Bruising, Excessive bleeding, Gland problems, HIV and Persistent Infections.   Vitals  01/09/2014 3:24 PM Weight: 144 lb Height: 64in Body Surface Area: 1.72 m Body Mass Index: 24.72 kg/m Temp.: 98.71F(Oral)  Pulse: 88 (Regular)  Resp.: 18 (Unlabored)  BP: 166/82 (Sitting, Left Arm, Standard)    Physical Exam  General - appears comfortable, no distress; not diaphorectic  HEENT - normocephalic; sclerae clear, gaze conjugate; mucous membranes moist, dentition good; voice normal  Neck - symmetric on extension; no  palpable anterior or posterior cervical adenopathy; no palpable masses in the thyroid bed  Chest - clear bilaterally with rhonchi, rales, or wheeze  Cor - regular rhythm with normal rate; no significant murmur  Abd - soft without distension; midline incision well healed; ileostomy viable RLQ; moderate parastomal hernia, not reducible, soft, non-tender  Ext - non-tender without significant edema or lymphedema  Neuro - grossly intact; no tremor    Assessment & Plan  ADENOCARCINOMA OF COLON (153.9  C18.9)  I discussed the patient's recent clinical history with the patient and her daughter today. We reviewed the steps that would be necessary to proceed with closure of her ileostomy and concurrent repair of her parastomal hernia. We discussed options for management.  The patient and her daughter are interested in ileostomy closure and repair of her parastomal hernia. They would like to proceed with this in January of 2016. Patient should complete her chemotherapy by Thanksgiving. I suspect that additional scans will be performed for staging. If it is acceptable to her medical oncologist, we will proceed with scheduling ileostomy takedown and repair of parastomal hernia in January 2016.  I discussed the procedure, the incisions involved, the fact that she will likely have an open wound at the ostomy site which will require dressing changes, and the hospital stay to be anticipated with the patient and her daughter. They understand and agree to proceed.  The risks and benefits of the procedure have been discussed at length with the patient. The patient understands the proposed procedure, potential alternative treatments, and the course of recovery to be expected. All of the patient's questions have been answered at this time. The patient wishes to proceed with surgery.  PARASTOMAL HERNIA WITHOUT OBSTRUCTION OR GANGRENE (569.69  K43.5)  Earnstine Regal, MD, [facility] Office: [phone]

## 2014-03-23 ENCOUNTER — Inpatient Hospital Stay (HOSPITAL_COMMUNITY): Payer: Medicare Other | Admitting: Anesthesiology

## 2014-03-23 ENCOUNTER — Inpatient Hospital Stay (HOSPITAL_COMMUNITY)
Admission: RE | Admit: 2014-03-23 | Discharge: 2014-03-29 | DRG: 346 | Disposition: A | Discharge status: Home / Self Care | Payer: Medicare Other | Source: Ambulatory Visit | Attending: Surgery | Admitting: Surgery

## 2014-03-23 ENCOUNTER — Encounter (HOSPITAL_COMMUNITY)
Admission: RE | Disposition: A | Discharge status: Home / Self Care | Payer: Self-pay | Source: Ambulatory Visit | Attending: Surgery

## 2014-03-23 ENCOUNTER — Encounter (HOSPITAL_COMMUNITY): Payer: Self-pay | Admitting: *Deleted

## 2014-03-23 DIAGNOSIS — Z432 Encounter for attention to ileostomy: Principal | ICD-10-CM

## 2014-03-23 DIAGNOSIS — F1590 Other stimulant use, unspecified, uncomplicated: Secondary | ICD-10-CM | POA: Diagnosis present

## 2014-03-23 DIAGNOSIS — I10 Essential (primary) hypertension: Secondary | ICD-10-CM | POA: Diagnosis present

## 2014-03-23 DIAGNOSIS — K660 Peritoneal adhesions (postprocedural) (postinfection): Secondary | ICD-10-CM | POA: Diagnosis present

## 2014-03-23 DIAGNOSIS — Z85038 Personal history of other malignant neoplasm of large intestine: Secondary | ICD-10-CM

## 2014-03-23 DIAGNOSIS — K435 Parastomal hernia without obstruction or  gangrene: Secondary | ICD-10-CM | POA: Diagnosis present

## 2014-03-23 DIAGNOSIS — Z932 Ileostomy status: Secondary | ICD-10-CM

## 2014-03-23 DIAGNOSIS — Z9049 Acquired absence of other specified parts of digestive tract: Secondary | ICD-10-CM | POA: Diagnosis present

## 2014-03-23 HISTORY — PX: PARASTOMAL HERNIA REPAIR: SHX2162

## 2014-03-23 HISTORY — PX: ILEOSTOMY CLOSURE: SHX1784

## 2014-03-23 LAB — CBC
HCT: 37.5 % (ref 36.0–46.0)
HEMOGLOBIN: 12.6 g/dL (ref 12.0–15.0)
MCH: 32.6 pg (ref 26.0–34.0)
MCHC: 33.6 g/dL (ref 30.0–36.0)
MCV: 96.9 fL (ref 78.0–100.0)
Platelets: 270 10*3/uL (ref 150–400)
RBC: 3.87 MIL/uL (ref 3.87–5.11)
RDW: 13.1 % (ref 11.5–15.5)
WBC: 14.1 10*3/uL — AB (ref 4.0–10.5)

## 2014-03-23 LAB — CREATININE, SERUM
CREATININE: 1.05 mg/dL (ref 0.50–1.10)
GFR calc Af Amer: 54 mL/min — ABNORMAL LOW (ref >90)
GFR, EST NON AFRICAN AMERICAN: 46 mL/min — AB (ref >90)

## 2014-03-23 LAB — TYPE AND SCREEN
ABO/RH(D): A POS
Antibody Screen: NEGATIVE

## 2014-03-23 SURGERY — CLOSURE, ILEOSTOMY
Anesthesia: General

## 2014-03-23 MED ORDER — AMLODIPINE BESYLATE 5 MG PO TABS
5.0000 mg | ORAL_TABLET | Freq: Every day | ORAL | Status: DC
  Administered 2014-03-24 – 2014-03-29 (×6): 5 mg via ORAL
  Filled 2014-03-23 (×6): qty 1

## 2014-03-23 MED ORDER — PROMETHAZINE HCL 25 MG/ML IJ SOLN: 6.2500 mg | INTRAMUSCULAR | Status: DC | PRN

## 2014-03-23 MED ORDER — FENTANYL CITRATE 0.05 MG/ML IJ SOLN
INTRAMUSCULAR | Status: AC
  Filled 2014-03-23: qty 5

## 2014-03-23 MED ORDER — ETOMIDATE 2 MG/ML IV SOLN
INTRAVENOUS | Status: DC | PRN
  Administered 2014-03-23: 10 mg via INTRAVENOUS

## 2014-03-23 MED ORDER — LIP MEDEX EX OINT
TOPICAL_OINTMENT | CUTANEOUS | Status: DC | PRN
  Administered 2014-03-23: 22:00:00 via TOPICAL
  Filled 2014-03-23: qty 7

## 2014-03-23 MED ORDER — HYDROMORPHONE HCL 1 MG/ML IJ SOLN
INTRAMUSCULAR | Status: DC | PRN
  Administered 2014-03-23: 0.5 mg via INTRAVENOUS

## 2014-03-23 MED ORDER — ONDANSETRON HCL 4 MG/2ML IJ SOLN: 4.0000 mg | Freq: Four times a day (QID) | INTRAMUSCULAR | Status: DC | PRN

## 2014-03-23 MED ORDER — MORPHINE SULFATE (PF) 1 MG/ML IV SOLN
INTRAVENOUS | Status: AC
  Filled 2014-03-23: qty 25

## 2014-03-23 MED ORDER — LOSARTAN POTASSIUM-HCTZ 100-25 MG PO TABS: 1.0000 | ORAL_TABLET | Freq: Every day | ORAL | Status: DC

## 2014-03-23 MED ORDER — ONDANSETRON HCL 4 MG/2ML IJ SOLN
INTRAMUSCULAR | Status: AC
  Filled 2014-03-23: qty 2

## 2014-03-23 MED ORDER — ALVIMOPAN 12 MG PO CAPS
12.0000 mg | ORAL_CAPSULE | Freq: Two times a day (BID) | ORAL | Status: DC
  Administered 2014-03-24 – 2014-03-26 (×6): 12 mg via ORAL
  Filled 2014-03-23 (×8): qty 1

## 2014-03-23 MED ORDER — ALVIMOPAN 12 MG PO CAPS
12.0000 mg | ORAL_CAPSULE | Freq: Once | ORAL | Status: AC
  Administered 2014-03-23: 12 mg via ORAL
  Filled 2014-03-23: qty 1

## 2014-03-23 MED ORDER — PROPOFOL 10 MG/ML IV BOLUS
INTRAVENOUS | Status: DC | PRN
  Administered 2014-03-23: 100 mg via INTRAVENOUS

## 2014-03-23 MED ORDER — HYDROMORPHONE HCL 2 MG/ML IJ SOLN
INTRAMUSCULAR | Status: AC
  Filled 2014-03-23: qty 1

## 2014-03-23 MED ORDER — GLYCOPYRROLATE 0.2 MG/ML IJ SOLN
INTRAMUSCULAR | Status: DC | PRN
  Administered 2014-03-23: 0.6 mg via INTRAVENOUS

## 2014-03-23 MED ORDER — DIPHENHYDRAMINE HCL 12.5 MG/5ML PO ELIX: 12.5000 mg | ORAL_SOLUTION | Freq: Four times a day (QID) | ORAL | Status: DC | PRN

## 2014-03-23 MED ORDER — NEOSTIGMINE METHYLSULFATE 10 MG/10ML IV SOLN
INTRAVENOUS | Status: AC
  Filled 2014-03-23: qty 1

## 2014-03-23 MED ORDER — NEOSTIGMINE METHYLSULFATE 10 MG/10ML IV SOLN
INTRAVENOUS | Status: DC | PRN
  Administered 2014-03-23: 4 mg via INTRAVENOUS

## 2014-03-23 MED ORDER — METOCLOPRAMIDE HCL 5 MG/ML IJ SOLN
INTRAMUSCULAR | Status: AC
  Filled 2014-03-23: qty 2

## 2014-03-23 MED ORDER — METOCLOPRAMIDE HCL 5 MG/ML IJ SOLN
INTRAMUSCULAR | Status: DC | PRN
  Administered 2014-03-23: 10 mg via INTRAVENOUS

## 2014-03-23 MED ORDER — MORPHINE SULFATE (PF) 1 MG/ML IV SOLN
INTRAVENOUS | Status: DC
Start: 2014-03-23 — End: 2014-03-26
  Administered 2014-03-23: 10:00:00 via INTRAVENOUS
  Administered 2014-03-23: 7 mg via INTRAVENOUS
  Administered 2014-03-23: 5 mg via INTRAVENOUS
  Administered 2014-03-24: 1 mg via INTRAVENOUS
  Administered 2014-03-24: 3 mg via INTRAVENOUS
  Administered 2014-03-24: 4 mg via INTRAVENOUS
  Administered 2014-03-24: 25 mg via INTRAVENOUS
  Administered 2014-03-24 (×2): 3 mg via INTRAVENOUS
  Administered 2014-03-24 – 2014-03-25 (×2): 2 mg via INTRAVENOUS
  Administered 2014-03-25: 1 mg via INTRAVENOUS
  Filled 2014-03-23: qty 25

## 2014-03-23 MED ORDER — FENTANYL CITRATE 0.05 MG/ML IJ SOLN
INTRAMUSCULAR | Status: DC | PRN
  Administered 2014-03-23 (×5): 50 ug via INTRAVENOUS

## 2014-03-23 MED ORDER — 0.9 % SODIUM CHLORIDE (POUR BTL) OPTIME
TOPICAL | Status: DC | PRN
  Administered 2014-03-23: 4000 mL

## 2014-03-23 MED ORDER — LACTATED RINGERS IV SOLN
INTRAVENOUS | Status: DC | PRN
  Administered 2014-03-23 (×2): via INTRAVENOUS

## 2014-03-23 MED ORDER — ONDANSETRON HCL 4 MG PO TABS: 4.0000 mg | ORAL_TABLET | Freq: Four times a day (QID) | ORAL | Status: DC | PRN

## 2014-03-23 MED ORDER — FENTANYL CITRATE 0.05 MG/ML IJ SOLN
INTRAMUSCULAR | Status: AC
  Filled 2014-03-23: qty 2

## 2014-03-23 MED ORDER — CISATRACURIUM BESYLATE (PF) 10 MG/5ML IV SOLN
INTRAVENOUS | Status: DC | PRN
  Administered 2014-03-23: 8 mg via INTRAVENOUS
  Administered 2014-03-23: 3 mg via INTRAVENOUS

## 2014-03-23 MED ORDER — LOSARTAN POTASSIUM 50 MG PO TABS
100.0000 mg | ORAL_TABLET | Freq: Every day | ORAL | Status: DC
  Administered 2014-03-24 – 2014-03-29 (×6): 100 mg via ORAL
  Filled 2014-03-23 (×6): qty 2

## 2014-03-23 MED ORDER — ETOMIDATE 2 MG/ML IV SOLN
INTRAVENOUS | Status: AC
  Filled 2014-03-23: qty 10

## 2014-03-23 MED ORDER — PROPOFOL 10 MG/ML IV BOLUS
INTRAVENOUS | Status: AC
  Filled 2014-03-23: qty 20

## 2014-03-23 MED ORDER — DEXAMETHASONE SODIUM PHOSPHATE 10 MG/ML IJ SOLN
INTRAMUSCULAR | Status: AC
  Filled 2014-03-23: qty 1

## 2014-03-23 MED ORDER — DEXTROSE 5 % IV SOLN
2.0000 g | INTRAVENOUS | Status: AC
  Administered 2014-03-23: 2 g via INTRAVENOUS

## 2014-03-23 MED ORDER — SODIUM CHLORIDE 0.9 % IJ SOLN: 9.0000 mL | INTRAMUSCULAR | Status: DC | PRN

## 2014-03-23 MED ORDER — FENTANYL CITRATE 0.05 MG/ML IJ SOLN
25.0000 ug | INTRAMUSCULAR | Status: DC | PRN
Start: 2014-03-23 — End: 2014-03-23
  Administered 2014-03-23: 25 ug via INTRAVENOUS

## 2014-03-23 MED ORDER — SODIUM CHLORIDE 0.9 % IJ SOLN
INTRAMUSCULAR | Status: AC
  Filled 2014-03-23: qty 10

## 2014-03-23 MED ORDER — ALVIMOPAN 12 MG PO CAPS: 12.0000 mg | ORAL_CAPSULE | Freq: Once | ORAL | Status: DC

## 2014-03-23 MED ORDER — LACTATED RINGERS IV SOLN: INTRAVENOUS | Status: DC

## 2014-03-23 MED ORDER — ONDANSETRON HCL 4 MG/2ML IJ SOLN
INTRAMUSCULAR | Status: DC | PRN
  Administered 2014-03-23: 4 mg via INTRAVENOUS

## 2014-03-23 MED ORDER — MEPERIDINE HCL 50 MG/ML IJ SOLN
6.2500 mg | INTRAMUSCULAR | Status: DC | PRN
Start: 2014-03-23 — End: 2014-03-23

## 2014-03-23 MED ORDER — HYDROCHLOROTHIAZIDE 25 MG PO TABS
25.0000 mg | ORAL_TABLET | Freq: Every day | ORAL | Status: DC
  Administered 2014-03-24 – 2014-03-29 (×4): 25 mg via ORAL
  Filled 2014-03-23 (×7): qty 1

## 2014-03-23 MED ORDER — ENOXAPARIN SODIUM 40 MG/0.4ML ~~LOC~~ SOLN
40.0000 mg | SUBCUTANEOUS | Status: DC
  Administered 2014-03-24 – 2014-03-29 (×6): 40 mg via SUBCUTANEOUS
  Filled 2014-03-23 (×8): qty 0.4

## 2014-03-23 MED ORDER — KCL IN DEXTROSE-NACL 20-5-0.45 MEQ/L-%-% IV SOLN
INTRAVENOUS | Status: DC
  Administered 2014-03-23 – 2014-03-26 (×6): via INTRAVENOUS
  Filled 2014-03-23 (×9): qty 1000

## 2014-03-23 MED ORDER — DEXAMETHASONE SODIUM PHOSPHATE 10 MG/ML IJ SOLN
INTRAMUSCULAR | Status: DC | PRN
  Administered 2014-03-23: 10 mg via INTRAVENOUS

## 2014-03-23 MED ORDER — DEXTROSE 5 % IV SOLN
INTRAVENOUS | Status: AC
  Filled 2014-03-23: qty 2

## 2014-03-23 MED ORDER — GLYCOPYRROLATE 0.2 MG/ML IJ SOLN
INTRAMUSCULAR | Status: AC
Start: 2014-03-23 — End: 2014-03-23
  Filled 2014-03-23: qty 3

## 2014-03-23 MED ORDER — NALOXONE HCL 0.4 MG/ML IJ SOLN: 0.4000 mg | INTRAMUSCULAR | Status: DC | PRN

## 2014-03-23 MED ORDER — DIPHENHYDRAMINE HCL 50 MG/ML IJ SOLN: 12.5000 mg | Freq: Four times a day (QID) | INTRAMUSCULAR | Status: DC | PRN

## 2014-03-23 MED ORDER — CISATRACURIUM BESYLATE 20 MG/10ML IV SOLN
INTRAVENOUS | Status: AC
  Filled 2014-03-23: qty 10

## 2014-03-23 SURGICAL SUPPLY — 62 items
APPLICATOR COTTON TIP 6IN STRL (MISCELLANEOUS) IMPLANT
BINDER ABD UNIV 12 45-62 (WOUND CARE) ×1 IMPLANT
BINDER ABDOMINAL 46IN 62IN (WOUND CARE) ×3
BLADE EXTENDED COATED 6.5IN (ELECTRODE) IMPLANT
BLADE HEX COATED 2.75 (ELECTRODE) ×3 IMPLANT
BLADE SURG SZ10 CARB STEEL (BLADE) IMPLANT
CLIP TI LARGE 6 (CLIP) IMPLANT
COVER MAYO STAND STRL (DRAPES) ×3 IMPLANT
DRAIN CHANNEL 19F RND (DRAIN) ×3 IMPLANT
DRAPE LAPAROSCOPIC ABDOMINAL (DRAPES) ×3 IMPLANT
DRAPE SHEET LG 3/4 BI-LAMINATE (DRAPES) IMPLANT
DRAPE WARM FLUID 44X44 (DRAPE) ×6 IMPLANT
DRSG OPSITE POSTOP 3X4 (GAUZE/BANDAGES/DRESSINGS) ×3 IMPLANT
DRSG OPSITE POSTOP 4X10 (GAUZE/BANDAGES/DRESSINGS) ×3 IMPLANT
ELECT REM PT RETURN 9FT ADLT (ELECTROSURGICAL) ×3
ELECTRODE REM PT RTRN 9FT ADLT (ELECTROSURGICAL) ×1 IMPLANT
EVACUATOR DRAINAGE 10X20 100CC (DRAIN) IMPLANT
EVACUATOR SILICONE 100CC (DRAIN) ×3 IMPLANT
GAUZE SPONGE 4X4 12PLY STRL (GAUZE/BANDAGES/DRESSINGS) ×3 IMPLANT
GLOVE BIO SURGEON STRL SZ 6.5 (GLOVE) ×4 IMPLANT
GLOVE BIO SURGEONS STRL SZ 6.5 (GLOVE) ×2
GLOVE BIOGEL PI IND STRL 6.5 (GLOVE) ×2 IMPLANT
GLOVE BIOGEL PI IND STRL 7.0 (GLOVE) ×4 IMPLANT
GLOVE BIOGEL PI IND STRL 8 (GLOVE) ×1 IMPLANT
GLOVE BIOGEL PI INDICATOR 6.5 (GLOVE) ×4
GLOVE BIOGEL PI INDICATOR 7.0 (GLOVE) ×8
GLOVE BIOGEL PI INDICATOR 8 (GLOVE) ×2
GLOVE ECLIPSE 8.0 STRL XLNG CF (GLOVE) ×6 IMPLANT
GLOVE SURG ORTHO 8.0 STRL STRW (GLOVE) ×6 IMPLANT
GLOVE SURG SS PI 6.5 STRL IVOR (GLOVE) ×6 IMPLANT
GLOVE SURG SS PI 7.0 STRL IVOR (GLOVE) ×6 IMPLANT
GOWN STRL REUS W/TWL LRG LVL3 (GOWN DISPOSABLE) ×3 IMPLANT
GOWN STRL REUS W/TWL XL LVL3 (GOWN DISPOSABLE) ×15 IMPLANT
KIT BASIN OR (CUSTOM PROCEDURE TRAY) ×3 IMPLANT
LEGGING LITHOTOMY PAIR STRL (DRAPES) IMPLANT
NS IRRIG 1000ML POUR BTL (IV SOLUTION) ×6 IMPLANT
PACK GENERAL/GYN (CUSTOM PROCEDURE TRAY) ×3 IMPLANT
RELOAD PROXIMATE 75MM BLUE (ENDOMECHANICALS) ×3 IMPLANT
SHEARS HARMONIC ACE PLUS 36CM (ENDOMECHANICALS) IMPLANT
SPONGE DRAIN TRACH 4X4 STRL 2S (GAUZE/BANDAGES/DRESSINGS) ×3 IMPLANT
STAPLER GUN LINEAR PROX 60 (STAPLE) ×3 IMPLANT
STAPLER PROXIMATE 75MM BLUE (STAPLE) ×3 IMPLANT
STAPLER VISISTAT 35W (STAPLE) ×3 IMPLANT
SUCTION POOLE TIP (SUCTIONS) ×3 IMPLANT
SUT ETHILON 3 0 PS 1 (SUTURE) IMPLANT
SUT NOV 1 T60/GS (SUTURE) IMPLANT
SUT NOVA NAB DX-16 0-1 5-0 T12 (SUTURE) ×12 IMPLANT
SUT NOVA T20/GS 25 (SUTURE) IMPLANT
SUT SILK 2 0 (SUTURE) ×4
SUT SILK 2 0 SH CR/8 (SUTURE) ×3 IMPLANT
SUT SILK 2 0SH CR/8 30 (SUTURE) IMPLANT
SUT SILK 2-0 18XBRD TIE 12 (SUTURE) ×2 IMPLANT
SUT SILK 2-0 30XBRD TIE 12 (SUTURE) IMPLANT
SUT SILK 3 0 (SUTURE) ×4
SUT SILK 3 0 SH CR/8 (SUTURE) ×3 IMPLANT
SUT SILK 3-0 18XBRD TIE 12 (SUTURE) ×2 IMPLANT
SUT VIC AB 3-0 SH 18 (SUTURE) ×3 IMPLANT
SUT VIC AB 4-0 SH 18 (SUTURE) IMPLANT
TAPE CLOTH SURG 4X10 WHT LF (GAUZE/BANDAGES/DRESSINGS) ×3 IMPLANT
TOWEL OR 17X26 10 PK STRL BLUE (TOWEL DISPOSABLE) ×6 IMPLANT
TRAY FOLEY CATH 14FRSI W/METER (CATHETERS) ×3 IMPLANT
YANKAUER SUCT BULB TIP NO VENT (SUCTIONS) ×3 IMPLANT

## 2014-03-23 NOTE — Anesthesia Postprocedure Evaluation (Signed)
Anesthesia Post Note  Patient: [name]  Procedure(s) Performed: Procedure(s) (LRB): ILEOSTOMY CLOSURE (N/A) HERNIA REPAIR PARASTOMAL (N/A)  Anesthesia type: GA  Patient location: PACU  Post pain: Pain level controlled  Post assessment: Post-op Vital signs reviewed  Last Vitals:  Filed Vitals:   03/23/14 1000  BP: 145/61  Pulse: 78  Temp:   Resp: 10    Post vital signs: Reviewed  Level of consciousness: sedated  Complications: No apparent anesthesia complications

## 2014-03-23 NOTE — Anesthesia Preprocedure Evaluation (Signed)
Anesthesia Evaluation  Patient identified by MRN, date of birth, ID band Patient awake    Reviewed: Allergy & Precautions, H&P , Patient's Chart, lab work & pertinent test results, reviewed documented beta blocker date and time   History of Anesthesia Complications Negative for: history of anesthetic complications  Airway Mallampati: II  TM Distance: >3 FB Neck ROM: full    Dental   Pulmonary  breath sounds clear to auscultation        Cardiovascular Exercise Tolerance: Good hypertension, Rhythm:regular Rate:Normal     Neuro/Psych    GI/Hepatic   Endo/Other    Renal/GU      Musculoskeletal  (+) Arthritis -,   Abdominal   Peds  Hematology   Anesthesia Other Findings   Reproductive/Obstetrics                             Anesthesia Physical Anesthesia Plan  ASA: II  Anesthesia Plan: General ETT   Post-op Pain Management:    Induction:   Airway Management Planned:   Additional Equipment:   Intra-op Plan:   Post-operative Plan:   Informed Consent: I have reviewed the patients History and Physical, chart, labs and discussed the procedure including the risks, benefits and alternatives for the proposed anesthesia with the patient or authorized representative who has indicated his/her understanding and acceptance.   Dental Advisory Given  Plan Discussed with: CRNA and Surgeon  Anesthesia Plan Comments:         Anesthesia Quick Evaluation

## 2014-03-23 NOTE — Progress Notes (Signed)
Quick Note:  These results are acceptable for scheduled surgery.  Donnie Gedeon M. Lihanna Biever, MD, FACS [facility] Office: [phone]   ______ 

## 2014-03-23 NOTE — Anesthesia Procedure Notes (Signed)
Procedure Name: Intubation Date/Time: 03/23/2014 7:38 AM Performed by: Johnathan Hausen A Pre-anesthesia Checklist: Patient identified, Emergency Drugs available, Suction available, Patient being monitored and Timeout performed Patient Re-evaluated:Patient Re-evaluated prior to inductionOxygen Delivery Method: Circle system utilized Preoxygenation: Pre-oxygenation with 100% oxygen Intubation Type: Combination inhalational/ intravenous induction Ventilation: Mask ventilation without difficulty Laryngoscope Size: Mac and 3 Grade View: Grade I Tube type: Oral Number of attempts: 1 Airway Equipment and Method: Stylet Placement Confirmation: ETT inserted through vocal cords under direct vision,  breath sounds checked- equal and bilateral and positive ETCO2 Secured at: 21 cm Tube secured with: Tape Dental Injury: Teeth and Oropharynx as per pre-operative assessment

## 2014-03-23 NOTE — Interval H&P Note (Signed)
History and Physical Interval Note:  03/23/2014 7:05 AM  [name]  has presented today for surgery, with the diagnosis of colon cancer, parastomal hernia.  The various methods of treatment have been discussed with the patient and family. After consideration of risks, benefits and other options for treatment, the patient has consented to    Procedure(s): ILEOSTOMY CLOSURE (N/A) HERNIA REPAIR PARASTOMAL (N/A) as a surgical intervention .    The patient's history has been reviewed, patient examined, no change in status, stable for surgery.  I have reviewed the patient's chart and labs.  Questions were answered to the patient's satisfaction.    Earnstine Regal, MD, [facility] Office: [place]

## 2014-03-23 NOTE — Op Note (Signed)
NAMECHRISTIANN, [name] NO.:  [id number]  MEDICAL RECORD NO.:  [id number]  LOCATION:  58                         FACILITY:  [facility]  PHYSICIAN:  Crystal Regal, MD      DATE OF BIRTH:  [birth year]  DATE OF PROCEDURE:  03/23/2014                              OPERATIVE REPORT   PREOPERATIVE DIAGNOSIS:  Personal history of colon cancer, presence of ileostomy, parastomal hernia.  POSTOPERATIVE DIAGNOSIS:  Personal history of colon cancer, presence of ileostomy, parastomal hernia.  PROCEDURE: 1. Exploratory laparotomy with lysis of adhesions. 2. Takedown of ileostomy with ileocolonic anastomosis. 3. Primary repair of parastomal hernia.  SURGEON:  Crystal Regal, MD, FACS  ASSISTANT:  Crystal Hollingshead, MD, FACS  ANESTHESIA:  General per Dr. Rudean [name] and Crystal Helper, CRNA  ESTIMATED BLOOD LOSS:  Minimal.  PREPARATION:  Betadine.  COMPLICATIONS:  None.  INDICATIONS:  The patient is an 81 year old female who underwent right colectomy and end ileostomy for an obstructing adenocarcinoma of the colon in May 2015.  She received adjuvant chemotherapy.  She has developed a moderate size parastomal hernia.  She desires ileostomy closure.  BODY OF REPORT:  Procedure was done in OR #3 at the [facility].  The patient was brought to the operating room, placed in supine position on the operating room table.  Following administration of general anesthesia, the patient was prepped and draped in the usual aseptic fashion.  After ascertaining that an adequate level of anesthesia had been achieved, the patient's previous midline incision was reopened with a #10 blade.  Dissection was carried through subcutaneous tissues.  Suture material was extracted from the midline fascia and the fascia was incised and the peritoneal cavity was entered cautiously.  Adhesions of the omentum to the anterior abdominal wall were taken down with electrocautery and sharp  dissection.  The hernia was reduced out of a relatively large hernia sac in the subcutaneous tissues.  Fascial defect measures approximately 6 cm in diameter.  The adhesions of the small bowel to the hernia sac were taken down sharply.  The ileostomy was mobilized up to the level of the skin. Skin was incised in an elliptical fashion around the ileostomy and the ileostomy was completely freed from the skin and delivered back within the peritoneal cavity.  The mesentery to the ileostomy is divided between [name] clamps, ligated with 2-0 silk ties.  The bowel was transected approximately 6 cm proximal to the ileostomy with a [name] stapler.  Ileostomy was submitted to Pathology for review.  A few interloop adhesions were lysed.  The proximal transverse colon was identified.  It contains inspissated stool.  This was propelled distally.  Next a side-to-side functionally end-to-end anastomosis was created between the terminal ileum and the proximal transverse colon. This was performed with a [name] stapler.  Staple line was inspected for hemostasis.  Enterotomy was closed with a [name] stapler.  Mesenteric defect was closed with interrupted 2-0 silk sutures. Anastomosis was widely patent.  Abdomen was irrigated copiously with warm saline which was evacuated. Good hemostasis was noted.  The parastomal hernia in the right mid abdominal wall was then closed in a single layer  with interrupted #1 [name] simple sutures.  The omentum was used to cover the bowel and the anastomosis.  Midline incision was closed with interrupted #1 [name] simple sutures.  Skin was closed with stainless steel staples.  The hernia sac was cauterized at multiple points with the electrocautery.  A 19-French [name] drain was brought in from an inferior stab wound and secured to the skin with a 3-0 nylon suture.  Drain was placed in the subcutaneous space at the site of the previous parastomal hernia sac. The skin was then  closed with interrupted 3-0 [name] sutures followed by stainless steel staples.  Drain was placed to bulb suction.  Honeycomb dressings were placed to both wounds.  An abdominal binder was placed around the abdomen. The patient was awakened from anesthesia and brought to the recovery room.  The patient tolerated the procedure well.   Crystal Regal, MD, [facility] Office: [phone]    TMG/MEDQ  D:  03/23/2014  T:  03/23/2014  Job:  [id number]

## 2014-03-23 NOTE — Brief Op Note (Signed)
03/23/2014  9:35 AM  PATIENT:  [name]  81 y.o. female  PRE-OPERATIVE DIAGNOSIS:  colon cancer, parastomal hernia  POST-OPERATIVE DIAGNOSIS:  colon cancer, parastomal hernia  PROCEDURE:  Procedure(s): ILEOSTOMY CLOSURE (N/A) HERNIA REPAIR PARASTOMAL (N/A)  SURGEON:  Surgeon(s) and Role:    * Armandina Gemma, MD - Primary    * Jackolyn Confer, MD - Assisting  ANESTHESIA:   general  EBL:  Total I/O In: 1000 [I.V.:1000] Out: 100 [Urine:100]  BLOOD ADMINISTERED:none  DRAINS: (1) Blake drain(s) in the SQ space at ileostomy site   LOCAL MEDICATIONS USED:  NONE  SPECIMEN:  Excision  DISPOSITION OF SPECIMEN:  PATHOLOGY  COUNTS:  YES  TOURNIQUET:  * No tourniquets in log *  DICTATION: .Other Dictation: Dictation Number [id number]  PLAN OF CARE: Admit to inpatient   PATIENT DISPOSITION:  PACU - hemodynamically stable.   Delay start of Pharmacological VTE agent (>24hrs) due to surgical blood loss or risk of bleeding: yes  Crystal Regal, MD, [facility] Office: [phone]

## 2014-03-23 NOTE — Transfer of Care (Signed)
Immediate Anesthesia Transfer of Care Note  Patient: [name]  Procedure(s) Performed: Procedure(s): ILEOSTOMY CLOSURE (N/A) HERNIA REPAIR PARASTOMAL (N/A)  Patient Location: PACU  Anesthesia Type:General  Level of Consciousness: awake, sedated and patient cooperative  Airway & Oxygen Therapy: Patient Spontanous Breathing and Patient connected to face mask oxygen  Post-op Assessment: Report given to PACU RN and Post -op Vital signs reviewed and stable  Post vital signs: Reviewed and stable  Complications: No apparent anesthesia complications

## 2014-03-24 ENCOUNTER — Encounter (HOSPITAL_COMMUNITY): Payer: Self-pay | Admitting: Surgery

## 2014-03-24 LAB — CBC
HCT: 34.3 % — ABNORMAL LOW (ref 36.0–46.0)
HEMOGLOBIN: 11.6 g/dL — AB (ref 12.0–15.0)
MCH: 32.8 pg (ref 26.0–34.0)
MCHC: 33.8 g/dL (ref 30.0–36.0)
MCV: 96.9 fL (ref 78.0–100.0)
PLATELETS: 247 10*3/uL (ref 150–400)
RBC: 3.54 MIL/uL — AB (ref 3.87–5.11)
RDW: 12.9 % (ref 11.5–15.5)
WBC: 16.3 10*3/uL — ABNORMAL HIGH (ref 4.0–10.5)

## 2014-03-24 LAB — BASIC METABOLIC PANEL
Anion gap: 5 (ref 5–15)
BUN: 15 mg/dL (ref 6–23)
CO2: 25 mmol/L (ref 19–32)
CREATININE: 0.78 mg/dL (ref 0.50–1.10)
Calcium: 8.8 mg/dL (ref 8.4–10.5)
Chloride: 106 mEq/L (ref 96–112)
GFR calc Af Amer: 85 mL/min — ABNORMAL LOW (ref >90)
GFR calc non Af Amer: 73 mL/min — ABNORMAL LOW (ref >90)
Glucose, Bld: 168 mg/dL — ABNORMAL HIGH (ref 70–99)
Potassium: 4.7 mmol/L (ref 3.5–5.1)
Sodium: 136 mmol/L (ref 135–145)

## 2014-03-24 NOTE — Plan of Care (Signed)
Problem: Phase I Progression Outcomes Goal: Voiding-avoid urinary catheter unless indicated Outcome: Completed/Met Date Met:  03/24/14 Pt voided 366m 5 hours after foley was d/c'd.

## 2014-03-24 NOTE — Care Management Note (Signed)
    Page 1 of 1   03/24/2014     12:07:31 PM CARE MANAGEMENT NOTE 03/24/2014  Patient:  [name], [name]   Account Number:  [id number]  Date Initiated:  03/24/2014  Documentation initiated by:  Sunday Spillers  Subjective/Objective Assessment:   81 yo female admitted s/p ileostomy takedown and parastomal hernia repair. PTA lived at home alone.     Action/Plan:   Home when stable   Anticipated DC Date:  03/27/2014   Anticipated DC Plan:  [place]  CM consult      Choice offered to / List presented to:             Status of service:  Completed, signed off Medicare Important Message given?   (If response is "NO", the following Medicare IM given date fields will be blank) Date Medicare IM given:   Medicare IM given by:   Date Additional Medicare IM given:   Additional Medicare IM given by:    Discharge Disposition:  HOME/SELF CARE  Per UR Regulation:  Reviewed for med. necessity/level of care/duration of stay  If discussed at [place] of Stay Meetings, dates discussed:    Comments:

## 2014-03-24 NOTE — Progress Notes (Signed)
Patient ID: [name], female   DOB: September 24, 1924, 81 y.o.   MRN: [id number]  General Surgery - [facility] - Progress Note  POD# 1  Subjective: Patient with minimal pain, using PCA occasionally.  No nausea or emesis.  Objective: Vital signs in last 24 hours: Temp:  [97.6 F (36.4 C)-99.1 F (37.3 C)] 99 F (37.2 C) (01/15 0653) Pulse Rate:  [75-93] 86 (01/15 0653) Resp:  [10-20] 20 (01/15 0653) BP: (107-158)/(48-68) 118/68 mmHg (01/15 0653) SpO2:  [95 %-100 %] 96 % (01/15 0653) Last BM Date: 03/22/14  Intake/Output from previous day: 01/14 0701 - 01/15 0700 In: 3740 [P.O.:240; I.V.:3500] Out: 4709 [GGEZM:6294; Drains:10]  Exam: HEENT - clear, not icteric Neck - soft Chest - clear bilaterally Cor - RRR, no murmur Abd - soft, mild distension; BS present; wounds dry and intact; JP with serosanguinous output, small Ext - no significant edema Neuro - grossly intact, no focal deficits  Lab Results:   Recent Labs  03/23/14 1140 03/24/14 0522  WBC 14.1* 16.3*  HGB 12.6 11.6*  HCT 37.5 34.3*  PLT 270 247     Recent Labs  03/21/14 1135 03/23/14 1140 03/24/14 0522  NA 139  --  136  K 5.3*  --  4.7  CL 105  --  106  CO2 27  --  25  GLUCOSE 113*  --  168*  BUN 16  --  15  CREATININE 0.71 1.05 0.78  CALCIUM 10.0  --  8.8    Studies/Results: No results found.  Assessment / Plan: 1.  Status post ileostomy takedown and parastomal hernia repair  Begin clear liquid diet  Out of bed to chair, ambulate  Foley out this AM  Binder when out of bed  Earnstine Regal, MD, [facility] Office: [phone]  03/24/2014

## 2014-03-25 LAB — BASIC METABOLIC PANEL
ANION GAP: 8 (ref 5–15)
BUN: 15 mg/dL (ref 6–23)
CALCIUM: 8.8 mg/dL (ref 8.4–10.5)
CO2: 25 mmol/L (ref 19–32)
CREATININE: 0.64 mg/dL (ref 0.50–1.10)
Chloride: 106 mEq/L (ref 96–112)
GFR, EST NON AFRICAN AMERICAN: 78 mL/min — AB (ref >90)
GLUCOSE: 123 mg/dL — AB (ref 70–99)
Potassium: 3.9 mmol/L (ref 3.5–5.1)
SODIUM: 139 mmol/L (ref 135–145)

## 2014-03-25 NOTE — Progress Notes (Signed)
Patient ID: [name], female   DOB: [birth year], 81 y.o.   MRN: [id number] [facility] Progress Note:   2 Days Post-Op  Subjective: Mental status is clear.  Asking a lot of questions.  Taking clears but no flatus Objective: Vital signs in last 24 hours: Temp:  [98.9 F (37.2 C)-99 F (37.2 C)] 99 F (37.2 C) (01/16 0510) Pulse Rate:  [66-70] 66 (01/16 0510) Resp:  [9-18] 13 (01/16 0748) BP: (111-144)/(53-77) 144/77 mmHg (01/16 0510) SpO2:  [95 %-99 %] 95 % (01/16 0748)  Intake/Output from previous day: 01/15 0701 - 01/16 0700 In: 2147.5 [P.O.:320; I.V.:1827.5] Out: 740 [Urine:700; Drains:40] Intake/Output this shift:    Physical Exam: Work of breathing is normal.  JP serosanguinous and scant.  Incisions dressed.  Abdomen flat and nontender.    Lab Results:  Results for orders placed or performed during the hospital encounter of 03/23/14 (from the past 48 hour(s))  CBC     Status: Abnormal   Collection Time: 03/23/14 11:40 AM  Result Value Ref Range   WBC 14.1 (H) 4.0 - 10.5 K/uL   RBC 3.87 3.87 - 5.11 MIL/uL   Hemoglobin 12.6 12.0 - 15.0 g/dL   HCT 37.5 36.0 - 46.0 %   MCV 96.9 78.0 - 100.0 fL   MCH 32.6 26.0 - 34.0 pg   MCHC 33.6 30.0 - 36.0 g/dL   RDW 13.1 11.5 - 15.5 %   Platelets 270 150 - 400 K/uL  Creatinine, serum     Status: Abnormal   Collection Time: 03/23/14 11:40 AM  Result Value Ref Range   Creatinine, Ser 1.05 0.50 - 1.10 mg/dL   GFR calc non Af Amer 46 (L) >90 mL/min   GFR calc Af Amer 54 (L) >90 mL/min    Comment: (NOTE) The eGFR has been calculated using the CKD EPI equation. This calculation has not been validated in all clinical situations. eGFR's persistently <90 mL/min signify possible Chronic Kidney Disease.   Basic metabolic panel     Status: Abnormal   Collection Time: 03/24/14  5:22 AM  Result Value Ref Range   Sodium 136 135 - 145 mmol/L    Comment: Please note change in reference range.   Potassium 4.7 3.5 - 5.1  mmol/L    Comment: Please note change in reference range.   Chloride 106 96 - 112 mEq/L   CO2 25 19 - 32 mmol/L   Glucose, Bld 168 (H) 70 - 99 mg/dL   BUN 15 6 - 23 mg/dL   Creatinine, Ser 0.78 0.50 - 1.10 mg/dL   Calcium 8.8 8.4 - 10.5 mg/dL   GFR calc non Af Amer 73 (L) >90 mL/min   GFR calc Af Amer 85 (L) >90 mL/min    Comment: (NOTE) The eGFR has been calculated using the CKD EPI equation. This calculation has not been validated in all clinical situations. eGFR's persistently <90 mL/min signify possible Chronic Kidney Disease.    Anion gap 5 5 - 15  CBC     Status: Abnormal   Collection Time: 03/24/14  5:22 AM  Result Value Ref Range   WBC 16.3 (H) 4.0 - 10.5 K/uL   RBC 3.54 (L) 3.87 - 5.11 MIL/uL   Hemoglobin 11.6 (L) 12.0 - 15.0 g/dL   HCT 34.3 (L) 36.0 - 46.0 %   MCV 96.9 78.0 - 100.0 fL   MCH 32.8 26.0 - 34.0 pg   MCHC 33.8 30.0 - 36.0 g/dL   RDW 12.9  11.5 - 15.5 %   Platelets 247 150 - 400 K/uL  Basic metabolic panel     Status: Abnormal   Collection Time: 03/25/14  5:40 AM  Result Value Ref Range   Sodium 139 135 - 145 mmol/L    Comment: Please note change in reference range.   Potassium 3.9 3.5 - 5.1 mmol/L    Comment: Please note change in reference range. RESULT REPEATED AND VERIFIED DELTA CHECK NOTED    Chloride 106 96 - 112 mEq/L   CO2 25 19 - 32 mmol/L   Glucose, Bld 123 (H) 70 - 99 mg/dL   BUN 15 6 - 23 mg/dL   Creatinine, Ser 0.64 0.50 - 1.10 mg/dL   Calcium 8.8 8.4 - 10.5 mg/dL   GFR calc non Af Amer 78 (L) >90 mL/min   GFR calc Af Amer >90 >90 mL/min    Comment: (NOTE) The eGFR has been calculated using the CKD EPI equation. This calculation has not been validated in all clinical situations. eGFR's persistently <90 mL/min signify possible Chronic Kidney Disease.    Anion gap 8 5 - 15    Radiology/Results: No results found.  Anti-infectives: Anti-infectives    Start     Dose/Rate Route Frequency Ordered Stop   03/23/14 0535  cefOXitin  (MEFOXIN) 2 g in dextrose 5 % 50 mL IVPB     2 g100 mL/hr over 30 Minutes Intravenous On call to O.R. 03/23/14 0535 03/23/14 0720      Assessment/Plan: Problem List: Patient Active Problem List   Diagnosis Date Noted  . Ileostomy present 03/23/2014  . Ileostomy in place 07/14/2013  . Other and unspecified hyperlipidemia 07/13/2013  . Nausea alone 07/10/2013  . Colonic obstruction 07/09/2013  . 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 HTN (hypertension) 07/06/2013    Doing well.  Stay on clears until flatus.  2 Days Post-Op    LOS: 2 days   Matt B. Hassell Done, MD, [facility] [phone] beeper [phone]  03/25/2014 9:56 AM

## 2014-03-26 MED ORDER — MORPHINE SULFATE 2 MG/ML IJ SOLN: 1.0000 mg | INTRAMUSCULAR | Status: DC | PRN

## 2014-03-26 MED ORDER — HYDROCODONE-ACETAMINOPHEN 5-325 MG PO TABS
1.0000 | ORAL_TABLET | ORAL | Status: DC | PRN
  Administered 2014-03-28: 1 via ORAL
  Filled 2014-03-26: qty 1

## 2014-03-26 NOTE — Progress Notes (Signed)
Patient ID: [name], female   DOB: [birth year], 81 y.o.   MRN: [id number] [facility] Progress Note:   3 Days Post-Op  Subjective: Mental status is alert, not taking PCA;  hungry Objective: Vital signs in last 24 hours: Temp:  [98.2 F (36.8 C)-99.5 F (37.5 C)] 98.7 F (37.1 C) (01/17 0615) Pulse Rate:  [68-75] 75 (01/17 0615) Resp:  [12-19] 18 (01/17 0840) BP: (145-166)/(62-74) 149/74 mmHg (01/17 0946) SpO2:  [94 %-98 %] 96 % (01/17 0840)  Intake/Output from previous day: 01/16 0701 - 01/17 0700 In: 2852.5 [P.O.:1080; I.V.:1772.5] Out: 2222 [Urine:2200; Drains:22] Intake/Output this shift:    Physical Exam: Work of breathing is normal.  Abdomen is appropriately sore  Lab Results:  Results for orders placed or performed during the hospital encounter of 03/23/14 (from the past 48 hour(s))  Basic metabolic panel     Status: Abnormal   Collection Time: 03/25/14  5:40 AM  Result Value Ref Range   Sodium 139 135 - 145 mmol/L    Comment: Please note change in reference range.   Potassium 3.9 3.5 - 5.1 mmol/L    Comment: Please note change in reference range. RESULT REPEATED AND VERIFIED DELTA CHECK NOTED    Chloride 106 96 - 112 mEq/L   CO2 25 19 - 32 mmol/L   Glucose, Bld 123 (H) 70 - 99 mg/dL   BUN 15 6 - 23 mg/dL   Creatinine, Ser 0.64 0.50 - 1.10 mg/dL   Calcium 8.8 8.4 - 10.5 mg/dL   GFR calc non Af Amer 78 (L) >90 mL/min   GFR calc Af Amer >90 >90 mL/min    Comment: (NOTE) The eGFR has been calculated using the CKD EPI equation. This calculation has not been validated in all clinical situations. eGFR's persistently <90 mL/min signify possible Chronic Kidney Disease.    Anion gap 8 5 - 15    Radiology/Results: No results found.  Anti-infectives: Anti-infectives    Start     Dose/Rate Route Frequency Ordered Stop   03/23/14 0535  cefOXitin (MEFOXIN) 2 g in dextrose 5 % 50 mL IVPB     2 g100 mL/hr over 30 Minutes Intravenous On call to O.R.  03/23/14 0535 03/23/14 0720      Assessment/Plan: Problem List: Patient Active Problem List   Diagnosis Date Noted  . Ileostomy present 03/23/2014  . Ileostomy in place 07/14/2013  . Other and unspecified hyperlipidemia 07/13/2013  . Nausea alone 07/10/2013  . Colonic obstruction 07/09/2013  . 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 HTN (hypertension) 07/06/2013    Will advance to full liquids; discontinue PCA.   3 Days Post-Op    LOS: 3 days   Matt B. Hassell Done, MD, [facility] [phone] beeper [phone]  03/26/2014 9:57 AM

## 2014-03-27 MED ORDER — ACETAMINOPHEN 325 MG PO TABS: 650.0000 mg | ORAL_TABLET | ORAL | Status: DC | PRN

## 2014-03-27 NOTE — Progress Notes (Signed)
Patient ID: Anselm Lis, female   DOB: [birth year], 81 y.o.   MRN: [id number]  [facility] - Progress Note  POD# 4  Subjective: Patient comfortable, no complaints.  Tolerating full liquids.  Passing flatus, no BM.  Entereg causing crampy abdominal pain.  Objective: Vital signs in last 24 hours: Temp:  [98 F (36.7 C)-98.4 F (36.9 C)] 98.2 F (36.8 C) (01/18 0520) Pulse Rate:  [77-82] 82 (01/18 0520) Resp:  [18] 18 (01/18 0520) BP: (128-155)/(63-80) 139/80 mmHg (01/18 0520) SpO2:  [96 %-100 %] 100 % (01/18 0520) Last BM Date: 03/22/14  Intake/Output from previous day: 01/17 0701 - 01/18 0700 In: 2040 [P.O.:240; I.V.:1800] Out: 715 [Urine:700; Drains:15]  Exam: HEENT - clear, not icteric Neck - soft Chest - clear bilaterally Cor - RRR, no murmur Abd - soft, mild distension; BS present; wounds clear and dry and intact; JP with small serosanguinous Ext - no significant edema Neuro - grossly intact, no focal deficits  Lab Results:  No results for input(s): WBC, HGB, HCT, PLT in the last 72 hours.   Recent Labs  03/25/14 0540  NA 139  K 3.9  CL 106  CO2 25  GLUCOSE 123*  BUN 15  CREATININE 0.64  CALCIUM 8.8    Studies/Results: No results found.  Assessment / Plan: 1.  Status post ileostomy closure and repair parastomal hernia  Advance to regular diet  PO meds  Ambulate in halls  Likely home next 1-2 days  Stop Entereg now  Earnstine Regal, MD, [facility] Office: [phone]  03/27/2014

## 2014-03-28 NOTE — Progress Notes (Signed)
Patient ID: [name], female   DOB: 1924/12/23, 81 y.o.   MRN: [id number]  [facility] - Progress Note  POD# 5  Subjective: Patient pleasant, no pain.  Tolerating regular diet.  4-5 BM's overnight.  Ambulating with walker.  Objective: Vital signs in last 24 hours: Temp:  [97.4 F (36.3 C)-99.4 F (37.4 C)] 98.7 F (37.1 C) (01/19 0554) Pulse Rate:  [76-93] 76 (01/19 0554) Resp:  [18] 18 (01/19 0554) BP: (112-119)/(60-66) 119/66 mmHg (01/19 0554) SpO2:  [96 %-97 %] 96 % (01/19 0554) Last BM Date: 03/27/14  Intake/Output from previous day: 01/18 0701 - 01/19 0700 In: 2384 [P.O.:2160; I.V.:224] Out: 1190 [Urine:1150; Drains:40]  Exam: HEENT - clear, not icteric Neck - soft Chest - clear bilaterally Cor - RRR, no murmur Abd - soft without distension; dressings removed; JP with serous output Ext - no significant edema Neuro - grossly intact, no focal deficits  Lab Results:  No results for input(s): WBC, HGB, HCT, PLT in the last 72 hours.  No results for input(s): NA, K, CL, CO2, GLUCOSE, BUN, CREATININE, CALCIUM in the last 72 hours.  Studies/Results: No results found.  Assessment / Plan: 1.  Status post ileostomy closure and repair parastomal hernia  Regular diet  Bowel function returning - frequent stools  OOB, ambulate in halls today  Anticipate discharge home in AM 1/20  Will remove drain in AM 1/20 prior to discharge  May shower  Earnstine Regal, MD, [facility] Office: [phone]  03/28/2014

## 2014-03-29 MED ORDER — HYDROCODONE-ACETAMINOPHEN 5-325 MG PO TABS: 1.0000 | ORAL_TABLET | ORAL | Status: DC | PRN

## 2014-03-29 NOTE — Progress Notes (Signed)
Patient is sleeping peacefully. No c/o pain/discomfort offered at present time. Will continue to monitor.  Esperanza Heir, RN

## 2014-03-29 NOTE — Progress Notes (Signed)
Discharge instructions given. Family at bedside. No concerns voiced at discharge. Family to stay with patient at night for the first couple of nights. NO other concerns voiced.

## 2014-03-29 NOTE — Discharge Summary (Signed)
Physician Discharge Summary [facility]  Patient ID: [name] MRN: [id number] DOB/AGE: 1924/06/08 81 y.o.  Admit date: 03/23/2014 Discharge date: 03/29/2014  Admission Diagnoses:  Ileostomy, parastomal hernia  Discharge Diagnoses:  Principal Problem:   Ileostomy in place Active Problems:   Ileostomy present   Discharged Condition: good  Hospital Course: patient admitted for takedown of ileostomy and repair of parastomal hernia.  Procedure uncomplicated.  Post op course straightforward with resolution of ileus and advancement of diet.  Tolerating regular diet.  Ambulatory.  Prepared for discharge on POD#6.  Consults: None  Treatments: surgery: takedown ileostomy, primary repair of parastomal hernia  Discharge Exam: Blood pressure 122/68, pulse 84, temperature 98.3 F (36.8 C), temperature source Oral, resp. rate 18, height 5\' 4"  (1.626 m), weight 146 lb (66.225 kg), SpO2 98 %. HEENT - clear Neck - soft Chest - clear bilaterally Cor - RRR Abd - wounds dry and intact; JP drain removed; BS present; soft without distension  Disposition: Home  Discharge Instructions    Diet - low sodium heart healthy    Complete by:  As directed      Discharge instructions    Complete by:  As directed   [facility]  OPEN ABDOMINAL SURGERY: POST OP INSTRUCTIONS  Always review your discharge instruction sheet given to you by the facility where your surgery was performed.  A prescription for pain medication may be given to you upon discharge.  Take your pain medication as prescribed.  If narcotic pain medicine is not needed, then you may take acetaminophen (Tylenol) or ibuprofen (Advil) as needed. Take your usually prescribed medications unless otherwise directed. If you need a refill on your pain medication, please contact your pharmacy. They will contact our office to request authorization.  Prescriptions will not be filled after 5 pm or on  weekends. You should follow a light diet the first few days after arrival home, such as soup and crackers, unless your doctor has advised otherwise. A high-fiber, low fat diet can be resumed as tolerated.  Be sure to include plenty of fluids daily.  Most patients will experience some swelling and bruising in the area of the incision. Ice packs will help. Swelling and bruising can take several days to resolve. It is common to experience some constipation if taking pain medication after surgery.  Increasing fluid intake and taking a stool softener will usually help or prevent this problem from occurring.  A mild laxative (Milk of Magnesia or Miralax) should be taken according to package directions if there are no bowel movements after 48 hours.  You may have steri-strips (small skin tapes) in place directly over the incision.  These strips should be left on the skin for 7-10 days.  If your surgeon used skin glue on the incision, you may shower in 24 hours.  The glue will flake off over the next 2-3 weeks.  Any sutures or staples will be removed at the office during your follow-up visit. You may find that a light gauze bandage over your incision may keep your staples from being rubbed or pulled. You may shower and replace the bandage daily. ACTIVITIES:  You may resume regular (light) daily activities beginning the next day-such as daily self-care, walking, climbing stairs-gradually increasing activities as tolerated.  You may have sexual intercourse when it is comfortable.  Refrain from any heavy lifting or straining until approved by your doctor.  You may drive when you no longer are taking prescription  pain medication, you can comfortably wear a seatbelt, and you can safely maneuver your car and apply brakes. You should see your doctor in the office for a follow-up appointment approximately two weeks after your surgery.  Make sure that you call for this appointment within a day or two after you arrive home to  insure a convenient appointment time.  WHEN TO CALL YOUR DOCTOR: Fever greater than 101.0 Inability to urinate Persistent nausea and/or vomiting Extreme swelling or bruising Continued bleeding from incision Increased pain, redness, or drainage from the incision Difficulty swallowing or breathing Muscle cramping or spasms Numbness or tingling in hands or around lips  IF YOU HAVE DISABILITY OR FAMILY LEAVE FORMS, YOU MUST BRING THEM TO THE OFFICE FOR PROCESSING.  PLEASE DO NOT GIVE THEM TO YOUR DOCTOR.  The clinic staff is available to answer your questions during regular business hours.  Please don't hesitate to call and ask to speak to one of the nurses if you have concerns.  [facility] [place] Office: [phone]  For further questions, please visit [email or web address]     Increase activity slowly    Complete by:  As directed      No dressing needed    Complete by:  As directed             Medication List    TAKE these medications        amLODipine 5 MG tablet  Commonly known as:  NORVASC  Take 5 mg by mouth daily.     aspirin EC 81 MG tablet  Take 81 mg by mouth daily.     atorvastatin 10 MG tablet  Commonly known as:  LIPITOR  Take 10 mg by mouth daily.     capecitabine 500 MG tablet  Commonly known as:  XELODA  Take #2 (1000 mg ) in morning and #1 (500 mg ) in pm for 14 days on and 7 days off.-start 01/17/14     HYDROcodone-acetaminophen 5-325 MG per tablet  Commonly known as:  NORCO/VICODIN  Take 1-2 tablets by mouth every 4 (four) hours as needed for moderate pain.     loperamide 2 MG capsule  Commonly known as:  IMODIUM  Take 1 tablet 3 times a day and an extra tablet up to 3 times day as needed for greater than 400 cc of ostomy output and a 4 hour period.     losartan-hydrochlorothiazide 100-25 MG per tablet  Commonly known as:  HYZAAR  Take 1 tablet by mouth daily.     prochlorperazine 5 MG tablet  Commonly known as:   COMPAZINE  Take 1 tablet (5 mg total) by mouth every 6 (six) hours as needed for nausea or vomiting.         Earnstine Regal, MD, [facility] Office: [phone]   Signed: Earnstine Regal 03/29/2014, 7:40 AM

## 2014-03-29 NOTE — Evaluation (Signed)
Physical Therapy Evaluation Patient Details Name: [name] MRN: [id number] DOB: April 20, 1924 Today's Date: 03/29/2014   History of Present Illness  Pt is an 81 year old female admitted for takedown of ileostomy and repair of parastomal hernia.  Clinical Impression  Patient evaluated by Physical Therapy with no further acute PT needs identified. All education has been completed and the patient has no further questions.  RN asked PT to evaluate pt prior to d/c for any anticipated needs.  Pt steady with ambulation using RW and agreeable to use upon d/c.  Pt reports no concerns returning home alone to regular daily routine.  No follow-up Physical Therapy or equipment needs identified at this time. PT is signing off. Thank you for this referral.     Follow Up Recommendations No PT follow up    Equipment Recommendations  None recommended by PT    Recommendations for Other Services       Precautions / Restrictions Precautions Precautions: None      Mobility  Bed Mobility               General bed mobility comments: sitting EOB on arrival, pt able to verbalize log roll technique for abdominal comfort  Transfers Overall transfer level: Modified independent                  Ambulation/Gait Ambulation/Gait assistance: Supervision;Modified independent (Device/Increase time) Ambulation Distance (Feet): 200 Feet Assistive device: Rolling walker (2 wheeled) Gait Pattern/deviations: WFL(Within Functional Limits)     General Gait Details: slower pace however steady, agreeable to use RW upon d/c until feeling better   Stairs            Wheelchair Mobility    Modified Rankin (Stroke Patients Only)       Balance Overall balance assessment: No apparent balance deficits (not formally assessed) (denies hx of falls)                                           Pertinent Vitals/Pain Pain Assessment: No/denies pain    Home Living  Family/patient expects to be discharged to:: Private residence Living Arrangements: Alone Available Help at Discharge: Neighbor;Available PRN/intermittently Type of Home: House Home Access: Level entry     Home Layout: One level Home Equipment: Walker - 2 wheels      Prior Function Level of Independence: Independent         Comments: has used RW for past abdominal surgeries     Hand Dominance        Extremity/Trunk Assessment               Lower Extremity Assessment: Overall WFL for tasks assessed         Communication   Communication: No difficulties  Cognition Arousal/Alertness: Awake/alert Behavior During Therapy: WFL for tasks assessed/performed Overall Cognitive Status: Within Functional Limits for tasks assessed                      General Comments      Exercises        Assessment/Plan    PT Assessment Patent does not need any further PT services  PT Diagnosis     PT Problem List    PT Treatment Interventions     PT Goals (Current goals can be found in the Care Plan section) Acute Rehab PT Goals PT Goal  Formulation: All assessment and education complete, DC therapy    Frequency     Barriers to discharge        Co-evaluation               End of Session   Activity Tolerance: Patient tolerated treatment well Patient left: with call bell/phone within reach;with family/visitor present;in bed           Time: 1324-4010 PT Time Calculation (min) (ACUTE ONLY): 8 min   Charges:   PT Evaluation $Initial PT Evaluation Tier I: 1 Procedure     PT G Codes:        Jet Armbrust,KATHrine E 03/29/2014, 1:04 PM Carmelia Bake, PT, DPT 03/29/2014 Pager: [phone]

## 2014-05-18 ENCOUNTER — Other Ambulatory Visit (HOSPITAL_BASED_OUTPATIENT_CLINIC_OR_DEPARTMENT_OTHER): Payer: Medicare Other

## 2014-05-18 ENCOUNTER — Ambulatory Visit (HOSPITAL_BASED_OUTPATIENT_CLINIC_OR_DEPARTMENT_OTHER): Payer: Medicare Other | Admitting: Oncology

## 2014-05-18 ENCOUNTER — Telehealth: Payer: Self-pay | Admitting: Oncology

## 2014-05-18 VITALS — BP 143/72 | HR 88 | Temp 98.6°F | Resp 18 | Ht 64.0 in | Wt 145.4 lb

## 2014-05-18 DIAGNOSIS — C182 Malignant neoplasm of ascending colon: Secondary | ICD-10-CM

## 2014-05-18 DIAGNOSIS — Z85038 Personal history of other malignant neoplasm of large intestine: Secondary | ICD-10-CM

## 2014-05-18 DIAGNOSIS — R918 Other nonspecific abnormal finding of lung field: Secondary | ICD-10-CM

## 2014-05-18 LAB — CBC WITH DIFFERENTIAL/PLATELET
BASO%: 0.5 % (ref 0.0–2.0)
BASOS ABS: 0 10*3/uL (ref 0.0–0.1)
EOS%: 6.9 % (ref 0.0–7.0)
Eosinophils Absolute: 0.5 10*3/uL (ref 0.0–0.5)
HCT: 33.7 % — ABNORMAL LOW (ref 34.8–46.6)
HGB: 11.3 g/dL — ABNORMAL LOW (ref 11.6–15.9)
LYMPH%: 29.3 % (ref 14.0–49.7)
MCH: 30.9 pg (ref 25.1–34.0)
MCHC: 33.5 g/dL (ref 31.5–36.0)
MCV: 92.1 fL (ref 79.5–101.0)
MONO#: 0.6 10*3/uL (ref 0.1–0.9)
MONO%: 9.8 % (ref 0.0–14.0)
NEUT%: 53.5 % (ref 38.4–76.8)
NEUTROS ABS: 3.5 10*3/uL (ref 1.5–6.5)
Platelets: 255 10*3/uL (ref 145–400)
RBC: 3.66 10*6/uL — ABNORMAL LOW (ref 3.70–5.45)
RDW: 12.2 % (ref 11.2–14.5)
WBC: 6.5 10*3/uL (ref 3.9–10.3)
lymph#: 1.9 10*3/uL (ref 0.9–3.3)

## 2014-05-18 NOTE — Telephone Encounter (Signed)
Pt confirmed labs/ov per 03/10 POF, gave pt AVS..... KJ °

## 2014-05-18 NOTE — Progress Notes (Signed)
  [place] OFFICE PROGRESS NOTE   Diagnosis: Colon cancer  INTERVAL HISTORY:   [name] returns as scheduled. She underwent an ileostomy takedown procedure on 03/23/2014. She feels well. Her bowels are functioning. The skin rash is much improved. No complaint.  Objective:  Vital signs in last 24 hours:  Blood pressure 143/72, pulse 88, temperature 98.6 F (37 C), temperature source Oral, resp. rate 18, height 5\' 4"  (1.626 m), weight 145 lb 6.4 oz (65.953 kg), SpO2 98 %.    HEENT: Neck without mass Lymphatics: No cervical, supra-clavicular, axillary, or inguinal nodes Resp: Lungs clear bilaterally Cardio: Regular rate and rhythm GI: No hepatosplenomegaly, nontender, no mass Vascular: No leg edema  Skin: Mild yeast rash surrounding the previous ileostomy site     Lab Results:  Lab Results  Component Value Date   WBC 6.5 05/18/2014   HGB 11.3* 05/18/2014   HCT 33.7* 05/18/2014   MCV 92.1 05/18/2014   PLT 255 05/18/2014   NEUTROABS 3.5 05/18/2014      Lab Results  Component Value Date   CEA 1.2 01/11/2014    Medications: I have reviewed the patient's current medications.  Assessment/Plan: 1. Stage IIIB (T3 N2a) poorly different it adenocarcinoma of the right colon, status post a right colectomy and end ileostomy 07/09/2013.  4 of 15 lymph nodes positive for metastatic carcinoma and 2 tumor deposits.   Cycle 1 adjuvant Xeloda 08/15/2013.   Cycle 2 adjuvant Xeloda 09/05/2013.   Cycle 3 adjuvant Xeloda with a dose reduction 10/03/2013.   Cycle 4 adjuvant Xeloda 10/25/2013.   Cycle 5 adjuvant Xeloda 11/16/2013.  Cycle 6 adjuvant Xeloda 12/06/2013  Cycle 7 of adjuvant Xeloda 12/27/2013  Cycle 8 of adjuvant Xeloda 01/17/2014.  Restaging CT scans 02/14/2014 with stable 4 to 5 mm right lower lobe pulmonary nodule; interval enlargement of left periaortic lymphadenopathy.  Ileostomy takedown 03/23/2014 2. History of multiple  nonmelanoma skin cancers. 3. History of hypertension. 4. Indeterminate 4 mm right lung base nodule on the CT abdomen 07/06/2013.  Chest CT 08/05/2013 with middle and right lower lobe nodules measuring up to 4 mm. Repeat chest CT planned at a 6-9 month interval.  Restaging chest CT 02/14/2014 with stable 4-5 mm right lower lobe pulmonary nodule. 5. Rash secondary to Xeloda. Resolved 6. 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    Disposition:  [name] is in clinical remission from colon cancer. We will follow-up on the CEA from today. She will be scheduled for a restaging CT to follow up on lung nodules and abdominal lymph nodes in 3 months.  Crystal Coder, MD  05/18/2014  11:56 AM

## 2014-05-19 LAB — CEA: CEA: 2.8 ng/mL (ref 0.0–5.0)

## 2014-05-22 ENCOUNTER — Telehealth: Payer: Self-pay | Admitting: *Deleted

## 2014-05-22 NOTE — Telephone Encounter (Signed)
-----   Message from Ladell Pier, MD sent at 05/19/2014  5:03 PM EST ----- Please call patient, cea is normal, repeat with next lab

## 2014-05-22 NOTE — Telephone Encounter (Signed)
Per Dr. Benay Spice; notified pt  That cea is normal and will check again next appt.  Pt verbalized understanding and confirmed appt for 09/04/14.

## 2014-06-05 ENCOUNTER — Other Ambulatory Visit (HOSPITAL_COMMUNITY): Payer: Self-pay

## 2014-09-04 ENCOUNTER — Other Ambulatory Visit: Payer: Self-pay | Admitting: Oncology

## 2014-09-04 ENCOUNTER — Ambulatory Visit (HOSPITAL_COMMUNITY)
Admission: RE | Admit: 2014-09-04 | Discharge: 2014-09-04 | Disposition: A | Discharge status: Home / Self Care | Payer: Medicare Other | Source: Ambulatory Visit | Attending: Oncology | Admitting: Oncology

## 2014-09-04 ENCOUNTER — Other Ambulatory Visit (HOSPITAL_BASED_OUTPATIENT_CLINIC_OR_DEPARTMENT_OTHER): Payer: Medicare Other

## 2014-09-04 ENCOUNTER — Encounter: Payer: Medicare Other | Admitting: Nurse Practitioner

## 2014-09-04 ENCOUNTER — Telehealth: Payer: Self-pay | Admitting: Nurse Practitioner

## 2014-09-04 DIAGNOSIS — C182 Malignant neoplasm of ascending colon: Secondary | ICD-10-CM

## 2014-09-04 DIAGNOSIS — Z9049 Acquired absence of other specified parts of digestive tract: Secondary | ICD-10-CM | POA: Diagnosis not present

## 2014-09-04 DIAGNOSIS — Z08 Encounter for follow-up examination after completed treatment for malignant neoplasm: Secondary | ICD-10-CM | POA: Diagnosis not present

## 2014-09-04 DIAGNOSIS — R918 Other nonspecific abnormal finding of lung field: Secondary | ICD-10-CM | POA: Insufficient documentation

## 2014-09-04 DIAGNOSIS — I2510 Atherosclerotic heart disease of native coronary artery without angina pectoris: Secondary | ICD-10-CM | POA: Insufficient documentation

## 2014-09-04 LAB — BASIC METABOLIC PANEL (CC13)
ANION GAP: 9 meq/L (ref 3–11)
BUN: 15.8 mg/dL (ref 7.0–26.0)
CO2: 25 mEq/L (ref 22–29)
Calcium: 9.3 mg/dL (ref 8.4–10.4)
Chloride: 107 mEq/L (ref 98–109)
Creatinine: 0.9 mg/dL (ref 0.6–1.1)
EGFR: 61 mL/min/{1.73_m2} — ABNORMAL LOW (ref >90)
GLUCOSE: 140 mg/dL (ref 70–140)
Potassium: 3.4 mEq/L — ABNORMAL LOW (ref 3.5–5.1)
SODIUM: 141 meq/L (ref 136–145)

## 2014-09-04 LAB — CBC WITH DIFFERENTIAL/PLATELET
BASO%: 0.6 % (ref 0.0–2.0)
BASOS ABS: 0 10*3/uL (ref 0.0–0.1)
EOS%: 3.4 % (ref 0.0–7.0)
Eosinophils Absolute: 0.2 10*3/uL (ref 0.0–0.5)
HCT: 33.9 % — ABNORMAL LOW (ref 34.8–46.6)
HGB: 11.1 g/dL — ABNORMAL LOW (ref 11.6–15.9)
LYMPH%: 21.3 % (ref 14.0–49.7)
MCH: 27.7 pg (ref 25.1–34.0)
MCHC: 32.7 g/dL (ref 31.5–36.0)
MCV: 84.9 fL (ref 79.5–101.0)
MONO#: 0.5 10*3/uL (ref 0.1–0.9)
MONO%: 8 % (ref 0.0–14.0)
NEUT#: 4.2 10*3/uL (ref 1.5–6.5)
NEUT%: 66.7 % (ref 38.4–76.8)
Platelets: 283 10*3/uL (ref 145–400)
RBC: 3.99 10*6/uL (ref 3.70–5.45)
RDW: 13.5 % (ref 11.2–14.5)
WBC: 6.3 10*3/uL (ref 3.9–10.3)
lymph#: 1.3 10*3/uL (ref 0.9–3.3)

## 2014-09-04 MED ORDER — IOHEXOL 300 MG/ML  SOLN
50.0000 mL | Freq: Once | INTRAMUSCULAR | Status: AC | PRN
  Administered 2014-09-04: 50 mL via ORAL

## 2014-09-04 MED ORDER — IOHEXOL 300 MG/ML  SOLN
100.0000 mL | Freq: Once | INTRAMUSCULAR | Status: DC | PRN
Start: 2014-09-04 — End: 2014-09-04

## 2014-09-04 NOTE — Telephone Encounter (Signed)
Lft msg for pt confirming office visit per 06/27 POF, called primary # cell but vm was full, left msg on home phone... KJ

## 2014-09-07 ENCOUNTER — Telehealth: Payer: Self-pay | Admitting: Nurse Practitioner

## 2014-09-07 ENCOUNTER — Ambulatory Visit (HOSPITAL_BASED_OUTPATIENT_CLINIC_OR_DEPARTMENT_OTHER): Payer: Medicare Other | Admitting: Nurse Practitioner

## 2014-09-07 VITALS — BP 156/67 | HR 97 | Temp 98.5°F | Resp 18 | Ht 64.0 in | Wt 147.3 lb

## 2014-09-07 DIAGNOSIS — Z85038 Personal history of other malignant neoplasm of large intestine: Secondary | ICD-10-CM | POA: Diagnosis not present

## 2014-09-07 DIAGNOSIS — R918 Other nonspecific abnormal finding of lung field: Secondary | ICD-10-CM | POA: Diagnosis not present

## 2014-09-07 DIAGNOSIS — C182 Malignant neoplasm of ascending colon: Secondary | ICD-10-CM

## 2014-09-07 NOTE — Telephone Encounter (Signed)
per pof to sch pt appt-gave pt copy of avs °

## 2014-09-07 NOTE — Patient Instructions (Signed)
Call Dr Amedeo Plenty to discuss indication/timing of colonoscopy---[phone]

## 2014-09-07 NOTE — Progress Notes (Addendum)
  [place] OFFICE PROGRESS NOTE   Diagnosis: Colon cancer   INTERVAL HISTORY:   [name] returns as scheduled. She feels well. She has a good appetite. No nausea or vomiting. Bowels moving regularly. No bloody or black stools. No abdominal pain.  Objective:  Vital signs in last 24 hours:  Blood pressure 156/67, pulse 97, temperature 98.5 F (36.9 C), temperature source Oral, resp. rate 18, height 5\' 4"  (1.626 m), weight 147 lb 4.8 oz (66.815 kg), SpO2 99 %.    HEENT: No thrush or ulcers. Lymphatics: No palpable cervical, supraclavicular, axillary or inguinal lymph nodes. Resp: Lungs clear bilaterally. Cardio: Regular rate and rhythm. GI: Abdomen soft and nontender. No mass. No organomegaly. Vascular: No leg edema.   Lab Results:  Lab Results  Component Value Date   WBC 6.3 09/04/2014   HGB 11.1* 09/04/2014   HCT 33.9* 09/04/2014   MCV 84.9 09/04/2014   PLT 283 09/04/2014   NEUTROABS 4.2 09/04/2014    Imaging:  No results found.  Medications: I have reviewed the patient's current medications.  Assessment/Plan: 1. Stage IIIB (T3 N2a) poorly different it adenocarcinoma of the right colon, status post a right colectomy and end ileostomy 07/09/2013.  4 of 15 lymph nodes positive for metastatic carcinoma and 2 tumor deposits.   Cycle 1 adjuvant Xeloda 08/15/2013.   Cycle 2 adjuvant Xeloda 09/05/2013.   Cycle 3 adjuvant Xeloda with a dose reduction 10/03/2013.   Cycle 4 adjuvant Xeloda 10/25/2013.   Cycle 5 adjuvant Xeloda 11/16/2013.  Cycle 6 adjuvant Xeloda 12/06/2013  Cycle 7 of adjuvant Xeloda 12/27/2013  Cycle 8 of adjuvant Xeloda 01/17/2014.  Restaging CT scans 02/14/2014 with stable 4 to 5 mm right lower lobe pulmonary nodule; interval enlargement of left periaortic lymphadenopathy.  Ileostomy takedown 03/23/2014  Restaging CT scan 09/04/2014 with stable small pulmonary nodules. Stable and smaller mesenteric and  retroperitoneal lymph nodes. No evidence of metastatic disease. 2. History of multiple nonmelanoma skin cancers. 3. History of hypertension. 4. Indeterminate 4 mm right lung base nodule on the CT abdomen 07/06/2013.  Chest CT 08/05/2013 with middle and right lower lobe nodules measuring up to 4 mm. Repeat chest CT planned at a 6-9 month interval.  Restaging chest CT 02/14/2014 with stable 4-5 mm right lower lobe pulmonary nodule. 5. Rash secondary to Xeloda. Resolved 6. 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 Resolved.   Disposition: [name] appears stable. The recent restaging CT scan showed stable lung nodules and stable and smaller mesenteric and retroperitoneal lymph nodes. She will return for a CEA in 3 months. We will see her in follow-up in 6 months.   She will contact Dr. Amedeo Plenty regarding the indication for a colonoscopy.  Patient seen with Dr. Benay Spice.    Ned Card ANP/GNP-BC   09/07/2014  11:29 AM  This was shared visit. The restaging CTs show no evidence of recurrent Colon Cancer.  We will repeat a cbc in 3 months.  Leroy Sea Sherrill,MD

## 2014-09-12 NOTE — Progress Notes (Signed)
Appointment canceled on 09/04/2014 due to CT scan not being completed prior to the visit time. Appointment was rescheduled to 09/07/2014. Please see progress note from that day.

## 2014-12-08 ENCOUNTER — Other Ambulatory Visit (HOSPITAL_BASED_OUTPATIENT_CLINIC_OR_DEPARTMENT_OTHER): Payer: Medicare Other

## 2014-12-08 DIAGNOSIS — Z85038 Personal history of other malignant neoplasm of large intestine: Secondary | ICD-10-CM | POA: Diagnosis not present

## 2014-12-08 DIAGNOSIS — C182 Malignant neoplasm of ascending colon: Secondary | ICD-10-CM | POA: Diagnosis not present

## 2014-12-08 LAB — CBC WITH DIFFERENTIAL/PLATELET
BASO%: 0.4 % (ref 0.0–2.0)
Basophils Absolute: 0 10*3/uL (ref 0.0–0.1)
EOS%: 6.1 % (ref 0.0–7.0)
Eosinophils Absolute: 0.3 10*3/uL (ref 0.0–0.5)
HCT: 31.3 % — ABNORMAL LOW (ref 34.8–46.6)
HEMOGLOBIN: 9.9 g/dL — AB (ref 11.6–15.9)
LYMPH#: 1.4 10*3/uL (ref 0.9–3.3)
LYMPH%: 25.7 % (ref 14.0–49.7)
MCH: 26.4 pg (ref 25.1–34.0)
MCHC: 31.6 g/dL (ref 31.5–36.0)
MCV: 83.5 fL (ref 79.5–101.0)
MONO#: 0.4 10*3/uL (ref 0.1–0.9)
MONO%: 6.3 % (ref 0.0–14.0)
NEUT%: 61.5 % (ref 38.4–76.8)
NEUTROS ABS: 3.4 10*3/uL (ref 1.5–6.5)
Platelets: 316 10*3/uL (ref 145–400)
RBC: 3.75 10*6/uL (ref 3.70–5.45)
RDW: 13.5 % (ref 11.2–14.5)
WBC: 5.6 10*3/uL (ref 3.9–10.3)

## 2014-12-08 LAB — FERRITIN CHCC: FERRITIN: 13 ng/mL (ref 9–269)

## 2014-12-09 LAB — CEA: CEA: 4.8 ng/mL (ref 0.0–5.0)

## 2014-12-12 ENCOUNTER — Telehealth: Payer: Self-pay | Admitting: Nurse Practitioner

## 2014-12-12 ENCOUNTER — Other Ambulatory Visit: Payer: Self-pay | Admitting: Nurse Practitioner

## 2014-12-12 DIAGNOSIS — C182 Malignant neoplasm of ascending colon: Secondary | ICD-10-CM

## 2014-12-12 NOTE — Telephone Encounter (Signed)
As per [name] called [name] informed her that her hemoglobin was lower and we need to check her stool hemoccults stated to her that she will need to  come to the [facility], to the lab and pick up a stool kit. If it is positive for blood will refer her to Dr. Amedeo Plenty a GI Doctor. Also informed her after she returns the stool cards she should begin to take Ferrous Sulfate twice a day. Explained to her that she will then come in 1 month for blood work to check her CBC and CEA. Pt. Verbalized understanding.

## 2014-12-12 NOTE — Telephone Encounter (Deleted)
-----  Message from Owens Shark, NP sent at 12/12/2014  3:58 PM EDT ----- Please let her know hemoglobin was lower. Need to check stool hemoccults (she will need to pick up hemoccult kit here). If positive for blood will refer to Dr. Amedeo Plenty, GI.  After completing stool hemoccults begin ferrous sulfate twice a day.  Check cbc 1 month, cea 1 month.   I put orders in for hemoccult, cbc and cea and submitted POF.   Thanks.

## 2014-12-12 NOTE — Telephone Encounter (Signed)
-----  Message from Owens Shark, NP sent at 12/12/2014  3:58 PM EDT ----- Please let her know hemoglobin was lower. Need to check stool hemoccults (she will need to pick up hemoccult kit here). If positive for blood will refer to Dr. Amedeo Plenty, GI.  After completing stool hemoccults begin ferrous sulfate twice a day.  Check cbc 1 month, cea 1 month.   I put orders in for hemoccult, cbc and cea and submitted POF.   Thanks.

## 2014-12-14 ENCOUNTER — Telehealth: Payer: Self-pay | Admitting: *Deleted

## 2014-12-14 NOTE — Telephone Encounter (Signed)
Pt called asking since she ate red meat last night, and directions for fecal occult read to wait 3 days after eating red meat before obtaining sample, should she wait? I advised pt to follow insert instructions; pt voices understanding and knows how to obtain specimen.

## 2014-12-20 ENCOUNTER — Other Ambulatory Visit: Payer: Self-pay | Admitting: Nurse Practitioner

## 2014-12-20 ENCOUNTER — Ambulatory Visit (HOSPITAL_BASED_OUTPATIENT_CLINIC_OR_DEPARTMENT_OTHER): Payer: Medicare Other

## 2014-12-20 DIAGNOSIS — C182 Malignant neoplasm of ascending colon: Secondary | ICD-10-CM

## 2014-12-20 DIAGNOSIS — Z85038 Personal history of other malignant neoplasm of large intestine: Secondary | ICD-10-CM

## 2014-12-20 LAB — FECAL OCCULT BLOOD, GUAIAC: Occult Blood: POSITIVE

## 2014-12-21 ENCOUNTER — Telehealth: Payer: Self-pay | Admitting: Nurse Practitioner

## 2014-12-21 NOTE — Telephone Encounter (Signed)
-----   Message from Owens Shark, NP sent at 12/20/2014  3:09 PM EDT ----- Please let her know stool is positive for occult blood. A referral has been made to Dr. Penelope Coop, gastroenterology.

## 2014-12-21 NOTE — Telephone Encounter (Signed)
As per Owens Shark call placed to [name] to inform her that the stool samples were positive for blood. Informed her that a referral was sent for Dr. Penelope Coop , She stated that she had already saw Dr. Penelope Coop 3 weeks ago and has an appt for a colonoscopy on 01/04/15. Asked her if she could have send [place] the report after the colonoscopy was done. She verbalized understanding.

## 2015-01-08 ENCOUNTER — Telehealth: Payer: Self-pay

## 2015-01-08 NOTE — Telephone Encounter (Signed)
Pt called asking if there is a better formulation of ferrous sulfate that will not upset her stomach. I suggested she talk to pharmacist at [facility] to give her suggestions. She is taking OTC per Dr Gearldine Shown orders.

## 2015-01-10 ENCOUNTER — Telehealth: Payer: Self-pay | Admitting: *Deleted

## 2015-01-10 NOTE — Telephone Encounter (Signed)
Received colonoscopy report from [facility] via fax. Placed on Dr. Gearldine Shown desk for review.

## 2015-01-16 ENCOUNTER — Other Ambulatory Visit (HOSPITAL_BASED_OUTPATIENT_CLINIC_OR_DEPARTMENT_OTHER): Payer: Medicare Other

## 2015-01-16 DIAGNOSIS — C182 Malignant neoplasm of ascending colon: Secondary | ICD-10-CM

## 2015-01-16 DIAGNOSIS — Z85038 Personal history of other malignant neoplasm of large intestine: Secondary | ICD-10-CM

## 2015-01-16 LAB — CBC WITH DIFFERENTIAL/PLATELET
BASO%: 0.7 % (ref 0.0–2.0)
Basophils Absolute: 0 10*3/uL (ref 0.0–0.1)
EOS%: 4.3 % (ref 0.0–7.0)
Eosinophils Absolute: 0.3 10*3/uL (ref 0.0–0.5)
HCT: 32.6 % — ABNORMAL LOW (ref 34.8–46.6)
HEMOGLOBIN: 10.4 g/dL — AB (ref 11.6–15.9)
LYMPH%: 22.7 % (ref 14.0–49.7)
MCH: 25.9 pg (ref 25.1–34.0)
MCHC: 31.7 g/dL (ref 31.5–36.0)
MCV: 81.5 fL (ref 79.5–101.0)
MONO#: 0.6 10*3/uL (ref 0.1–0.9)
MONO%: 9.5 % (ref 0.0–14.0)
NEUT%: 62.8 % (ref 38.4–76.8)
NEUTROS ABS: 4 10*3/uL (ref 1.5–6.5)
Platelets: 337 10*3/uL (ref 145–400)
RBC: 4 10*6/uL (ref 3.70–5.45)
RDW: 15.7 % — AB (ref 11.2–14.5)
WBC: 6.4 10*3/uL (ref 3.9–10.3)
lymph#: 1.5 10*3/uL (ref 0.9–3.3)

## 2015-01-17 LAB — CEA: CEA: 4.3 ng/mL (ref 0.0–5.0)

## 2015-01-26 ENCOUNTER — Telehealth: Payer: Self-pay

## 2015-01-26 NOTE — Telephone Encounter (Signed)
Second fax received from [facility] given to Owens Shark and put in scan box.

## 2015-02-26 ENCOUNTER — Ambulatory Visit (HOSPITAL_BASED_OUTPATIENT_CLINIC_OR_DEPARTMENT_OTHER): Payer: Medicare Other

## 2015-02-26 ENCOUNTER — Ambulatory Visit (HOSPITAL_BASED_OUTPATIENT_CLINIC_OR_DEPARTMENT_OTHER): Payer: Medicare Other | Admitting: Oncology

## 2015-02-26 ENCOUNTER — Other Ambulatory Visit: Payer: Self-pay | Admitting: *Deleted

## 2015-02-26 ENCOUNTER — Telehealth: Payer: Self-pay | Admitting: Oncology

## 2015-02-26 VITALS — BP 137/69 | HR 94 | Temp 98.9°F | Resp 17 | Ht 64.0 in | Wt 140.6 lb

## 2015-02-26 DIAGNOSIS — D509 Iron deficiency anemia, unspecified: Secondary | ICD-10-CM | POA: Diagnosis not present

## 2015-02-26 DIAGNOSIS — C182 Malignant neoplasm of ascending colon: Secondary | ICD-10-CM

## 2015-02-26 DIAGNOSIS — R918 Other nonspecific abnormal finding of lung field: Secondary | ICD-10-CM

## 2015-02-26 DIAGNOSIS — Z85038 Personal history of other malignant neoplasm of large intestine: Secondary | ICD-10-CM

## 2015-02-26 NOTE — Progress Notes (Signed)
[place] OFFICE PROGRESS NOTE   Diagnosis: Colon cancer  INTERVAL HISTORY:   [name] returns as scheduled. The hemoglobin returned low 12/08/2014 and the stool was Hemoccult positive. She was referred to Dr. Penelope Coop and underwent a colonoscopy 01/04/2015. Diverticulosis was noted in the sigmoid colon. The exam was otherwise normal.  She denies bleeding. She reports nausea and constipation when taking iron. She relates weight loss to not eating while on iron therapy. She tried over-the-counter iron and a prescription I am prescribing by Dr. Harrington Challenger. She discontinued iron a few days ago.  She has seen a dermatologist for a rash over the trunk. Objective:  Vital signs in last 24 hours:  Blood pressure 137/69, pulse 94, temperature 98.9 F (37.2 C), temperature source Oral, resp. rate 17, height 5\' 4"  (1.626 m), weight 140 lb 9.6 oz (63.776 kg), SpO2 98 %.    HEENT: Neck without mass Lymphatics: No cervical, supraclavicular, or inguinal nodes. Soft mobile 1/2 cm bilateral axillary nodes. Resp: Lungs clear bilaterally Cardio: Regular rate and rhythm GI: No hepatosplenomegaly, no mass, no apparent ascites Vascular: No leg edema  Skin: Dry erythematous plaques over the abdominal wall, dry erythematous rash over the back   Portacath/PICC-without erythema  Lab Results:  Lab Results  Component Value Date   WBC 6.4 01/16/2015   HGB 10.4* 01/16/2015   HCT 32.6* 01/16/2015   MCV 81.5 01/16/2015   PLT 337 01/16/2015   NEUTROABS 4.0 01/16/2015   02/15/2015 at [facility] medicine-hemoglobin 11.2, platelets 329,000 and white count 6.9, ANC 4.6, MCV 82, MCH 26.6 Serum iron 34, TIBC 461, percent saturation-7   Lab Results  Component Value Date   CEA 4.3 01/16/2015    Medications: I have reviewed the patient's current medications.  Assessment/Plan: 1. Stage IIIB (T3 N2a) poorly different it adenocarcinoma of the right colon, status post a right colectomy and end ileostomy  07/09/2013.  4 of 15 lymph nodes positive for metastatic carcinoma and 2 tumor deposits.   Cycle 1 adjuvant Xeloda 08/15/2013.   Cycle 2 adjuvant Xeloda 09/05/2013.   Cycle 3 adjuvant Xeloda with a dose reduction 10/03/2013.   Cycle 4 adjuvant Xeloda 10/25/2013.   Cycle 5 adjuvant Xeloda 11/16/2013.  Cycle 6 adjuvant Xeloda 12/06/2013  Cycle 7 of adjuvant Xeloda 12/27/2013  Cycle 8 of adjuvant Xeloda 01/17/2014.  Restaging CT scans 02/14/2014 with stable 4 to 5 mm right lower lobe pulmonary nodule; interval enlargement of left periaortic lymphadenopathy.  Ileostomy takedown 03/23/2014  Restaging CT scan 09/04/2014 with stable small pulmonary nodules. Stable and smaller mesenteric and retroperitoneal lymph nodes. No evidence of metastatic disease. 2. History of multiple nonmelanoma skin cancers. 3. History of hypertension. 4. Indeterminate 4 mm right lung base nodule on the CT abdomen 07/06/2013.  Chest CT 08/05/2013 with middle and right lower lobe nodules measuring up to 4 mm. Repeat chest CT planned at a 6-9 month interval.  Restaging chest CT 02/14/2014 with stable 4-5 mm right lower lobe pulmonary nodule. 5. Rash secondary to Xeloda. Resolved 6. 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 Resolved. 7. Iron deficiency anemia-stool Hemoccult positive 12/20/2014, colonoscopy 01/04/2015 negative other than diverticulosis in the sigmoid colon   Disposition:  [name] remains in clinical remission from colon cancer. She was diagnosed with iron deficiency anemia this fall. The stool was Hemoccult positive. The hemoglobin was slightly higher on 02/15/2015. A colonoscopy in October revealed no evidence for a bleeding site. She is scheduled to see Dr. Penelope Coop on 02/27/2015. He may  wish to consider an upper endoscopy, especially if she continues to have GI symptoms while off of iron.  [name] cannot tolerate oral iron. I offered treatment with IV iron and she  declined.  We will follow-up on the CEA from today. She will return for an office and lab visit in 4 months. We are available to see her in the interim as needed.  Betsy Coder, MD  02/26/2015  12:12 PM

## 2015-02-26 NOTE — Telephone Encounter (Signed)
Gave patient avs report and appointments for April. January lab/fu cx - patient aware. New orders for 4 month f/u given today per 12/19 pof (2nd).

## 2015-02-27 ENCOUNTER — Telehealth: Payer: Self-pay | Admitting: *Deleted

## 2015-02-27 DIAGNOSIS — C182 Malignant neoplasm of ascending colon: Secondary | ICD-10-CM

## 2015-02-27 LAB — CEA: CEA: 5.8 ng/mL — ABNORMAL HIGH (ref 0.0–5.0)

## 2015-02-27 NOTE — Telephone Encounter (Signed)
Per Dr. Benay Spice; notified pt that cea is mildly elevated-just above normal range, repeat 1 month and if elevated further will consider scan.  Pt verbalized understanding and will wait to hear from schedulers for lab.

## 2015-02-27 NOTE — Telephone Encounter (Signed)
-----   Message from Ladell Pier, MD sent at 02/27/2015  9:05 AM EST ----- Please call patient, cea is mildly elevated-just above normal range, repeat 1 month and if elevated further will consider CT

## 2015-02-28 ENCOUNTER — Telehealth: Payer: Self-pay | Admitting: Oncology

## 2015-02-28 NOTE — Telephone Encounter (Signed)
S/w pt, gave appt 03/26/15 for lab only @ 10.45am.

## 2015-03-06 ENCOUNTER — Telehealth: Payer: Self-pay | Admitting: Nurse Practitioner

## 2015-03-06 ENCOUNTER — Telehealth: Payer: Self-pay | Admitting: *Deleted

## 2015-03-06 DIAGNOSIS — C182 Malignant neoplasm of ascending colon: Secondary | ICD-10-CM

## 2015-03-06 NOTE — Telephone Encounter (Signed)
Spoke with patient and she is aware of her 125/28 appointment

## 2015-03-06 NOTE — Telephone Encounter (Signed)
Pt called reports "Dr. Benay Spice told me to call if my appetite changed or something...well my appetite seems to be down a little and I have some gas; they can't do an Endoscopy until 1/10 but that seems so far off"  Pt denies fever/vomiting/diarrhea "I do get a little quesy after eating but I'm not going to take any nausea meds; I don't have any anyway"  Reports no blood in stool noted; pt states she did eat a light dinner last night and has had breakfast and lunch.  "I would like to see Dr. Benay Spice if I can"  Per Dr. Benay Spice; notified pt that Selena Lesser, NP could see pt tomorrow or pt could wait after Endo report and lab on 1/16 to see MD after results in.  Pt states "I would like to come in and be seen tomorrow for my peace of mind"  Dr. Benay Spice made aware.  Pt verbalized understanding that schedulers will call with a time for lab/CB.

## 2015-03-07 ENCOUNTER — Ambulatory Visit (HOSPITAL_BASED_OUTPATIENT_CLINIC_OR_DEPARTMENT_OTHER): Payer: Medicare Other | Admitting: Nurse Practitioner

## 2015-03-07 ENCOUNTER — Other Ambulatory Visit (HOSPITAL_BASED_OUTPATIENT_CLINIC_OR_DEPARTMENT_OTHER): Payer: Medicare Other

## 2015-03-07 VITALS — BP 156/76 | HR 92 | Temp 98.2°F | Resp 18 | Ht 64.0 in | Wt 139.8 lb

## 2015-03-07 DIAGNOSIS — R5383 Other fatigue: Secondary | ICD-10-CM

## 2015-03-07 DIAGNOSIS — R110 Nausea: Secondary | ICD-10-CM

## 2015-03-07 DIAGNOSIS — Z85038 Personal history of other malignant neoplasm of large intestine: Secondary | ICD-10-CM | POA: Diagnosis not present

## 2015-03-07 DIAGNOSIS — D509 Iron deficiency anemia, unspecified: Secondary | ICD-10-CM

## 2015-03-07 DIAGNOSIS — C182 Malignant neoplasm of ascending colon: Secondary | ICD-10-CM | POA: Diagnosis not present

## 2015-03-07 DIAGNOSIS — R630 Anorexia: Secondary | ICD-10-CM

## 2015-03-07 LAB — CBC WITH DIFFERENTIAL/PLATELET
BASO%: 0.4 % (ref 0.0–2.0)
Basophils Absolute: 0 10*3/uL (ref 0.0–0.1)
EOS%: 4.2 % (ref 0.0–7.0)
Eosinophils Absolute: 0.3 10*3/uL (ref 0.0–0.5)
HCT: 34.3 % — ABNORMAL LOW (ref 34.8–46.6)
HGB: 11.2 g/dL — ABNORMAL LOW (ref 11.6–15.9)
LYMPH#: 1.3 10*3/uL (ref 0.9–3.3)
LYMPH%: 18.3 % (ref 14.0–49.7)
MCH: 26.3 pg (ref 25.1–34.0)
MCHC: 32.5 g/dL (ref 31.5–36.0)
MCV: 81 fL (ref 79.5–101.0)
MONO#: 0.6 10*3/uL (ref 0.1–0.9)
MONO%: 8.1 % (ref 0.0–14.0)
NEUT#: 5 10*3/uL (ref 1.5–6.5)
NEUT%: 69 % (ref 38.4–76.8)
Platelets: 329 10*3/uL (ref 145–400)
RBC: 4.24 10*6/uL (ref 3.70–5.45)
RDW: 17.2 % — ABNORMAL HIGH (ref 11.2–14.5)
WBC: 7.3 10*3/uL (ref 3.9–10.3)

## 2015-03-07 MED ORDER — ONDANSETRON HCL 4 MG PO TABS: 4.0000 mg | ORAL_TABLET | Freq: Three times a day (TID) | ORAL | Status: DC | PRN

## 2015-03-09 ENCOUNTER — Other Ambulatory Visit: Payer: Medicare Other

## 2015-03-09 ENCOUNTER — Ambulatory Visit: Payer: Medicare Other | Admitting: Oncology

## 2015-03-09 ENCOUNTER — Telehealth: Payer: Self-pay | Admitting: *Deleted

## 2015-03-09 NOTE — Telephone Encounter (Signed)
TC from patient asking about lab results from 03/07/15 specifically a CEA. Reviewed labs and only lab from that day is a CBC.  Pt states she needs an endoscopy but it is not scheduled until 2nd week of January. Pt states she feels queasy but will not take her nausea medication, though will not say why. Pt states she thinks she will go to the ED today so "something can be done about my stomach. I can't wait until January"

## 2015-03-09 NOTE — Telephone Encounter (Signed)
Returned call to pt, instructed her to take antiemetic PRN. Follow up with GI MD as scheduled, per Dr Benay Spice. Pt voiced understanding. She reports she started nausea med today and it helped. Lab appt, plan to recheck CEA reviewed.

## 2015-03-13 ENCOUNTER — Encounter: Payer: Self-pay | Admitting: Nurse Practitioner

## 2015-03-13 DIAGNOSIS — R630 Anorexia: Secondary | ICD-10-CM | POA: Insufficient documentation

## 2015-03-13 DIAGNOSIS — D509 Iron deficiency anemia, unspecified: Secondary | ICD-10-CM | POA: Insufficient documentation

## 2015-03-13 NOTE — Assessment & Plan Note (Signed)
Patient is status post colectomy and ileostomy in 2015; and ileostomy takedown in January 2016.  Also, patient completed Xeloda therapy in late 2015 as well.  She is currently in clinical remission.  Labs obtained today were within normal limits.  CEA obtained on 02/26/2015 was slightly elevated from 4.3 up to 5.8.  The plan is for the patient to return on 03/26/2015 for labs only.  At that time we will obtain a CEA tumor marker.  If CEA continues increased-will consider a restaging CT.

## 2015-03-13 NOTE — Assessment & Plan Note (Signed)
Patient reports some decreased appetite and increased fatigue recently.  She also complains of some occasional queasiness; but refuses to take the antinausea medication she already has at home.  She denies any vomiting, diarrhea, or constipation.  She just denies any recent fevers or chills.  Patient was advised to try her nausea medication to see if it would help.  Also, patient was advised to eat multiple small meals throughout the day.

## 2015-03-13 NOTE — Progress Notes (Signed)
SYMPTOM MANAGEMENT CLINIC   HPI: [name] 82 y.o. female diagnosed with colon cancer.  Patient is status post colectomy and ileostomy; with subsequent ileostomy takedown in January 2016.  Patient is also status post Xeloda oral therapy.  Currently in clinical remission and under observation only.  Patient presents to the [place] today with complaint of decreased appetite and occasional nausea.  She denies any vomiting, diarrhea, or constipation.  She denies any blood in her stools.  She denies any recent fevers or chills.   HPI  ROS  Past Medical History  Diagnosis Date  . Hypertension   . Hyperlipidemia   . Arthritis   . Cancer [facility])     colon    Past Surgical History  Procedure Laterality Date  . Total hip arthroplasty    . Colonoscopy with propofol N/A 07/08/2013    Procedure: COLONOSCOPY WITH PROPOFOL;  Surgeon: Wonda Horner, MD;  Location: [facility];  Service: Endoscopy;  Laterality: N/A;  . Partial colectomy Right 07/09/2013    Procedure: PARTIAL COLECTOMY AND EXCISION OF NEVUS FROM RIGHT ABDOMINAL WALL;  Surgeon: Earnstine Regal, MD;  Location: [facility];  Service: General;  Laterality: Right;  . Colon surgery    . Eye surgery      bilateral cataract with lens implants  . Ileostomy closure N/A 03/23/2014    Procedure: ILEOSTOMY CLOSURE;  Surgeon: Armandina Gemma, MD;  Location: [facility];  Service: General;  Laterality: N/A;  . 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    has No Known Allergies.    Medication List       This list is accurate as of:  03/07/15 11:59 PM.  Always use your most recent med list.               amLODipine 5 MG tablet  Commonly known as:  NORVASC  Take 5 mg by mouth daily.     aspirin EC 81 MG tablet  Take 81 mg by mouth daily.     atorvastatin 10 MG tablet  Commonly known as:  LIPITOR  Take 10 mg by mouth daily.     losartan-hydrochlorothiazide 100-25 MG tablet  Commonly known as:  HYZAAR  Take 1 tablet by mouth daily.     multivitamin tablet  Take 1 tablet by mouth daily.     ondansetron 4 MG tablet  Commonly known as:  ZOFRAN  Take 1 tablet (4 mg total) by mouth every 8 (eight) hours as needed for nausea or vomiting.     prochlorperazine 5 MG tablet  Commonly known as:  COMPAZINE  Take 1 tablet (5 mg total) by mouth every 6 (six) hours as needed for nausea or vomiting.         PHYSICAL EXAMINATION  Oncology Vitals 03/07/2015 02/26/2015  Height 163 cm 163 cm  Weight 63.413 kg 63.776 kg  Weight (lbs) 139 lbs 13 oz 140 lbs 10 oz  BMI (kg/m2) 24 kg/m2 24.13 kg/m2  Temp 98.2 98.9  Pulse 92 94  Resp 18 17  SpO2 100 98  BSA (m2) 1.69 m2 1.7 m2   BP Readings from Last  2 Encounters:  03/07/15 156/76  02/26/15 137/69    Physical Exam  Constitutional: She is oriented to person, place, and time and well-developed, well-nourished, and in no distress.  HENT:  Head: Normocephalic and atraumatic.  Mouth/Throat: Oropharynx is clear and moist.  Eyes: Conjunctivae and EOM are normal. Pupils are equal, round, and reactive to light. Right eye exhibits no discharge. Left eye exhibits no discharge. No scleral icterus.  Neck: Normal range of motion. Neck supple. No JVD present. No tracheal deviation present. No thyromegaly present.  Cardiovascular: Normal rate, regular rhythm, normal heart sounds and intact distal pulses.   Pulmonary/Chest: Effort normal and breath sounds normal. No respiratory distress. She has no wheezes. She has no rales. She exhibits no tenderness.  Abdominal: Soft. Bowel  sounds are normal. She exhibits no distension and no mass. There is no tenderness. There is no rebound and no guarding.  Musculoskeletal: Normal range of motion. She exhibits no edema or tenderness.  Lymphadenopathy:    She has no cervical adenopathy.  Neurological: She is alert and oriented to person, place, and time. Gait normal.  Skin: Skin is warm and dry. No rash noted. No erythema. No pallor.  Psychiatric: Affect normal.  Nursing note and vitals reviewed.   LABORATORY DATA:. Appointment on 03/07/2015  Component Date Value Ref Range Status  . WBC 03/07/2015 7.3  3.9 - 10.3 10e3/uL Final  . NEUT# 03/07/2015 5.0  1.5 - 6.5 10e3/uL Final  . HGB 03/07/2015 11.2* 11.6 - 15.9 g/dL Final  . HCT 03/07/2015 34.3* 34.8 - 46.6 % Final  . Platelets 03/07/2015 329  145 - 400 10e3/uL Final  . MCV 03/07/2015 81.0  79.5 - 101.0 fL Final  . MCH 03/07/2015 26.3  25.1 - 34.0 pg Final  . MCHC 03/07/2015 32.5  31.5 - 36.0 g/dL Final  . RBC 03/07/2015 4.24  3.70 - 5.45 10e6/uL Final  . RDW 03/07/2015 17.2* 11.2 - 14.5 % Final  . lymph# 03/07/2015 1.3  0.9 - 3.3 10e3/uL Final  . MONO# 03/07/2015 0.6  0.1 - 0.9 10e3/uL Final  . Eosinophils Absolute 03/07/2015 0.3  0.0 - 0.5 10e3/uL Final  . Basophils Absolute 03/07/2015 0.0  0.0 - 0.1 10e3/uL Final  . NEUT% 03/07/2015 69.0  38.4 - 76.8 % Final  . LYMPH% 03/07/2015 18.3  14.0 - 49.7 % Final  . MONO% 03/07/2015 8.1  0.0 - 14.0 % Final  . EOS% 03/07/2015 4.2  0.0 - 7.0 % Final  . BASO% 03/07/2015 0.4  0.0 - 2.0 % Final     RADIOGRAPHIC STUDIES: No results found.  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  Also, patient completed Xeloda therapy in late 2015 as well.  She is currently in clinical remission.  Labs obtained today were within normal limits.  CEA obtained on 02/26/2015 was slightly  elevated from 4.3 up to 5.8.  The plan is for the patient to return on 03/26/2015 for labs only.  At that time we will obtain a CEA tumor marker.  If CEA continues increased-will consider a restaging CT.  Anorexia Patient reports some decreased appetite and increased fatigue recently.  She also complains of some occasional queasiness; but refuses to take the antinausea medication she already has at home.  She denies any vomiting, diarrhea, or constipation.  She just denies any recent fevers or chills.  Patient was advised to  try her nausea medication to see if it would help.  Also, patient was advised to eat multiple small meals throughout the day.  Iron deficiency anemia Patient has a history of chronic iron deficiency anemia.  She has been found to have chronic diverticulosis in the past.  She states she is not taking any iron tablet supplements at this time.  She is scheduled for an upper endoscopy per Dr. Penelope Coop , gastroenterologist on 03/20/2015.  Patient stated understanding of all instructions; and was in agreement with this plan of care. The patient knows to call the clinic with any problems, questions or concerns.   This is a shared visit with Dr. Benay Spice today.  Total time spent with patient was 25 minutes;  with greater than 75 percent of that time spent in face to face counseling regarding patient's symptoms,  and coordination of care and follow up.  Disclaimer:This dictation was prepared with Dragon/digital dictation along with [employer]. Any transcriptional errors that result from this process are unintentional.  Drue Second, NP 03/13/2015   This was a shared visit with Drue Second. Ms. Romanski was interviewed and examined. She continues to have vague abdominal symptoms. We will recommend she follow-up with Dr. Penelope Coop is scheduled for the upper endoscopy. We will see her after the endoscopy. We will consider obtaining a CT abdomen/pelvis if the endoscopy is  nondiagnostic.  Julieanne Manson, M.D.

## 2015-03-13 NOTE — Assessment & Plan Note (Signed)
Patient has a history of chronic iron deficiency anemia.  She has been found to have chronic diverticulosis in the past.  She states she is not taking any iron tablet supplements at this time.  She is scheduled for an upper endoscopy per Dr. Penelope Coop , gastroenterologist on 03/20/2015.

## 2015-03-14 ENCOUNTER — Other Ambulatory Visit: Payer: Self-pay | Admitting: *Deleted

## 2015-03-15 ENCOUNTER — Telehealth: Payer: Self-pay | Admitting: Oncology

## 2015-03-15 NOTE — Telephone Encounter (Signed)
Added f/u to 1/16 lab per 1/4. Spoke with re change and new time for lab/LT 1/16 @ 11:45 am.

## 2015-03-22 ENCOUNTER — Telehealth: Payer: Self-pay | Admitting: *Deleted

## 2015-03-22 NOTE — Telephone Encounter (Signed)
Pt called and left message re:  Pt had EGD this past Tuesday with Negative results.  However, pt stated she has no appetite, and has constipation problem.  With further questions, pt stated she had " good breakfast today - eggs, toast, orange juice, and coffee , and can keep everything down ".  Denied nausea/vomiting.  Stated she would drink Ensure if pt did not feel like eating regular foods.   Pt stated " I want to go to ER since I cannot eat ".  Reinforced with pt that she can eat but just not as much as she was used to, instead of not able to eat nor keep anything down. Instructed pt to take laxatives or stool softeners to help with bowel movements.   Pt also stated she has lower back pain - which Dr. Benay Spice was aware.   Pt had seen symptom management provider on 03/07/15 and made NP aware of lower back pain issue. Pt has f/u appt on 03/26/15,  But wanted to know if she could see Dr. Benay Spice sooner.   Informed pt that message will be relayed to Dr. Benay Spice , and desk nurse today.  Pt stated she would really like to talk to Dr. Gearldine Shown nurse today if possible Pt's  Phone     [phone].

## 2015-03-22 NOTE — Telephone Encounter (Signed)
Per Dr. Benay Spice; notified pt that MD aware of symptoms and she needs to monitor for now and keep appt for 1/16.  Pt verbalized understanding.  Emotional support given after in-depth discussion of food/ BM; encouraged pt that she is doing well and to keep eating small meals/drinking plenty of fluid throughout the day  Pt verbalized understanding and says she will be fine until Monday and will call if questions.

## 2015-03-26 ENCOUNTER — Telehealth: Payer: Self-pay | Admitting: Oncology

## 2015-03-26 ENCOUNTER — Ambulatory Visit (HOSPITAL_BASED_OUTPATIENT_CLINIC_OR_DEPARTMENT_OTHER): Payer: Medicare Other

## 2015-03-26 ENCOUNTER — Ambulatory Visit (HOSPITAL_BASED_OUTPATIENT_CLINIC_OR_DEPARTMENT_OTHER): Payer: Medicare Other | Admitting: Nurse Practitioner

## 2015-03-26 ENCOUNTER — Other Ambulatory Visit (HOSPITAL_BASED_OUTPATIENT_CLINIC_OR_DEPARTMENT_OTHER): Payer: Medicare Other

## 2015-03-26 VITALS — BP 139/75 | HR 89 | Temp 98.6°F | Resp 18 | Ht 64.0 in | Wt 136.2 lb

## 2015-03-26 DIAGNOSIS — K5900 Constipation, unspecified: Secondary | ICD-10-CM

## 2015-03-26 DIAGNOSIS — D509 Iron deficiency anemia, unspecified: Secondary | ICD-10-CM

## 2015-03-26 DIAGNOSIS — R630 Anorexia: Secondary | ICD-10-CM

## 2015-03-26 DIAGNOSIS — R110 Nausea: Secondary | ICD-10-CM

## 2015-03-26 DIAGNOSIS — C182 Malignant neoplasm of ascending colon: Secondary | ICD-10-CM

## 2015-03-26 DIAGNOSIS — M549 Dorsalgia, unspecified: Secondary | ICD-10-CM

## 2015-03-26 DIAGNOSIS — Z85038 Personal history of other malignant neoplasm of large intestine: Secondary | ICD-10-CM

## 2015-03-26 DIAGNOSIS — R634 Abnormal weight loss: Secondary | ICD-10-CM

## 2015-03-26 LAB — CBC WITH DIFFERENTIAL/PLATELET
BASO%: 0.2 % (ref 0.0–2.0)
BASOS ABS: 0 10*3/uL (ref 0.0–0.1)
EOS%: 3.4 % (ref 0.0–7.0)
Eosinophils Absolute: 0.3 10*3/uL (ref 0.0–0.5)
HCT: 33 % — ABNORMAL LOW (ref 34.8–46.6)
HEMOGLOBIN: 10.9 g/dL — AB (ref 11.6–15.9)
LYMPH%: 16.9 % (ref 14.0–49.7)
MCH: 27.1 pg (ref 25.1–34.0)
MCHC: 33 g/dL (ref 31.5–36.0)
MCV: 82.1 fL (ref 79.5–101.0)
MONO#: 0.6 10*3/uL (ref 0.1–0.9)
MONO%: 7.4 % (ref 0.0–14.0)
NEUT#: 6 10*3/uL (ref 1.5–6.5)
NEUT%: 72.1 % (ref 38.4–76.8)
Platelets: 366 10*3/uL (ref 145–400)
RBC: 4.02 10*6/uL (ref 3.70–5.45)
RDW: 15.5 % — AB (ref 11.2–14.5)
WBC: 8.3 10*3/uL (ref 3.9–10.3)
lymph#: 1.4 10*3/uL (ref 0.9–3.3)

## 2015-03-26 LAB — COMPREHENSIVE METABOLIC PANEL
ALT: <9 U/L (ref 0–55)
AST: 15 U/L (ref 5–34)
Albumin: 3.5 g/dL (ref 3.5–5.0)
Alkaline Phosphatase: 56 U/L (ref 40–150)
Anion Gap: 9 mEq/L (ref 3–11)
BUN: 12.5 mg/dL (ref 7.0–26.0)
CHLORIDE: 97 meq/L — AB (ref 98–109)
CO2: 25 meq/L (ref 22–29)
Calcium: 9.7 mg/dL (ref 8.4–10.4)
Creatinine: 0.8 mg/dL (ref 0.6–1.1)
EGFR: 66 mL/min/{1.73_m2} — AB (ref >90)
Glucose: 107 mg/dl (ref 70–140)
POTASSIUM: 4.4 meq/L (ref 3.5–5.1)
SODIUM: 131 meq/L — AB (ref 136–145)
Total Bilirubin: <0.3 mg/dL (ref 0.20–1.20)
Total Protein: 7.4 g/dL (ref 6.4–8.3)

## 2015-03-26 LAB — FERRITIN: Ferritin: 17 ng/ml (ref 9–269)

## 2015-03-26 MED ORDER — TRAMADOL HCL 50 MG PO TABS: 50.0000 mg | ORAL_TABLET | Freq: Four times a day (QID) | ORAL | Status: DC | PRN

## 2015-03-26 NOTE — Progress Notes (Addendum)
[place] OFFICE PROGRESS NOTE   Diagnosis:  Colon cancer  INTERVAL HISTORY:   [name] returns as scheduled. She reports undergoing a negative upper endoscopy. She continues to have nausea. No vomiting. She is also having left flank/lower back pain. She is taking up to 4 Tylenol tablets a day. She is having some constipation. Her appetite is poor. She is losing weight.  Objective:  Vital signs in last 24 hours:  Blood pressure 139/75, pulse 89, temperature 98.6 F (37 C), temperature source Oral, resp. rate 18, height 5\' 4"  (1.626 m), weight 136 lb 3.2 oz (61.78 kg), SpO2 98 %.    HEENT: No thrush or ulcers. Lymphatics: No palpable cervical, supraclavicular or axillary lymph nodes. Resp: Lungs clear bilaterally. Cardio: Regular rate and rhythm. GI: Soft, distended. No hepatomegaly. No mass. Question ascites. Vascular: No leg edema. Musculoskeletal: Essentially nontender over the left flank/lower back.    Lab Results:  Lab Results  Component Value Date   WBC 7.3 03/07/2015   HGB 11.2* 03/07/2015   HCT 34.3* 03/07/2015   MCV 81.0 03/07/2015   PLT 329 03/07/2015   NEUTROABS 5.0 03/07/2015    Imaging:  No results found.  Medications: I have reviewed the patient's current medications.  Assessment/Plan: 1. Stage IIIB (T3 N2a) poorly different it adenocarcinoma of the right colon, status post a right colectomy and end ileostomy 07/09/2013.  4 of 15 lymph nodes positive for metastatic carcinoma and 2 tumor deposits.   Cycle 1 adjuvant Xeloda 08/15/2013.   Cycle 2 adjuvant Xeloda 09/05/2013.   Cycle 3 adjuvant Xeloda with a dose reduction 10/03/2013.   Cycle 4 adjuvant Xeloda 10/25/2013.   Cycle 5 adjuvant Xeloda 11/16/2013.  Cycle 6 adjuvant Xeloda 12/06/2013  Cycle 7 of adjuvant Xeloda 12/27/2013  Cycle 8 of adjuvant Xeloda 01/17/2014.  Restaging CT scans 02/14/2014 with stable 4 to 5 mm right lower lobe pulmonary nodule; interval  enlargement of left periaortic lymphadenopathy.  Ileostomy takedown 03/23/2014  Restaging CT scan 09/04/2014 with stable small pulmonary nodules. Stable and smaller mesenteric and retroperitoneal lymph nodes. No evidence of metastatic disease. 2. History of multiple nonmelanoma skin cancers. 3. History of hypertension. 4. Indeterminate 4 mm right lung base nodule on the CT abdomen 07/06/2013.  Chest CT 08/05/2013 with middle and right lower lobe nodules measuring up to 4 mm. Repeat chest CT planned at a 6-9 month interval.  Restaging chest CT 02/14/2014 with stable 4-5 mm right lower lobe pulmonary nodule. 5. Rash secondary to Xeloda. Resolved 6. 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 Resolved. 7. Iron deficiency anemia-stool Hemoccult positive 12/20/2014, colonoscopy 01/04/2015 negative other than diverticulosis in the sigmoid colon   Disposition: [name] has persistent nausea, constipation, back pain, anorexia/weight loss. These findings are concerning given her history of colon cancer. We are referring her for CT scans.   For pain we gave her a prescription for tramadol 50 mg every 6 hours as needed. She will continue Zofran for nausea. She will begin Senokot-S 2 tablets twice daily for the constipation.   She will return for a follow-up visit on 03/29/2014 to review the results.  Patient seen with Dr. Benay Spice. 25 minutes were spent face-to-face at today's visit with the majority of that time involved in counseling/coordination of care.  Ned Card ANP/GNP-BC   03/26/2015  12:13 PM  this was a shared visit with Ned Card. Ms. Benda was interviewed and examined. We are concerned she has developed recurrent colon cancer. She will be referred  for an abdomen/pelvis CT.   Julieanne Manson, M.D.

## 2015-03-26 NOTE — Patient Instructions (Signed)
Begin Senokot S 2 tabs twice a day

## 2015-03-26 NOTE — Telephone Encounter (Signed)
Patient sent back to lab and given avs report and appointment for January. [facility] will call re ct - patient aware and will drink water based prep for ct.

## 2015-03-27 LAB — CEA: CEA1: 13.1 ng/mL — AB (ref 0.0–4.7)

## 2015-03-27 LAB — CEA (PARALLEL TESTING): CEA: 6 ng/mL — ABNORMAL HIGH (ref 0.0–5.0)

## 2015-03-30 ENCOUNTER — Telehealth: Payer: Self-pay | Admitting: *Deleted

## 2015-03-30 ENCOUNTER — Ambulatory Visit (HOSPITAL_BASED_OUTPATIENT_CLINIC_OR_DEPARTMENT_OTHER): Payer: Medicare Other

## 2015-03-30 ENCOUNTER — Encounter (HOSPITAL_COMMUNITY): Payer: Self-pay

## 2015-03-30 ENCOUNTER — Ambulatory Visit (HOSPITAL_BASED_OUTPATIENT_CLINIC_OR_DEPARTMENT_OTHER): Payer: Medicare Other | Admitting: Nurse Practitioner

## 2015-03-30 ENCOUNTER — Telehealth: Payer: Self-pay | Admitting: Oncology

## 2015-03-30 ENCOUNTER — Ambulatory Visit: Payer: Medicare Other | Admitting: Nutrition

## 2015-03-30 ENCOUNTER — Ambulatory Visit (HOSPITAL_COMMUNITY)
Admission: RE | Admit: 2015-03-30 | Discharge: 2015-03-30 | Disposition: A | Discharge status: Home / Self Care | Payer: Medicare Other | Source: Ambulatory Visit | Attending: Nurse Practitioner | Admitting: Nurse Practitioner

## 2015-03-30 ENCOUNTER — Other Ambulatory Visit (HOSPITAL_COMMUNITY)
Admission: AD | Admit: 2015-03-30 | Discharge: 2015-03-30 | Disposition: A | Discharge status: Home / Self Care | Payer: Medicare Other | Source: Ambulatory Visit | Attending: Oncology | Admitting: Oncology

## 2015-03-30 ENCOUNTER — Other Ambulatory Visit: Payer: Self-pay | Admitting: Nurse Practitioner

## 2015-03-30 VITALS — BP 128/68 | HR 92 | Temp 98.5°F | Resp 17 | Ht 64.0 in | Wt 135.7 lb

## 2015-03-30 DIAGNOSIS — R590 Localized enlarged lymph nodes: Secondary | ICD-10-CM | POA: Diagnosis not present

## 2015-03-30 DIAGNOSIS — K439 Ventral hernia without obstruction or gangrene: Secondary | ICD-10-CM | POA: Diagnosis not present

## 2015-03-30 DIAGNOSIS — K3189 Other diseases of stomach and duodenum: Secondary | ICD-10-CM | POA: Insufficient documentation

## 2015-03-30 DIAGNOSIS — Z0189 Encounter for other specified special examinations: Secondary | ICD-10-CM | POA: Insufficient documentation

## 2015-03-30 DIAGNOSIS — K828 Other specified diseases of gallbladder: Secondary | ICD-10-CM | POA: Insufficient documentation

## 2015-03-30 DIAGNOSIS — R110 Nausea: Secondary | ICD-10-CM | POA: Insufficient documentation

## 2015-03-30 DIAGNOSIS — K838 Other specified diseases of biliary tract: Secondary | ICD-10-CM | POA: Diagnosis not present

## 2015-03-30 DIAGNOSIS — R630 Anorexia: Secondary | ICD-10-CM

## 2015-03-30 DIAGNOSIS — M549 Dorsalgia, unspecified: Secondary | ICD-10-CM

## 2015-03-30 DIAGNOSIS — Z9221 Personal history of antineoplastic chemotherapy: Secondary | ICD-10-CM | POA: Insufficient documentation

## 2015-03-30 DIAGNOSIS — I2510 Atherosclerotic heart disease of native coronary artery without angina pectoris: Secondary | ICD-10-CM | POA: Diagnosis not present

## 2015-03-30 DIAGNOSIS — C182 Malignant neoplasm of ascending colon: Secondary | ICD-10-CM

## 2015-03-30 DIAGNOSIS — Z85038 Personal history of other malignant neoplasm of large intestine: Secondary | ICD-10-CM | POA: Diagnosis not present

## 2015-03-30 DIAGNOSIS — K859 Acute pancreatitis without necrosis or infection, unspecified: Secondary | ICD-10-CM | POA: Diagnosis not present

## 2015-03-30 LAB — COMPREHENSIVE METABOLIC PANEL
ALBUMIN: 3.6 g/dL (ref 3.5–5.0)
ALK PHOS: 59 U/L (ref 40–150)
ALT: 11 U/L (ref 0–55)
AST: 15 U/L (ref 5–34)
Anion Gap: 9 mEq/L (ref 3–11)
BUN: 13.5 mg/dL (ref 7.0–26.0)
CO2: 27 mEq/L (ref 22–29)
Calcium: 9.8 mg/dL (ref 8.4–10.4)
Chloride: 94 mEq/L — ABNORMAL LOW (ref 98–109)
Creatinine: 0.8 mg/dL (ref 0.6–1.1)
EGFR: 63 mL/min/{1.73_m2} — AB (ref >90)
GLUCOSE: 112 mg/dL (ref 70–140)
POTASSIUM: 3.8 meq/L (ref 3.5–5.1)
SODIUM: 130 meq/L — AB (ref 136–145)
Total Bilirubin: <0.3 mg/dL (ref 0.20–1.20)
Total Protein: 7.6 g/dL (ref 6.4–8.3)

## 2015-03-30 LAB — AMYLASE: Amylase: 66 U/L (ref 28–100)

## 2015-03-30 LAB — LIPASE, BLOOD: Lipase: 46 U/L (ref 11–51)

## 2015-03-30 MED ORDER — IOHEXOL 300 MG/ML  SOLN
100.0000 mL | Freq: Once | INTRAMUSCULAR | Status: AC | PRN
  Administered 2015-03-30: 100 mL via INTRAVENOUS

## 2015-03-30 MED ORDER — IOHEXOL 300 MG/ML  SOLN
50.0000 mL | Freq: Once | INTRAMUSCULAR | Status: AC | PRN
  Administered 2015-03-30: 50 mL via ORAL

## 2015-03-30 NOTE — Progress Notes (Signed)
Provided low-fat full liquid diet fact sheet at MD request. Brief education provided. Provided samples of clear liquid oral nutrition supplements. Patient verbalized understanding.  However, I'm not sure she will comply.

## 2015-03-30 NOTE — Telephone Encounter (Signed)
Patient sent back to lab and given avs report and appointments for February.

## 2015-03-30 NOTE — Progress Notes (Addendum)
[facility] OFFICE PROGRESS NOTE   Diagnosis:  Colon cancer  INTERVAL HISTORY:   [name] returns as scheduled. Appetite continues to be poor. She is eating small amounts. She continues to have nausea. She is not taking nausea medication on a regular basis. She continues to have constipation though did note loose stools following the CT contrast. She has taken 1 dose of Senokot-S. She continues to have back pain mainly in the left flank region. She took a dose of Ultram last night with good relief. No fever.  Objective:  Vital signs in last 24 hours:  Blood pressure 128/68, pulse 92, temperature 98.5 F (36.9 C), temperature source Oral, resp. rate 17, height 5\' 4"  (1.626 m), weight 135 lb 11.2 oz (61.553 kg), SpO2 100 %.    HEENT: No thrush or ulcers. Sclera anicteric. Resp: Lungs clear bilaterally. Cardio: Regular rate and rhythm. GI: Abdomen is distended with mild diffuse tenderness. No hepatomegaly. Vascular: No leg edema.   Lab Results:  Lab Results  Component Value Date   WBC 8.3 03/26/2015   HGB 10.9* 03/26/2015   HCT 33.0* 03/26/2015   MCV 82.1 03/26/2015   PLT 366 03/26/2015   NEUTROABS 6.0 03/26/2015    Imaging:  Ct Chest W Contrast  03/30/2015  CLINICAL DATA:  Stage III ascending colon cancer diagnosed in 2015 status post chemotherapy and colonic resection. Patient presents with nausea and back/left flank pain. EXAM: CT CHEST, ABDOMEN, AND PELVIS WITH CONTRAST TECHNIQUE: Multidetector CT imaging of the chest, abdomen and pelvis was performed following the standard protocol during bolus administration of intravenous contrast. CONTRAST:  140mL OMNIPAQUE IOHEXOL 300 MG/ML  SOLN COMPARISON:  09/04/2014 CT chest, abdomen and pelvis. FINDINGS: CT CHEST Mediastinum/Nodes: Normal heart size. No pericardial fluid/thickening. Coronary atherosclerosis. Great vessels are normal in course and caliber. No central pulmonary emboli. Hypodense 0.3 cm right thyroid  lobe nodule appears decreased in size from 1.0 cm on 09/04/2014. Oral contrast in the lower thoracic esophagus. No pathologically enlarged axillary, mediastinal or hilar lymph nodes. Lungs/Pleura: No pneumothorax. No pleural effusion. Anterior subpleural right middle lobe 3 mm pulmonary nodule (series 5/ image 39) is stable since 08/05/2013. Right lower lobe 4 mm pulmonary nodule (series 5/ image 34) is stable since 08/05/2013. No acute consolidative airspace disease, new significant pulmonary nodules or lung masses. Minimal scarring versus atelectasis at both lung bases. Musculoskeletal: No aggressive appearing focal osseous lesions. Stable severe right glenohumeral joint osteoarthritis with bulky clustered intra-articular osseous bodies in the right shoulder joint space. Mild-to-moderate degenerative changes in the thoracic spine. CT ABDOMEN AND PELVIS Hepatobiliary: There is new mild diffuse intrahepatic biliary ductal dilatation. No liver mass. New prominent gallbladder distention. No definite gallbladder wall thickening. No radiopaque cholelithiasis. New prominently dilated common bile duct (15 mm diameter). No discrete radiopaque choledocholithiasis. Pancreas: There is thickening and heterogeneity of the pancreatic head, with new peripancreatic fat stranding extending into the central mesentery and anterior paranephric retroperitoneal spaces bilaterally, most suggestive of acute pancreatitis. No discrete pancreatic mass. There is new main pancreatic duct dilation (5 mm diameter). Spleen: Normal size spleen containing granulomatous splenic calcifications, with no splenic mass. Adrenals/Urinary Tract: Normal right adrenal. New diffuse left adrenal thickening, with the suggestion of a new 1.2 x 1.1 cm left adrenal hypodense nodule (series 2/ image 57). Parapelvic renal cysts in the bilateral renal sinuses. No hydronephrosis. Limited bladder visualization due to streak artifact from right hip hardware. No gross  bladder abnormality. Stomach/Bowel: Grossly normal stomach. There is wall  thickening throughout the second and third portions of the duodenum with associated peri-duodenal fat stranding and ill-defined fluid. Normal caliber small bowel. Stable postsurgical changes from right hemicolectomy, with intact appearing ileocolic anastomosis, with no wall thickening or mass at the anastomosis to suggest local tumor recurrence. Mild diverticulosis in the sigmoid colon, with no wall thickening or pericolonic fat stranding in the residual colon. Oral contrast progresses to the rectum. Vascular/Lymphatic: Atherosclerotic nonaneurysmal abdominal aorta. Patent portal, splenic, hepatic and renal veins. New mild gastrohepatic ligament, porta hepatis, portacaval and left para-aortic lymphadenopathy. For example a 1.0 cm mildly enlarged gastrohepatic ligament node (series 2/ image 53). There is a 1.0 cm mildly enlarged porta hepatis/ peripancreatic node (series 2/ image 41). There is a mildly enlarged 1.3 cm portacaval node (series 2/image 50). There is a mildly enlarged 1.1 cm left para-aortic node (series 2/ image 64). Reproductive: Grossly normal uterus.  No adnexal mass. Other: No pneumoperitoneum, ascites or focal fluid collection. Musculoskeletal: No aggressive appearing focal osseous lesions. Mild-to-moderate degenerative changes in the lumbar spine. Stable small to moderate ventral lateral right abdominal wall fat containing hernias. Stable tiny supraumbilical fat containing hernia near the midline. IMPRESSION: 1. Thickening and heterogeneity of the pancreatic head with peripancreatic fat stranding extending into the central mesentery and bilateral anterior retroperitoneum, most in keeping with acute pancreatitis. 2. New diffuse biliary ductal dilatation and main pancreatic duct dilatation. Gallbladder distention. No discrete pancreatic mass. Consider further evaluation with MRI abdomen with MRCP with and without intravenous  contrast to evaluate for choledocholithiasis or obstructing mass. 3. Wall thickening with associated surrounding fat stranding throughout the second and third portions of the duodenum, probably representing reactive duodenitis. 4. New upper retroperitoneal lymphadenopathy, nonspecific. 5. New small left adrenal nodule, indeterminate, metastasis not excluded. This could also be evaluated on MRI abdomen. 6. Two tiny pulmonary nodules in the right lung, stable since 08/05/2013, probably benign. 7. No evidence of local tumor recurrence at the ileocolic anastomosis. 8. Numerous chronic findings including coronary atherosclerosis and stable fat containing ventral abdominal hernias. Electronically Signed   By: Ilona Sorrel M.D.   On: 03/30/2015 12:06   Ct Abdomen Pelvis W Contrast  03/30/2015  CLINICAL DATA:  Stage III ascending colon cancer diagnosed in 2015 status post chemotherapy and colonic resection. Patient presents with nausea and back/left flank pain. EXAM: CT CHEST, ABDOMEN, AND PELVIS WITH CONTRAST TECHNIQUE: Multidetector CT imaging of the chest, abdomen and pelvis was performed following the standard protocol during bolus administration of intravenous contrast. CONTRAST:  155mL OMNIPAQUE IOHEXOL 300 MG/ML  SOLN COMPARISON:  09/04/2014 CT chest, abdomen and pelvis. FINDINGS: CT CHEST Mediastinum/Nodes: Normal heart size. No pericardial fluid/thickening. Coronary atherosclerosis. Great vessels are normal in course and caliber. No central pulmonary emboli. Hypodense 0.3 cm right thyroid lobe nodule appears decreased in size from 1.0 cm on 09/04/2014. Oral contrast in the lower thoracic esophagus. No pathologically enlarged axillary, mediastinal or hilar lymph nodes. Lungs/Pleura: No pneumothorax. No pleural effusion. Anterior subpleural right middle lobe 3 mm pulmonary nodule (series 5/ image 39) is stable since 08/05/2013. Right lower lobe 4 mm pulmonary nodule (series 5/ image 34) is stable since 08/05/2013.  No acute consolidative airspace disease, new significant pulmonary nodules or lung masses. Minimal scarring versus atelectasis at both lung bases. Musculoskeletal: No aggressive appearing focal osseous lesions. Stable severe right glenohumeral joint osteoarthritis with bulky clustered intra-articular osseous bodies in the right shoulder joint space. Mild-to-moderate degenerative changes in the thoracic spine. CT ABDOMEN AND PELVIS Hepatobiliary: There is  new mild diffuse intrahepatic biliary ductal dilatation. No liver mass. New prominent gallbladder distention. No definite gallbladder wall thickening. No radiopaque cholelithiasis. New prominently dilated common bile duct (15 mm diameter). No discrete radiopaque choledocholithiasis. Pancreas: There is thickening and heterogeneity of the pancreatic head, with new peripancreatic fat stranding extending into the central mesentery and anterior paranephric retroperitoneal spaces bilaterally, most suggestive of acute pancreatitis. No discrete pancreatic mass. There is new main pancreatic duct dilation (5 mm diameter). Spleen: Normal size spleen containing granulomatous splenic calcifications, with no splenic mass. Adrenals/Urinary Tract: Normal right adrenal. New diffuse left adrenal thickening, with the suggestion of a new 1.2 x 1.1 cm left adrenal hypodense nodule (series 2/ image 57). Parapelvic renal cysts in the bilateral renal sinuses. No hydronephrosis. Limited bladder visualization due to streak artifact from right hip hardware. No gross bladder abnormality. Stomach/Bowel: Grossly normal stomach. There is wall thickening throughout the second and third portions of the duodenum with associated peri-duodenal fat stranding and ill-defined fluid. Normal caliber small bowel. Stable postsurgical changes from right hemicolectomy, with intact appearing ileocolic anastomosis, with no wall thickening or mass at the anastomosis to suggest local tumor recurrence. Mild  diverticulosis in the sigmoid colon, with no wall thickening or pericolonic fat stranding in the residual colon. Oral contrast progresses to the rectum. Vascular/Lymphatic: Atherosclerotic nonaneurysmal abdominal aorta. Patent portal, splenic, hepatic and renal veins. New mild gastrohepatic ligament, porta hepatis, portacaval and left para-aortic lymphadenopathy. For example a 1.0 cm mildly enlarged gastrohepatic ligament node (series 2/ image 53). There is a 1.0 cm mildly enlarged porta hepatis/ peripancreatic node (series 2/ image 30). There is a mildly enlarged 1.3 cm portacaval node (series 2/image 50). There is a mildly enlarged 1.1 cm left para-aortic node (series 2/ image 64). Reproductive: Grossly normal uterus.  No adnexal mass. Other: No pneumoperitoneum, ascites or focal fluid collection. Musculoskeletal: No aggressive appearing focal osseous lesions. Mild-to-moderate degenerative changes in the lumbar spine. Stable small to moderate ventral lateral right abdominal wall fat containing hernias. Stable tiny supraumbilical fat containing hernia near the midline. IMPRESSION: 1. Thickening and heterogeneity of the pancreatic head with peripancreatic fat stranding extending into the central mesentery and bilateral anterior retroperitoneum, most in keeping with acute pancreatitis. 2. New diffuse biliary ductal dilatation and main pancreatic duct dilatation. Gallbladder distention. No discrete pancreatic mass. Consider further evaluation with MRI abdomen with MRCP with and without intravenous contrast to evaluate for choledocholithiasis or obstructing mass. 3. Wall thickening with associated surrounding fat stranding throughout the second and third portions of the duodenum, probably representing reactive duodenitis. 4. New upper retroperitoneal lymphadenopathy, nonspecific. 5. New small left adrenal nodule, indeterminate, metastasis not excluded. This could also be evaluated on MRI abdomen. 6. Two tiny pulmonary  nodules in the right lung, stable since 08/05/2013, probably benign. 7. No evidence of local tumor recurrence at the ileocolic anastomosis. 8. Numerous chronic findings including coronary atherosclerosis and stable fat containing ventral abdominal hernias. Electronically Signed   By: Ilona Sorrel M.D.   On: 03/30/2015 12:06    Medications: I have reviewed the patient's current medications.  Assessment/Plan: 1. Stage IIIB (T3 N2a) poorly different it adenocarcinoma of the right colon, status post a right colectomy and end ileostomy 07/09/2013.  4 of 15 lymph nodes positive for metastatic carcinoma and 2 tumor deposits.   Cycle 1 adjuvant Xeloda 08/15/2013.   Cycle 2 adjuvant Xeloda 09/05/2013.   Cycle 3 adjuvant Xeloda with a dose reduction 10/03/2013.   Cycle 4 adjuvant Xeloda 10/25/2013.  Cycle 5 adjuvant Xeloda 11/16/2013.  Cycle 6 adjuvant Xeloda 12/06/2013  Cycle 7 of adjuvant Xeloda 12/27/2013  Cycle 8 of adjuvant Xeloda 01/17/2014.  Restaging CT scans 02/14/2014 with stable 4 to 5 mm right lower lobe pulmonary nodule; interval enlargement of left periaortic lymphadenopathy.  Ileostomy takedown 03/23/2014  Restaging CT scan 09/04/2014 with stable small pulmonary nodules. Stable and smaller mesenteric and retroperitoneal lymph nodes. No evidence of metastatic disease. 2. History of multiple nonmelanoma skin cancers. 3. History of hypertension. 4. Indeterminate 4 mm right lung base nodule on the CT abdomen 07/06/2013.  Chest CT 08/05/2013 with middle and right lower lobe nodules measuring up to 4 mm. Repeat chest CT planned at a 6-9 month interval.  Restaging chest CT 02/14/2014 with stable 4-5 mm right lower lobe pulmonary nodule. 5. Rash secondary to Xeloda. Resolved 6. 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 Resolved. 7. Iron deficiency anemia-stool Hemoccult positive 12/20/2014, colonoscopy 01/04/2015 negative other than diverticulosis in the sigmoid  colon 8. Nausea/back pain/anorexia. Symptoms persist. CT chest/abdomen/pelvis 03/30/2015 with 2 tiny stable right lung nodules; new mild diffuse intrahepatic biliary ductal dilatation; no liver mass; new prominent gallbladder distention; new prominently dilated common bile duct; thickening and heterogeneity of the pancreatic head with new peripancreatic fat stranding extending into the central mesentery and anterior paranephric retroperitoneal spaces bilaterally; new main pancreatic duct dilatation; new diffuse left adrenal thickening was suggestion of a new 1.2 x 1.1 cm left adrenal nodule; new upper retroperitoneal lymphadenopathy.    Disposition: Ms. Odenthal appears unchanged. She continues to have nausea, back pain and anorexia. The CT scan from earlier today shows dilatation of intrahepatic biliary ducts, common bile duct, main pancreatic duct, new gallbladder distention, thickening of the pancreatic head with peripancreatic fat stranding, upper retroperitoneal lymphadenopathy and a new small left adrenal nodule.  Dr. Benay Spice contacted Dr. Michail Sermon, [facility] gastroenterology, regarding the above findings. She will be placed on a liquid diet and evaluated by gastroenterology early next week. She may need an ERCP or endoscopic ultrasound. We are repeating a chemistry panel today and will also obtain amylase and lipase. Of note, LFTs were normal 03/26/2015.  Dr. Benay Spice discussed with Ms. Vanscoyk and her family that the findings could represent recurrent colon cancer though this would be an unusual pattern of spread. They also understand the findings could represent another malignancy.  She will continue tramadol as needed. She has anti-emetic medication also.  We scheduled a return visit in 3 weeks but will adjust accordingly pending GI evaluation.  Patient seen with Dr. Benay Spice. 30 minutes were spent face-to-face at today's visit with the majority of that time involved in counseling/coordination of  care.   Ned Card ANP/GNP-BC   03/30/2015  1:48 PM  we discussed the CT findings with Ms. Treto and her family. I reviewed the CT images. I contacted Dr. Michail Sermon. The liver enzymes remain normal in the  Lipase is not elevated.  she may have a primary pancreatic , ampullary, or biliary tract tumor. She could also have recurrent colon cancer. She will be scheduled to see Dr. Paulita Fujita to consider an EUS procedure.   She will return for an office visit here in 2-3 weeks.   Julieanne Manson, M.D.

## 2015-03-30 NOTE — Telephone Encounter (Signed)
Message from pt requesting return call. Returned call, no answer. Attempted to reach pt on mobile #. Mailbox is full.

## 2015-04-03 ENCOUNTER — Telehealth: Payer: Self-pay | Admitting: *Deleted

## 2015-04-03 NOTE — Telephone Encounter (Addendum)
Patient called at 3:07 pm.  I'm awaiting a call to let me know what Dr. Benay Spice, Dr. Paulita Fujita and Dr. Penelope Coop have decided to do.  I'm having problems with my upper stomach."  Advised Dr. Gearldine Shown plan on 03-30-2015 is F/U with [facility].  Instructed to await return call from [place] for F/U.  Will notify provider of this call.  Return number home: [phone] or mobile: [phone].  "I ate liquid diet through the weekend.  I'm trying to eat what I can.  I take nausea pill, tramadol and tylenol for pain.  No better but not getting worse either."

## 2015-04-04 ENCOUNTER — Other Ambulatory Visit: Payer: Self-pay | Admitting: Gastroenterology

## 2015-04-04 ENCOUNTER — Encounter: Payer: Self-pay | Admitting: Skilled Nursing Facility1

## 2015-04-04 DIAGNOSIS — K839 Disease of biliary tract, unspecified: Secondary | ICD-10-CM

## 2015-04-04 NOTE — Progress Notes (Signed)
Subjective:     Patient ID: [name], female   DOB: 1925-01-06, 82 y.o.   MRN: [id number]  HPI   Review of Systems     Objective:   Physical Exam To assist the pt in identifying dietary strategies to gain lost wt back.    Assessment:     Pt identified as being malnourished due to lost wt. Pt was contacted via the telephone at [phone]. Pt states she has lost 5 pounds and is now 136 pounds. Pt states she has some nausea but the medicine is helping. Pt states she eats 3 meals a day and drinks an ensure.     Plan:     Dietitian offered recipes for ensure/calorically dense foods. Dietitian advised she treat her food like medicine to encourage her to eat throughout the day even when she has no appetite. Dietitian will send some protein/calorie information.

## 2015-04-05 ENCOUNTER — Other Ambulatory Visit: Payer: Medicare Other

## 2015-04-05 ENCOUNTER — Ambulatory Visit: Payer: Medicare Other | Admitting: Oncology

## 2015-04-06 ENCOUNTER — Telehealth: Payer: Self-pay | Admitting: *Deleted

## 2015-04-06 ENCOUNTER — Encounter: Payer: Self-pay | Admitting: Nutrition

## 2015-04-06 MED ORDER — TRAMADOL HCL 50 MG PO TABS: 50.0000 mg | ORAL_TABLET | Freq: Four times a day (QID) | ORAL | Status: DC | PRN

## 2015-04-06 MED ORDER — TRAMADOL HCL 50 MG PO TABS
50.0000 mg | ORAL_TABLET | Freq: Four times a day (QID) | ORAL | Status: DC | PRN
Start: 2015-04-06 — End: 2015-04-06

## 2015-04-06 NOTE — Progress Notes (Signed)
Mailed ensure recipes to patient's home at her request.

## 2015-04-06 NOTE — Telephone Encounter (Signed)
Message from pt stating she requested refill through her pharmacy and was told to call office. Returned call, needs Ultram refill. Pt reports it is effective for her pain. Usually takes 2/ day. Reviewed with Ned Card, NP: Order received, called in to pharmacy.

## 2015-04-09 ENCOUNTER — Ambulatory Visit
Admission: RE | Admit: 2015-04-09 | Discharge: 2015-04-09 | Disposition: A | Discharge status: Home / Self Care | Payer: Medicare Other | Source: Ambulatory Visit | Attending: Gastroenterology | Admitting: Gastroenterology

## 2015-04-09 DIAGNOSIS — K839 Disease of biliary tract, unspecified: Secondary | ICD-10-CM

## 2015-04-09 MED ORDER — GADOBENATE DIMEGLUMINE 529 MG/ML IV SOLN
12.0000 mL | Freq: Once | INTRAVENOUS | Status: AC | PRN
  Administered 2015-04-09: 12 mL via INTRAVENOUS

## 2015-04-13 ENCOUNTER — Other Ambulatory Visit: Payer: Self-pay | Admitting: *Deleted

## 2015-04-13 DIAGNOSIS — C182 Malignant neoplasm of ascending colon: Secondary | ICD-10-CM

## 2015-04-13 MED ORDER — ONDANSETRON HCL 4 MG PO TABS: 4.0000 mg | ORAL_TABLET | Freq: Four times a day (QID) | ORAL | Status: DC | PRN

## 2015-04-13 NOTE — Telephone Encounter (Signed)
Message requesting refill on Zofran with change in frequency. Pt was instructed by GI MD to change to Q 6hours. Reviewed with Dr. Benay Spice: Rx sent electronically.

## 2015-04-19 ENCOUNTER — Other Ambulatory Visit: Payer: Self-pay | Admitting: *Deleted

## 2015-04-19 ENCOUNTER — Telehealth: Payer: Self-pay | Admitting: Oncology

## 2015-04-19 ENCOUNTER — Telehealth: Payer: Self-pay

## 2015-04-19 ENCOUNTER — Ambulatory Visit (HOSPITAL_BASED_OUTPATIENT_CLINIC_OR_DEPARTMENT_OTHER): Payer: Medicare Other | Admitting: Oncology

## 2015-04-19 VITALS — BP 124/69 | HR 90 | Temp 98.2°F | Resp 18 | Ht 64.0 in | Wt 130.2 lb

## 2015-04-19 DIAGNOSIS — R110 Nausea: Secondary | ICD-10-CM

## 2015-04-19 DIAGNOSIS — Z85828 Personal history of other malignant neoplasm of skin: Secondary | ICD-10-CM

## 2015-04-19 DIAGNOSIS — R918 Other nonspecific abnormal finding of lung field: Secondary | ICD-10-CM

## 2015-04-19 DIAGNOSIS — C189 Malignant neoplasm of colon, unspecified: Secondary | ICD-10-CM | POA: Diagnosis not present

## 2015-04-19 DIAGNOSIS — R634 Abnormal weight loss: Secondary | ICD-10-CM

## 2015-04-19 DIAGNOSIS — C182 Malignant neoplasm of ascending colon: Secondary | ICD-10-CM

## 2015-04-19 MED ORDER — METOCLOPRAMIDE HCL 5 MG PO TABS: 5.0000 mg | ORAL_TABLET | Freq: Three times a day (TID) | ORAL | Status: DC

## 2015-04-19 MED ORDER — LORAZEPAM 0.5 MG PO TABS: 0.2500 mg | ORAL_TABLET | Freq: Four times a day (QID) | ORAL | Status: DC | PRN

## 2015-04-19 NOTE — Telephone Encounter (Signed)
Appointments made and avs printed and given to patient. °

## 2015-04-19 NOTE — Progress Notes (Signed)
[facility] [place] OFFICE PROGRESS NOTE   Diagnosis: Colon cancer, biliary obstruction  INTERVAL HISTORY:   [name] continues to have constant low-grade nausea. Zofran and Compazine have not helped. Intermittent pain at the left lower back. She is losing weight. Dr. Paulita Fujita did not recommend an ERCP or EUS evaluation. She saw Dr. Watt Climes and is being referred to Dr. Jerene Pitch at [facility]. She has not received an appointment date.  Objective:  Vital signs in last 24 hours:  Blood pressure 124/69, pulse 90, temperature 98.2 F (36.8 C), temperature source Oral, resp. rate 18, height 5\' 4"  (1.626 m), weight 130 lb 3.2 oz (59.058 kg), SpO2 99 %.    HEENT: Neck without mass Resp: Lungs clear bilaterally Cardio: Regular rate and rhythm GI: Nontender, no hepatosplenomegaly, no mass Vascular: No leg edema  Skin: Dry erythematous rash over the back     Lab Results:  Lab Results  Component Value Date   WBC 8.3 03/26/2015   HGB 10.9* 03/26/2015   HCT 33.0* 03/26/2015   MCV 82.1 03/26/2015   PLT 366 03/26/2015   NEUTROABS 6.0 03/26/2015      Lab Results  Component Value Date   CEA1 13.1* 03/26/2015    Imaging: MRI abdomen 04/09/2015-circumferential narrowing of the distal common bile duct with biliary ductal dilatation, mild obstruction of the distal pancreatic duct  Medications: I have reviewed the patient's current medications.  Assessment/Plan: 1. Stage IIIB (T3 N2a) poorly different it adenocarcinoma of the right colon, status post a right colectomy and end ileostomy 07/09/2013.  4 of 15 lymph nodes positive for metastatic carcinoma and 2 tumor deposits.   Cycle 1 adjuvant Xeloda 08/15/2013.   Cycle 2 adjuvant Xeloda 09/05/2013.   Cycle 3 adjuvant Xeloda with a dose reduction 10/03/2013.   Cycle 4 adjuvant Xeloda 10/25/2013.   Cycle 5 adjuvant Xeloda 11/16/2013.  Cycle 6 adjuvant Xeloda 12/06/2013  Cycle 7 of adjuvant Xeloda 12/27/2013  Cycle  8 of adjuvant Xeloda 01/17/2014.  Restaging CT scans 02/14/2014 with stable 4 to 5 mm right lower lobe pulmonary nodule; interval enlargement of left periaortic lymphadenopathy.  Ileostomy takedown 03/23/2014  Restaging CT scan 09/04/2014 with stable small pulmonary nodules. Stable and smaller mesenteric and retroperitoneal lymph nodes. No evidence of metastatic disease. 2. History of multiple nonmelanoma skin cancers. 3. History of hypertension. 4. Indeterminate 4 mm right lung base nodule on the CT abdomen 07/06/2013.  Chest CT 08/05/2013 with middle and right lower lobe nodules measuring up to 4 mm. Repeat chest CT planned at a 6-9 month interval.  Restaging chest CT 02/14/2014 with stable 4-5 mm right lower lobe pulmonary nodule. 5. Rash secondary to Xeloda. Resolved 6. 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 Resolved. 7. Iron deficiency anemia-stool Hemoccult positive 12/20/2014, colonoscopy 01/04/2015 negative other than diverticulosis in the sigmoid colon 8. Nausea/back pain/anorexia. Symptoms persist. CT chest/abdomen/pelvis 03/30/2015 with 2 tiny stable right lung nodules; new mild diffuse intrahepatic biliary ductal dilatation; no liver mass; new prominent gallbladder distention; new prominently dilated common bile duct; thickening and heterogeneity of the pancreatic head with new peripancreatic fat stranding extending into the central mesentery and anterior paranephric retroperitoneal spaces bilaterally; new main pancreatic duct dilatation; new diffuse left adrenal thickening was suggestion of a new 1.2 x 1.1 cm left adrenal nodule; new upper retroperitoneal lymphadenopathy.  MRI abdomen 04/09/2015 confirmed circumferential narrowing of the distal common bile duct and narrowing of the distal pancreatic duct. Severe biliary ductal dilatation    Disposition:  [name]  continues to have nausea and she is losing weight. The MRI confirms biliary obstruction. It is unclear  whether the biliary obstruction is secondary to metastatic colon cancer, pancreas cancer, cholangiocarcinoma, or less likely a benign etiology.  [name] and her family would like to proceed with further diagnostic evaluation. They understand she will likely not be a surgical candidate and if she is confirmed to have pancreas or biliary tract cancer treatment options will be limited. They would like to proceed with an EUS/ERCP if recommended by Dr. Jerene Pitch. It is possible placement of a biliary stent could helped the nausea.  We added Reglan and Ativan as anti-emetics today. She will return for an office visit in 2 weeks.  I contacted Dr. Hoyle Sauer' office. He is out of the office today. His office will follow-up on the referral to Dr. Jerene Pitch and contact [name].  Betsy Coder, MD  04/19/2015  10:14 AM

## 2015-04-19 NOTE — Telephone Encounter (Signed)
Pt son Minna Merritts reports his mother has become very weak.  He is requesting a wheelchair and home assistance.  Orders entered for [place] at [facility] notified.    Routed to pod 2

## 2015-04-23 ENCOUNTER — Telehealth: Payer: Self-pay | Admitting: *Deleted

## 2015-04-23 NOTE — Telephone Encounter (Signed)
Received message from [place] with [facility] reporting she faxed over an order for a lightweight wheelchair. Requesting this to be sent back ASAP. Will look for order to come across.Dr. Benay Spice is reaching out to GI to see if pt can be seen sooner than March 9. Called pt's son with this update.

## 2015-04-23 NOTE — Telephone Encounter (Signed)
Message from pt's son stating pt is "starving to death. Something needs to be done." Returned call, Minna Merritts reports pt wants to eat but is too nauseated. She is very weak and seems to be "giving up hope." Pt is drinking small amounts throughout the day. Urine is a medium yellow color.

## 2015-04-26 ENCOUNTER — Other Ambulatory Visit: Payer: Self-pay | Admitting: *Deleted

## 2015-04-26 ENCOUNTER — Telehealth: Payer: Self-pay | Admitting: *Deleted

## 2015-04-26 DIAGNOSIS — C182 Malignant neoplasm of ascending colon: Secondary | ICD-10-CM

## 2015-04-26 MED ORDER — TRAMADOL HCL 50 MG PO TABS: 50.0000 mg | ORAL_TABLET | Freq: Four times a day (QID) | ORAL | Status: DC | PRN

## 2015-04-26 MED ORDER — HYDROCODONE-ACETAMINOPHEN 5-325 MG PO TABS: 1.0000 | ORAL_TABLET | Freq: Four times a day (QID) | ORAL | Status: DC | PRN

## 2015-04-26 NOTE — Progress Notes (Signed)
Patient suffers from colon cancer which impairs their ability to perform daily activities like walking, going to bathroom in the home. A walker will not resolve  issue with performing activities of daily living. A wheelchair will allow patient to safely perform daily activities. Patient is not able to propel themselves in the home using a standard weight wheelchair due to weakness}. Patient can self propel in the lightweight wheelchair.  Accessories: elevating leg rests (ELRs), wheel locks, extensions and anti-tippers.

## 2015-04-26 NOTE — Telephone Encounter (Signed)
Notified pt's son Minna Merritts that Tramadol refill has been called into [facility].  Minna Merritts verbalized understanding and expressed appreciation for call back.

## 2015-04-26 NOTE — Telephone Encounter (Signed)
Received call from son Minna Merritts requesting refill of Tramadol for pt.  Minna Merritts would like for script to be called in to [facility]. [facility]   Phone     [phone].

## 2015-04-27 ENCOUNTER — Other Ambulatory Visit: Payer: Self-pay | Admitting: *Deleted

## 2015-04-27 ENCOUNTER — Encounter: Payer: Self-pay | Admitting: *Deleted

## 2015-04-27 ENCOUNTER — Telehealth: Payer: Self-pay | Admitting: *Deleted

## 2015-04-27 DIAGNOSIS — C182 Malignant neoplasm of ascending colon: Secondary | ICD-10-CM

## 2015-04-27 NOTE — Progress Notes (Signed)
[place] Work  Clinical Social Work was referred by Therapist, sports for assessment of psychosocial needs due to care giving concerns.  Clinical Social Worker contacted daughter at home to offer support and assess for needs.  Daughter is in need of supervision for pt and is aware this is not covered by [facility]. CSW provided daughter CNA list via email and they can possibly supplement care in the short term. Daughter educated on how to apply for medicaid, but it is questionable that pt will meet income guidelines. Daughter calm throughout phone call appeared comfortable with resources available. She is aware to reach out as needed.    Clinical Social Work interventions: Resource education  Loren Racer, [place] Worker [place]  [place] Phone: [phone] Fax: [phone]

## 2015-04-27 NOTE — Telephone Encounter (Signed)
Pt daughter Colletta [place] called reports "I not sure what do do; she is very weak and I can't get her up; do I need to call 911?"  Daughter states that "right now she is eating a big lunch, talking to me; no trouble swallowing; no slurred speak; pain med is keeping her pain under control"  Pt does live alone and family needs help in the home.  Per Dr. Benay Spice; notified daughter that social worker will call her today regarding services and PT consult has been sent to evaluate pt strength, etc.  Daughter verbalized understanding that services would not start / be evaluated until next week and family needs to cover this weekend re: safety factor/concerns since pt live alone.  Daughter states that brother will stay with pt through the weekend. Daughter expressed appreciation for call back.

## 2015-05-01 ENCOUNTER — Other Ambulatory Visit: Payer: Self-pay | Admitting: *Deleted

## 2015-05-01 ENCOUNTER — Telehealth: Payer: Self-pay | Admitting: *Deleted

## 2015-05-01 DIAGNOSIS — C182 Malignant neoplasm of ascending colon: Secondary | ICD-10-CM

## 2015-05-01 MED ORDER — ONDANSETRON HCL 8 MG PO TABS
8.0000 mg | ORAL_TABLET | Freq: Three times a day (TID) | ORAL | Status: AC | PRN
Start: 2015-05-01 — End: ?

## 2015-05-01 NOTE — Progress Notes (Signed)
Phone call received from pt.'s son, Minna Merritts.  Pt has pre op appt on 05/03/15 with [facility] for her ultrasound on 05/04/15 and will not be able to keep appt with L. Thomas NP on 05/03/15.  Dr. Benay Spice would like for pt to be seen early next week.  POF sent.  Return call placed to pt.'s son to notify him of above information.  Pt.'s son has no further questions at this time.

## 2015-05-01 NOTE — Telephone Encounter (Signed)
Spoke to [place] at [facility].  Pt has 2 refills on previous Zofran prescription.

## 2015-05-02 ENCOUNTER — Telehealth: Payer: Self-pay | Admitting: Oncology

## 2015-05-02 ENCOUNTER — Telehealth: Payer: Self-pay | Admitting: *Deleted

## 2015-05-02 NOTE — Telephone Encounter (Signed)
talked with Tanya(Dr Sherrill)stated sch appt shw will call son to explain that's its ok for pt to come in Monday

## 2015-05-02 NOTE — Telephone Encounter (Signed)
Call from pt's daughter to follow up on PT referral. They have not heard from anyone regarding this.  Call from Aaron Edelman, RN with [facility] requesting to extend nursing services for a few more weeks. Per Patty, they have not received the PT referral. Verbal order given per referral noted in chart. She will initiate this, PT will likely be out to see her early next week.  Called daughter with above info. Chong Sicilian will fax over order request.

## 2015-05-03 ENCOUNTER — Telehealth: Payer: Self-pay | Admitting: *Deleted

## 2015-05-03 ENCOUNTER — Telehealth: Payer: Self-pay

## 2015-05-03 ENCOUNTER — Ambulatory Visit: Payer: Medicare Other | Admitting: Nurse Practitioner

## 2015-05-03 NOTE — Telephone Encounter (Signed)
Call returned to pt concerning her appt on Monday-05/07/15.  Pt states that she is not sure if she will be able to make appt.  She is going to call her children this evening and call us back on 05/04/15 to confirm appt for 05/07/15. Pt has no other questions at this time.

## 2015-05-03 NOTE — Telephone Encounter (Signed)
Patient calling requesting Dr. Gearldine Shown desk nurse to call her back today to discuss her appts and why she is not able to keep them.

## 2015-05-04 ENCOUNTER — Other Ambulatory Visit: Payer: Self-pay | Admitting: Nurse Practitioner

## 2015-05-04 ENCOUNTER — Telehealth: Payer: Self-pay | Admitting: *Deleted

## 2015-05-04 NOTE — Telephone Encounter (Signed)
Call received from pt.'s daughter Colletta [place] concerning PT referral.  Instructed Colletta Maryland that per note on 05/02/15, [facility] will be contacting them early next week regarding PT services.  Pt.'s daughter has several questions regarding placement for her mother and also if pt would benefit from hospice referral.  Pt has appt on 05/07/15 with Dr. Benay Spice, pt.'s daughter told that Dr. Benay Spice will discuss her concerns with her at [facility] appointment.

## 2015-05-04 NOTE — Telephone Encounter (Signed)
Return call placed to pt regarding pt.'s question about appt on Monday, 05/07/15.  Pt notified that she is scheduled to see Dr. Benay Spice on 05/07/15 at 0900.  Pt verbalizes an understanding of appt time and will call if any issues arise.

## 2015-05-07 ENCOUNTER — Other Ambulatory Visit: Payer: Medicare Other

## 2015-05-07 ENCOUNTER — Inpatient Hospital Stay (HOSPITAL_COMMUNITY)
Admission: AD | Admit: 2015-05-07 | Discharge: 2015-05-11 | DRG: 054 | Disposition: A | Discharge status: Skilled Nursing Facility | Payer: Medicare Other | Source: Ambulatory Visit | Attending: Internal Medicine | Admitting: Internal Medicine

## 2015-05-07 ENCOUNTER — Ambulatory Visit (HOSPITAL_BASED_OUTPATIENT_CLINIC_OR_DEPARTMENT_OTHER): Payer: Medicare Other

## 2015-05-07 ENCOUNTER — Encounter (HOSPITAL_COMMUNITY): Payer: Self-pay

## 2015-05-07 ENCOUNTER — Ambulatory Visit (HOSPITAL_BASED_OUTPATIENT_CLINIC_OR_DEPARTMENT_OTHER): Payer: Medicare Other | Admitting: Oncology

## 2015-05-07 VITALS — BP 130/87 | HR 96 | Temp 97.9°F | Resp 18 | Ht 64.0 in

## 2015-05-07 DIAGNOSIS — Z66 Do not resuscitate: Secondary | ICD-10-CM | POA: Diagnosis present

## 2015-05-07 DIAGNOSIS — Z85038 Personal history of other malignant neoplasm of large intestine: Secondary | ICD-10-CM | POA: Diagnosis not present

## 2015-05-07 DIAGNOSIS — Z9221 Personal history of antineoplastic chemotherapy: Secondary | ICD-10-CM

## 2015-05-07 DIAGNOSIS — Z79899 Other long term (current) drug therapy: Secondary | ICD-10-CM

## 2015-05-07 DIAGNOSIS — R110 Nausea: Secondary | ICD-10-CM | POA: Diagnosis present

## 2015-05-07 DIAGNOSIS — L8990 Pressure ulcer of unspecified site, unspecified stage: Secondary | ICD-10-CM | POA: Insufficient documentation

## 2015-05-07 DIAGNOSIS — G9389 Other specified disorders of brain: Secondary | ICD-10-CM

## 2015-05-07 DIAGNOSIS — G8190 Hemiplegia, unspecified affecting unspecified side: Secondary | ICD-10-CM | POA: Insufficient documentation

## 2015-05-07 DIAGNOSIS — Z801 Family history of malignant neoplasm of trachea, bronchus and lung: Secondary | ICD-10-CM | POA: Diagnosis not present

## 2015-05-07 DIAGNOSIS — G8194 Hemiplegia, unspecified affecting left nondominant side: Secondary | ICD-10-CM | POA: Diagnosis present

## 2015-05-07 DIAGNOSIS — Z515 Encounter for palliative care: Secondary | ICD-10-CM | POA: Diagnosis present

## 2015-05-07 DIAGNOSIS — C779 Secondary and unspecified malignant neoplasm of lymph node, unspecified: Secondary | ICD-10-CM | POA: Diagnosis present

## 2015-05-07 DIAGNOSIS — C7931 Secondary malignant neoplasm of brain: Principal | ICD-10-CM | POA: Insufficient documentation

## 2015-05-07 DIAGNOSIS — C182 Malignant neoplasm of ascending colon: Secondary | ICD-10-CM | POA: Diagnosis not present

## 2015-05-07 DIAGNOSIS — E860 Dehydration: Secondary | ICD-10-CM | POA: Diagnosis present

## 2015-05-07 DIAGNOSIS — Z7982 Long term (current) use of aspirin: Secondary | ICD-10-CM | POA: Diagnosis not present

## 2015-05-07 DIAGNOSIS — R627 Adult failure to thrive: Secondary | ICD-10-CM

## 2015-05-07 DIAGNOSIS — D509 Iron deficiency anemia, unspecified: Secondary | ICD-10-CM

## 2015-05-07 DIAGNOSIS — E785 Hyperlipidemia, unspecified: Secondary | ICD-10-CM | POA: Diagnosis present

## 2015-05-07 DIAGNOSIS — Z9049 Acquired absence of other specified parts of digestive tract: Secondary | ICD-10-CM | POA: Diagnosis not present

## 2015-05-07 DIAGNOSIS — M1990 Unspecified osteoarthritis, unspecified site: Secondary | ICD-10-CM | POA: Diagnosis present

## 2015-05-07 DIAGNOSIS — M549 Dorsalgia, unspecified: Secondary | ICD-10-CM

## 2015-05-07 DIAGNOSIS — L89159 Pressure ulcer of sacral region, unspecified stage: Secondary | ICD-10-CM | POA: Diagnosis present

## 2015-05-07 DIAGNOSIS — E869 Volume depletion, unspecified: Secondary | ICD-10-CM | POA: Diagnosis present

## 2015-05-07 DIAGNOSIS — B9561 Methicillin susceptible Staphylococcus aureus infection as the cause of diseases classified elsewhere: Secondary | ICD-10-CM | POA: Diagnosis not present

## 2015-05-07 DIAGNOSIS — G936 Cerebral edema: Secondary | ICD-10-CM | POA: Diagnosis present

## 2015-05-07 DIAGNOSIS — C183 Malignant neoplasm of hepatic flexure: Secondary | ICD-10-CM | POA: Diagnosis present

## 2015-05-07 DIAGNOSIS — R531 Weakness: Secondary | ICD-10-CM | POA: Diagnosis present

## 2015-05-07 DIAGNOSIS — Z96649 Presence of unspecified artificial hip joint: Secondary | ICD-10-CM | POA: Diagnosis present

## 2015-05-07 DIAGNOSIS — K5730 Diverticulosis of large intestine without perforation or abscess without bleeding: Secondary | ICD-10-CM | POA: Diagnosis present

## 2015-05-07 DIAGNOSIS — M545 Low back pain: Secondary | ICD-10-CM | POA: Diagnosis present

## 2015-05-07 DIAGNOSIS — I10 Essential (primary) hypertension: Secondary | ICD-10-CM | POA: Diagnosis present

## 2015-05-07 DIAGNOSIS — R630 Anorexia: Secondary | ICD-10-CM | POA: Diagnosis present

## 2015-05-07 DIAGNOSIS — I213 ST elevation (STEMI) myocardial infarction of unspecified site: Secondary | ICD-10-CM | POA: Diagnosis not present

## 2015-05-07 DIAGNOSIS — E871 Hypo-osmolality and hyponatremia: Secondary | ICD-10-CM | POA: Diagnosis present

## 2015-05-07 DIAGNOSIS — Z85828 Personal history of other malignant neoplasm of skin: Secondary | ICD-10-CM

## 2015-05-07 DIAGNOSIS — Z961 Presence of intraocular lens: Secondary | ICD-10-CM | POA: Diagnosis present

## 2015-05-07 DIAGNOSIS — K5900 Constipation, unspecified: Secondary | ICD-10-CM | POA: Diagnosis present

## 2015-05-07 DIAGNOSIS — R112 Nausea with vomiting, unspecified: Secondary | ICD-10-CM

## 2015-05-07 DIAGNOSIS — C189 Malignant neoplasm of colon, unspecified: Secondary | ICD-10-CM | POA: Diagnosis not present

## 2015-05-07 DIAGNOSIS — C7989 Secondary malignant neoplasm of other specified sites: Secondary | ICD-10-CM | POA: Diagnosis not present

## 2015-05-07 DIAGNOSIS — K838 Other specified diseases of biliary tract: Secondary | ICD-10-CM | POA: Diagnosis not present

## 2015-05-07 DIAGNOSIS — M799 Soft tissue disorder, unspecified: Secondary | ICD-10-CM

## 2015-05-07 DIAGNOSIS — N390 Urinary tract infection, site not specified: Secondary | ICD-10-CM | POA: Diagnosis not present

## 2015-05-07 DIAGNOSIS — R778 Other specified abnormalities of plasma proteins: Secondary | ICD-10-CM | POA: Diagnosis not present

## 2015-05-07 LAB — COMPREHENSIVE METABOLIC PANEL
ALBUMIN: 3.5 g/dL (ref 3.5–5.0)
ALT: 41 U/L (ref 0–55)
ANION GAP: 13 meq/L — AB (ref 3–11)
AST: 40 U/L — ABNORMAL HIGH (ref 5–34)
Alkaline Phosphatase: 177 U/L — ABNORMAL HIGH (ref 40–150)
BUN: 9.1 mg/dL (ref 7.0–26.0)
CO2: 23 meq/L (ref 22–29)
Calcium: 10 mg/dL (ref 8.4–10.4)
Chloride: 94 mEq/L — ABNORMAL LOW (ref 98–109)
Creatinine: 0.8 mg/dL (ref 0.6–1.1)
EGFR: 67 mL/min/{1.73_m2} — AB (ref >90)
GLUCOSE: 86 mg/dL (ref 70–140)
POTASSIUM: 3.4 meq/L — AB (ref 3.5–5.1)
SODIUM: 129 meq/L — AB (ref 136–145)
Total Bilirubin: 0.46 mg/dL (ref 0.20–1.20)
Total Protein: 7.6 g/dL (ref 6.4–8.3)

## 2015-05-07 LAB — CBC WITH DIFFERENTIAL/PLATELET
BASO%: 0.7 % (ref 0.0–2.0)
BASOS ABS: 0 10*3/uL (ref 0.0–0.1)
EOS ABS: 0.2 10*3/uL (ref 0.0–0.5)
EOS%: 2.3 % (ref 0.0–7.0)
HCT: 37.8 % (ref 34.8–46.6)
HGB: 12.5 g/dL (ref 11.6–15.9)
LYMPH%: 15 % (ref 14.0–49.7)
MCH: 28 pg (ref 25.1–34.0)
MCHC: 33.1 g/dL (ref 31.5–36.0)
MCV: 84.4 fL (ref 79.5–101.0)
MONO#: 0.6 10*3/uL (ref 0.1–0.9)
MONO%: 8.6 % (ref 0.0–14.0)
NEUT#: 5 10*3/uL (ref 1.5–6.5)
NEUT%: 73.4 % (ref 38.4–76.8)
Platelets: 452 10*3/uL — ABNORMAL HIGH (ref 145–400)
RBC: 4.48 10*6/uL (ref 3.70–5.45)
RDW: 17.3 % — ABNORMAL HIGH (ref 11.2–14.5)
WBC: 6.9 10*3/uL (ref 3.9–10.3)
lymph#: 1 10*3/uL (ref 0.9–3.3)

## 2015-05-07 MED ORDER — ONDANSETRON HCL 4 MG/2ML IJ SOLN
4.0000 mg | Freq: Four times a day (QID) | INTRAMUSCULAR | Status: DC | PRN
  Administered 2015-05-07 – 2015-05-08 (×4): 4 mg via INTRAVENOUS
  Filled 2015-05-07 (×4): qty 2

## 2015-05-07 MED ORDER — HYDROCODONE-ACETAMINOPHEN 5-325 MG PO TABS
1.0000 | ORAL_TABLET | Freq: Four times a day (QID) | ORAL | Status: DC | PRN
  Administered 2015-05-07 – 2015-05-11 (×8): 1 via ORAL
  Filled 2015-05-07 (×8): qty 1

## 2015-05-07 MED ORDER — SODIUM CHLORIDE 0.9 % IV SOLN
INTRAVENOUS | Status: AC
  Administered 2015-05-08: 13:00:00 via INTRAVENOUS

## 2015-05-07 MED ORDER — ACETAMINOPHEN 650 MG RE SUPP: 650.0000 mg | Freq: Four times a day (QID) | RECTAL | Status: DC | PRN

## 2015-05-07 MED ORDER — ONDANSETRON HCL 4 MG PO TABS: 4.0000 mg | ORAL_TABLET | Freq: Four times a day (QID) | ORAL | Status: DC | PRN

## 2015-05-07 MED ORDER — MORPHINE SULFATE (PF) 2 MG/ML IV SOLN
2.0000 mg | INTRAVENOUS | Status: DC | PRN
Start: 2015-05-07 — End: 2015-05-11
  Administered 2015-05-07 (×2): 2 mg via INTRAVENOUS
  Administered 2015-05-08 (×2): 4 mg via INTRAVENOUS
  Administered 2015-05-08: 2 mg via INTRAVENOUS
  Administered 2015-05-08 (×2): 4 mg via INTRAVENOUS
  Administered 2015-05-08 (×2): 2 mg via INTRAVENOUS
  Administered 2015-05-09 (×3): 4 mg via INTRAVENOUS
  Administered 2015-05-09: 2 mg via INTRAVENOUS
  Administered 2015-05-09 – 2015-05-10 (×7): 4 mg via INTRAVENOUS
  Administered 2015-05-10: 2 mg via INTRAVENOUS
  Administered 2015-05-10 – 2015-05-11 (×4): 4 mg via INTRAVENOUS
  Filled 2015-05-07: qty 2
  Filled 2015-05-07 (×2): qty 1
  Filled 2015-05-07: qty 2
  Filled 2015-05-07: qty 1
  Filled 2015-05-07 (×10): qty 2
  Filled 2015-05-07: qty 1
  Filled 2015-05-07 (×5): qty 2
  Filled 2015-05-07: qty 1
  Filled 2015-05-07: qty 2
  Filled 2015-05-07 (×2): qty 1

## 2015-05-07 MED ORDER — LORAZEPAM 2 MG/ML IJ SOLN: 0.5000 mg | Freq: Once | INTRAMUSCULAR | Status: AC

## 2015-05-07 MED ORDER — LORAZEPAM 0.5 MG PO TABS
0.2500 mg | ORAL_TABLET | Freq: Four times a day (QID) | ORAL | Status: DC | PRN
  Administered 2015-05-10: 0.25 mg via ORAL
  Filled 2015-05-07: qty 1

## 2015-05-07 MED ORDER — SODIUM CHLORIDE 0.9 % IV SOLN
Freq: Once | INTRAVENOUS | Status: AC
  Administered 2015-05-07: 12:00:00 via INTRAVENOUS

## 2015-05-07 MED ORDER — LIP MEDEX EX OINT
TOPICAL_OINTMENT | CUTANEOUS | Status: AC
  Filled 2015-05-07: qty 7

## 2015-05-07 MED ORDER — MORPHINE SULFATE 4 MG/ML IJ SOLN
2.0000 mg | Freq: Once | INTRAMUSCULAR | Status: AC
  Administered 2015-05-07: 2 mg via INTRAVENOUS
  Filled 2015-05-07: qty 1

## 2015-05-07 MED ORDER — ACETAMINOPHEN 325 MG PO TABS: 650.0000 mg | ORAL_TABLET | Freq: Four times a day (QID) | ORAL | Status: DC | PRN

## 2015-05-07 MED ORDER — LORAZEPAM 2 MG/ML IJ SOLN
0.5000 mg | Freq: Once | INTRAMUSCULAR | Status: AC | PRN
  Administered 2015-05-08: 0.5 mg via INTRAVENOUS
  Filled 2015-05-07: qty 1

## 2015-05-07 MED ORDER — SODIUM CHLORIDE 0.9 % IV SOLN
Freq: Once | INTRAVENOUS | Status: AC
  Administered 2015-05-07: 12:00:00 via INTRAVENOUS
  Filled 2015-05-07: qty 4

## 2015-05-07 MED ORDER — ASPIRIN EC 81 MG PO TBEC
81.0000 mg | DELAYED_RELEASE_TABLET | Freq: Every day | ORAL | Status: DC
  Administered 2015-05-08 – 2015-05-11 (×4): 81 mg via ORAL
  Filled 2015-05-07 (×4): qty 1

## 2015-05-07 MED ORDER — POTASSIUM CHLORIDE CRYS ER 20 MEQ PO TBCR
40.0000 meq | EXTENDED_RELEASE_TABLET | Freq: Once | ORAL | Status: AC
  Administered 2015-05-07: 40 meq via ORAL
  Filled 2015-05-07: qty 2

## 2015-05-07 MED ORDER — HYDRALAZINE HCL 20 MG/ML IJ SOLN: 10.0000 mg | Freq: Four times a day (QID) | INTRAMUSCULAR | Status: DC | PRN

## 2015-05-07 MED ORDER — VITAMINS A & D EX OINT
TOPICAL_OINTMENT | CUTANEOUS | Status: AC
Start: 2015-05-07 — End: 2015-05-07
  Administered 2015-05-07: 5
  Filled 2015-05-07: qty 5

## 2015-05-07 MED ORDER — MORPHINE SULFATE (PF) 2 MG/ML IV SOLN
1.0000 mg | INTRAVENOUS | Status: DC | PRN
  Administered 2015-05-07: 2 mg via INTRAVENOUS
  Filled 2015-05-07 (×2): qty 1

## 2015-05-07 MED ORDER — ENOXAPARIN SODIUM 40 MG/0.4ML ~~LOC~~ SOLN
40.0000 mg | SUBCUTANEOUS | Status: DC
  Administered 2015-05-07 – 2015-05-10 (×4): 40 mg via SUBCUTANEOUS
  Filled 2015-05-07 (×4): qty 0.4

## 2015-05-07 MED ORDER — ADULT MULTIVITAMIN W/MINERALS CH
1.0000 | ORAL_TABLET | Freq: Every day | ORAL | Status: DC
  Administered 2015-05-08 – 2015-05-09 (×2): 1 via ORAL
  Filled 2015-05-07 (×2): qty 1

## 2015-05-07 MED ORDER — MORPHINE SULFATE (PF) 4 MG/ML IV SOLN
INTRAVENOUS | Status: AC
  Filled 2015-05-07: qty 1

## 2015-05-07 NOTE — Progress Notes (Signed)
1355-Pt tx'd to 1339 via wheelchair; 2+ assist into bed with receiving RN Nira Conn).

## 2015-05-07 NOTE — Progress Notes (Signed)
[place] OFFICE PROGRESS NOTE   Diagnosis: Colon cancer  INTERVAL HISTORY:   [name] returns for scheduled visit. She can should have nausea. The nausea is partially relieved with Ativan and Zofran. She complains of back pain. She has developed a "sore "at the sacrum.  For approximate the past 2 weeks she has noted weakness of the left arm and leg. She can no longer ambulate. Her family has been helping her to a bedside commode.  She underwent a EUS at [facility] on 05/04/2015. Ascites was noted. There is a 1 cm perihepatic node. The common bile duct is dilated to 11 mm. The pancreatic duct is not dilated. No pancreas mass. A soft tissue mass was noted near the bile duct. No axillary mass. FNA biopsies were obtained from the duodenal bulb. The pathology is pending.   Objective:  Vital signs in last 24 hours:  Blood pressure 130/87, pulse 96, temperature 97.9 F (36.6 C), temperature source Oral, resp. rate 18, height 5\' 4"  (1.626 m), SpO2 97 %.    Resp: Lungs clear bilaterally Cardio: Regular rate and rhythm GI: Soft and nontender, no mass Vascular: No leg edema Neuro: Alert, 3-4/5 strength at the left arm, 3/5 strength at the left leg and foot, she cannot ambulate Skin: superficial sacral decubitus ulcer       Medications: I have reviewed the patient's current medications.  Assessment/Plan: 1. Stage IIIB (T3 N2a) poorly different it adenocarcinoma of the right colon, status post a right colectomy and end ileostomy 07/09/2013.  4 of 15 lymph nodes positive for metastatic carcinoma and 2 tumor deposits.   Cycle 1 adjuvant Xeloda 08/15/2013.   Cycle 2 adjuvant Xeloda 09/05/2013.   Cycle 3 adjuvant Xeloda with a dose reduction 10/03/2013.   Cycle 4 adjuvant Xeloda 10/25/2013.   Cycle 5 adjuvant Xeloda 11/16/2013.  Cycle 6 adjuvant Xeloda 12/06/2013  Cycle 7 of adjuvant Xeloda 12/27/2013  Cycle 8 of adjuvant Xeloda 01/17/2014.  Restaging  CT scans 02/14/2014 with stable 4 to 5 mm right lower lobe pulmonary nodule; interval enlargement of left periaortic lymphadenopathy.  Ileostomy takedown 03/23/2014  Restaging CT scan 09/04/2014 with stable small pulmonary nodules. Stable and smaller mesenteric and retroperitoneal lymph nodes. No evidence of metastatic disease. 2. History of multiple nonmelanoma skin cancers. 3. History of hypertension. 4. Indeterminate 4 mm right lung base nodule on the CT abdomen 07/06/2013.  Chest CT 08/05/2013 with middle and right lower lobe nodules measuring up to 4 mm. Repeat chest CT planned at a 6-9 month interval.  Restaging chest CT 02/14/2014 with stable 4-5 mm right lower lobe pulmonary nodule. 5. Rash secondary to Xeloda. Resolved 6. 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 Resolved. 7. Iron deficiency anemia-stool Hemoccult positive 12/20/2014, colonoscopy 01/04/2015 negative other than diverticulosis in the sigmoid colon 8. Nausea/back pain/anorexia. Symptoms persist. CT chest/abdomen/pelvis 03/30/2015 with 2 tiny stable right lung nodules; new mild diffuse intrahepatic biliary ductal dilatation; no liver mass; new prominent gallbladder distention; new prominently dilated common bile duct; thickening and heterogeneity of the pancreatic head with new peripancreatic fat stranding extending into the central mesentery and anterior paranephric retroperitoneal spaces bilaterally; new main pancreatic duct dilatation; new diffuse left adrenal thickening was suggestion of a new 1.2 x 1.1 cm left adrenal nodule; new upper retroperitoneal lymphadenopathy.  MRI abdomen 04/09/2015 confirmed circumferential narrowing of the distal common bile duct and narrowing of the distal pancreatic duct. Severe biliary ductal dilatation   EUS 05/04/2015 confirmed a dilated common bile duct,  mass near the ampulla, FNA biopsy-pathology pending  9.   Left hemiplegia  10. Sacral decubitus ulcer   Disposition:   [name] has a history of colon cancer. She has failure to thrive and progressive nausea/back pain. An EUS confirms a mass near the ampulla. I suspect she has metastatic colon cancer with carcinomatosis.  She presents today with a left hemiplegia. This could be related to a CVA or brain metastases. She can no longer care for herself in the home. She will need to be admitted for evaluation of the left hemiplegia and placement.  I discussed CPR and ACLS issues with [name] and her daughter. She will be placed on a no CODE BLUE status.  I will contact the hospitalist service to arrange for hospital admission. We will contact Dr. Watt Climes to see if he feels placement of a bile duct stent will alleviate her nausea or pain.  Betsy Coder, MD  05/07/2015  9:42 AM

## 2015-05-07 NOTE — H&P (Addendum)
[facility] History and Physical  [name] RSW:[id number] DOB: Sep 08, 1924 DOA: 05/07/2015   PCP: Melinda Crutch  Specialists: She is followed by Dr. Benay Spice with oncology. Dr. Penelope Coop is her gastroenterologist.  Chief Complaint: Left-sided weakness for 2 weeks  HPI: [name] is a 82 y.o. female with a past medical history of colon cancer, status post surgery and chemotherapy in 2015. She had reversal of her colostomy at that time. She also has a history of hypertension. She was in her usual state of health till 2-3 months ago when she started developing nausea. And she also had abdominal discomfort. She was seen by a gastroenterologist. She underwent EGD was apparently unremarkable. She subsequently underwent a CT scan of her abdomen, pelvis, which showed dilated bile ducts. This prompted an MRI of her abdomen, which revealed significantly dilated biliary ducts. There was some concern about a pancreatic or biliary tumor. Patient was referred to [facility] where she underwent an endoscopic ultrasound just a few days ago. This revealed a soft tissue lesion/mass around the biliary duct area. Could be in the ampulla or in the duodenum. Biopsies were taken. Results are pending. She came in to see her oncologist today and mentioned that she has been having weakness in her left side arm and leg, leg more than the arm for the past 2-3 weeks. She denies any falls or injuries. Denies any headaches. Denies any facial droop. No speech difficulty mentioned by the daughter. She does have low back pain. No fever, no chills. Nausea has been persistent but no vomiting. She has lost about 12 pounds within the last 1 month. She denies any chest pain, shortness of breath.   Dr. Learta Codding was concerned about an intracranial process such as a metastatic lesion. She was also thought to be dehydrated. Patient was directly admitted to the hospital for further management.  Home Medications: Prior to  Admission medications   Medication Sig Start Date End Date Taking? Authorizing Provider  amLODipine (NORVASC) 5 MG tablet Take 5 mg by mouth daily.  02/01/13  Yes Historical Provider, MD  aspirin EC 81 MG tablet Take 81 mg by mouth daily.   Yes Historical Provider, MD  fluocinonide cream (LIDEX) 0.93 % Apply 1 application topically 2 (two) times daily as needed (rash).  03/05/15  Yes Historical Provider, MD  LORazepam (ATIVAN) 0.5 MG tablet Take 0.5 tablets (0.25 mg total) by mouth every 6 (six) hours as needed for anxiety. 04/19/15  Yes Ladell Pier, MD  losartan-hydrochlorothiazide (HYZAAR) 50-12.5 MG tablet Take 1 tablet by mouth daily. 05/03/15  Yes Historical Provider, MD  ondansetron (ZOFRAN) 8 MG tablet Take 1 tablet (8 mg total) by mouth every 8 (eight) hours as needed for nausea or vomiting. 05/01/15  Yes Ladell Pier, MD  traMADol (ULTRAM) 50 MG tablet Take 1 tablet (50 mg total) by mouth every 6 (six) hours as needed. Patient taking differently: Take 50 mg by mouth every 6 (six) hours as needed (pain).  04/26/15  Yes Ladell Pier, MD  HYDROcodone-acetaminophen (NORCO/VICODIN) 5-325 MG tablet Take 1 tablet by mouth every 6 (six) hours as needed for moderate pain. 04/26/15   Ladell Pier, MD  prochlorperazine (COMPAZINE) 5 MG tablet Take 1 tablet (5 mg total) by mouth every 6 (six) hours as needed for nausea or vomiting. 09/22/13   Owens Shark, NP    Allergies: No Known Allergies  Past Medical History: Past Medical History  Diagnosis Date  . Hypertension   .  Hyperlipidemia   . Arthritis   . Cancer [facility])     colon    Past Surgical History  Procedure Laterality Date  . Total hip arthroplasty    . Colonoscopy with propofol N/A 07/08/2013    Procedure: COLONOSCOPY WITH PROPOFOL;  Surgeon: Wonda Horner, MD;  Location: [facility] ENDOSCOPY;  Service: Endoscopy;  Laterality: N/A;  . Partial colectomy Right 07/09/2013    Procedure: PARTIAL COLECTOMY AND EXCISION OF NEVUS FROM RIGHT  ABDOMINAL WALL;  Surgeon: Earnstine Regal, MD;  Location: [facility] ORS;  Service: General;  Laterality: Right;  . Colon surgery    . Eye surgery      bilateral cataract with lens implants  . Ileostomy closure N/A 03/23/2014    Procedure: ILEOSTOMY CLOSURE;  Surgeon: Armandina Gemma, MD;  Location: [facility] ORS;  Service: General;  Laterality: N/A;  . Parastomal hernia repair N/A 03/23/2014    Procedure: HERNIA REPAIR PARASTOMAL;  Surgeon: Armandina Gemma, MD;  Location: [facility] ORS;  Service: General;  Laterality: N/A;    Social History: Patient lives alone in [place]. Her son has been staying with her for the past few weeks. No history of smoking. No alcohol use. Prior to the onset of nausea and abdominal pain, patient was independent with daily activities. Over the past few weeks her activity level has declined especially in the last 2 weeks due to her left-sided weakness.  Family History:  Family History  Problem Relation Age of Onset  . Lung cancer Sister      Review of Systems - History obtained from the patient General ROS: positive for  - fatigue Psychological ROS: negative Ophthalmic ROS: negative ENT ROS: negative Allergy and Immunology ROS: negative Hematological and Lymphatic ROS: negative Endocrine ROS: negative Respiratory ROS: no cough, shortness of breath, or wheezing Cardiovascular ROS: no chest pain or dyspnea on exertion Gastrointestinal ROS: as in hpi Genito-Urinary ROS: no dysuria, trouble voiding, or hematuria Musculoskeletal ROS: negative Neurological ROS: as in hpi Dermatological ROS: negative  Physical Examination  Filed Vitals:   05/07/15 1447  BP: 164/81  Pulse: 87  Temp: 98.3 F (36.8 C)  TempSrc: Oral  Resp: 16  Height: '5\' 4"'$  (1.626 m)  Weight: 55.2 kg (121 lb 11.1 oz)  SpO2: 97%    BP 164/81 mmHg  Pulse 87  Temp(Src) 98.3 F (36.8 C) (Oral)  Resp 16  Ht '5\' 4"'$  (1.626 m)  Wt 55.2 kg (121 lb 11.1 oz)  BMI 20.88 kg/m2  SpO2 97%  General appearance: alert,  cooperative, appears stated age and no distress Head: Normocephalic, without obvious abnormality, atraumatic Eyes: conjunctivae/corneas clear. PERRL, EOM's intact.  Throat: Slightly dry mucous membrane Neck: no adenopathy, no carotid bruit, no JVD, supple, symmetrical, trachea midline and thyroid not enlarged, symmetric, no tenderness/mass/nodules Resp: clear to auscultation bilaterally Cardio: regular rate and rhythm, S1, S2 normal, no murmur, click, rub or gallop GI: Abdomen is soft. Tender in the right upper quadrant without any rebound, rigidity or guarding. No masses or organomegaly. Bowel sounds are present.  Extremities: extremities normal, atraumatic, no cyanosis or edema Pulses: 2+ and symmetric Skin: Skin color, texture, turgor normal. No rashes or lesions Lymph nodes: Cervical, supraclavicular, and axillary nodes normal. Neurologic: Awake and alert. Oriented 3. No facial asymmetry. Cranial nerves II-12 intact. Motor strength is about 2-3 out of 5 in the left upper extremity. 3 out of 5 in the left lower extremity. No reflexes appreciated. Pronator drift on the left. Gait not assessed.  Laboratory Data: Results  for orders placed or performed in visit on 05/07/15 (from the past 48 hour(s))  CBC with Differential     Status: Abnormal   Collection Time: 05/07/15 11:29 AM  Result Value Ref Range   WBC 6.9 3.9 - 10.3 10e3/uL   NEUT# 5.0 1.5 - 6.5 10e3/uL   HGB 12.5 11.6 - 15.9 g/dL   HCT 37.8 34.8 - 46.6 %   Platelets 452 (H) 145 - 400 10e3/uL   MCV 84.4 79.5 - 101.0 fL   MCH 28.0 25.1 - 34.0 pg   MCHC 33.1 31.5 - 36.0 g/dL   RBC 4.48 3.70 - 5.45 10e6/uL   RDW 17.3 (H) 11.2 - 14.5 %   lymph# 1.0 0.9 - 3.3 10e3/uL   MONO# 0.6 0.1 - 0.9 10e3/uL   Eosinophils Absolute 0.2 0.0 - 0.5 10e3/uL   Basophils Absolute 0.0 0.0 - 0.1 10e3/uL   NEUT% 73.4 38.4 - 76.8 %   LYMPH% 15.0 14.0 - 49.7 %   MONO% 8.6 0.0 - 14.0 %   EOS% 2.3 0.0 - 7.0 %   BASO% 0.7 0.0 - 2.0 %  Comprehensive  metabolic panel     Status: Abnormal   Collection Time: 05/07/15 11:29 AM  Result Value Ref Range   Sodium 129 (L) 136 - 145 mEq/L   Potassium 3.4 (L) 3.5 - 5.1 mEq/L   Chloride 94 (L) 98 - 109 mEq/L   CO2 23 22 - 29 mEq/L   Glucose 86 70 - 140 mg/dl    Comment: Glucose reference range is for nonfasting patients. Fasting glucose reference range is 70- 100.   BUN 9.1 7.0 - 26.0 mg/dL   Creatinine 0.8 0.6 - 1.1 mg/dL   Total Bilirubin 0.46 0.20 - 1.20 mg/dL   Alkaline Phosphatase 177 (H) 40 - 150 U/L   AST 40 (H) 5 - 34 U/L   ALT 41 0 - 55 U/L   Total Protein 7.6 6.4 - 8.3 g/dL   Albumin 3.5 3.5 - 5.0 g/dL   Calcium 10.0 8.4 - 10.4 mg/dL   Anion Gap 13 (H) 3 - 11 mEq/L   EGFR 67 (L) >90 ml/min/1.73 m2    Comment: eGFR is calculated using the CKD-EPI Creatinine Equation (2009)    Radiology Reports: No results found.   Problem List  Principal Problem:   Left hemiparesis ([place]) Active Problems:   Cancer of hepatic flexure with obstruction s/p colectomy/ileostomy 07/09/2013   Nausea without vomiting   Anorexia   Iron deficiency anemia   Essential hypertension   Dilated bile duct   Assessment: This is a 82 year old Caucasian female with a past medical history of hypertension and colon cancer and newly diagnosed dilated biliary ducts with possible mass lesion near the ampulla on endoscopic ultrasound status post biopsy. She presents with left hemiparesis.  Plan: #1 left hemiparesis: Etiology is unclear. She does not have any facial asymmetry. Differential diagnosis is broad including CVA, intracranial metastatic lesion. Patient will need to undergo MRI of brain with and without contrast. If MRI of the brain is unremarkable, she will need further workup to find out the reason for her symptoms. She does mention low back pain. Her recent CT scan of the abdomen and pelvis was reviewed. No concerning osseous lesions were identified in that study per report. Patient denies any neck  pain.  #2 nausea with the recently diagnosed dilated biliary ducts with possible duodenal or ampullary mass: Underwent endoscopic ultrasound with biopsy at [facility] a few days  ago. Pathology is pending. Anti-emetics. Pain medications. Bilirubin is normal. Alk Phos is elevated.  #3 history of colon cancer, status post surgery and chemotherapy and 2015: No obvious recurrence noted on CT scan. Followed by oncology.   #4 history of essential hypertension: Continue to monitor blood pressures. Hydralazine as needed.  #5 Hyponatremia and dehydration: She'll be given IV fluids. Repeat labs tomorrow morning. PT and OT will be consulted.   DVT Prophylaxis: Lovenox Code Status: DO NOT RESUSCITATE per her conversation with her oncologist Family Communication: Discussed with the patient and her daughter  Disposition Plan: Admit to MedSurg   Further management decisions will depend on results of further testing and patient's response to treatment.   [facility]  [facility] Pager [phone]  If 7PM-7AM, please contact night-coverage [email or web address] Password [email or web address]  05/07/2015, 5:25 PM

## 2015-05-08 ENCOUNTER — Ambulatory Visit (HOSPITAL_COMMUNITY)
Admit: 2015-05-08 | Discharge: 2015-05-08 | Disposition: A | Discharge status: Home / Self Care | Payer: Medicare Other | Source: Ambulatory Visit | Attending: Radiation Oncology | Admitting: Radiation Oncology

## 2015-05-08 ENCOUNTER — Inpatient Hospital Stay (HOSPITAL_COMMUNITY): Payer: Medicare Other

## 2015-05-08 ENCOUNTER — Other Ambulatory Visit: Payer: Medicare Other

## 2015-05-08 DIAGNOSIS — E279 Disorder of adrenal gland, unspecified: Secondary | ICD-10-CM

## 2015-05-08 DIAGNOSIS — R609 Edema, unspecified: Secondary | ICD-10-CM

## 2015-05-08 DIAGNOSIS — C7931 Secondary malignant neoplasm of brain: Secondary | ICD-10-CM | POA: Insufficient documentation

## 2015-05-08 DIAGNOSIS — R918 Other nonspecific abnormal finding of lung field: Secondary | ICD-10-CM

## 2015-05-08 DIAGNOSIS — G8194 Hemiplegia, unspecified affecting left nondominant side: Secondary | ICD-10-CM

## 2015-05-08 DIAGNOSIS — R110 Nausea: Secondary | ICD-10-CM

## 2015-05-08 DIAGNOSIS — M549 Dorsalgia, unspecified: Secondary | ICD-10-CM

## 2015-05-08 DIAGNOSIS — Z85828 Personal history of other malignant neoplasm of skin: Secondary | ICD-10-CM

## 2015-05-08 DIAGNOSIS — L8990 Pressure ulcer of unspecified site, unspecified stage: Secondary | ICD-10-CM | POA: Insufficient documentation

## 2015-05-08 DIAGNOSIS — C801 Malignant (primary) neoplasm, unspecified: Secondary | ICD-10-CM | POA: Insufficient documentation

## 2015-05-08 DIAGNOSIS — R599 Enlarged lymph nodes, unspecified: Secondary | ICD-10-CM

## 2015-05-08 DIAGNOSIS — K828 Other specified diseases of gallbladder: Secondary | ICD-10-CM

## 2015-05-08 DIAGNOSIS — C7989 Secondary malignant neoplasm of other specified sites: Secondary | ICD-10-CM

## 2015-05-08 DIAGNOSIS — Z85038 Personal history of other malignant neoplasm of large intestine: Secondary | ICD-10-CM

## 2015-05-08 DIAGNOSIS — L89159 Pressure ulcer of sacral region, unspecified stage: Secondary | ICD-10-CM

## 2015-05-08 DIAGNOSIS — G8190 Hemiplegia, unspecified affecting unspecified side: Secondary | ICD-10-CM | POA: Insufficient documentation

## 2015-05-08 LAB — COMPREHENSIVE METABOLIC PANEL
ALT: 62 U/L — AB (ref 14–54)
AST: 70 U/L — ABNORMAL HIGH (ref 15–41)
Albumin: 3.2 g/dL — ABNORMAL LOW (ref 3.5–5.0)
Alkaline Phosphatase: 189 U/L — ABNORMAL HIGH (ref 38–126)
Anion gap: 11 (ref 5–15)
BUN: 8 mg/dL (ref 6–20)
CHLORIDE: 103 mmol/L (ref 101–111)
CO2: 20 mmol/L — ABNORMAL LOW (ref 22–32)
CREATININE: 0.57 mg/dL (ref 0.44–1.00)
Calcium: 9.1 mg/dL (ref 8.9–10.3)
Glucose, Bld: 89 mg/dL (ref 65–99)
POTASSIUM: 4 mmol/L (ref 3.5–5.1)
Sodium: 134 mmol/L — ABNORMAL LOW (ref 135–145)
Total Bilirubin: 1 mg/dL (ref 0.3–1.2)
Total Protein: 6.5 g/dL (ref 6.5–8.1)

## 2015-05-08 LAB — CBC
HCT: 35 % — ABNORMAL LOW (ref 36.0–46.0)
Hemoglobin: 11.4 g/dL — ABNORMAL LOW (ref 12.0–15.0)
MCH: 27.7 pg (ref 26.0–34.0)
MCHC: 32.6 g/dL (ref 30.0–36.0)
MCV: 85.2 fL (ref 78.0–100.0)
PLATELETS: 396 10*3/uL (ref 150–400)
RBC: 4.11 MIL/uL (ref 3.87–5.11)
RDW: 16 % — ABNORMAL HIGH (ref 11.5–15.5)
WBC: 5.3 10*3/uL (ref 4.0–10.5)

## 2015-05-08 MED ORDER — SODIUM CHLORIDE 0.9 % IV SOLN
8.0000 mg | Freq: Four times a day (QID) | INTRAVENOUS | Status: AC
  Administered 2015-05-08 – 2015-05-10 (×7): 8 mg via INTRAVENOUS
  Filled 2015-05-08 (×10): qty 4

## 2015-05-08 MED ORDER — DEXAMETHASONE SODIUM PHOSPHATE 4 MG/ML IJ SOLN
8.0000 mg | Freq: Two times a day (BID) | INTRAMUSCULAR | Status: DC
  Administered 2015-05-08 – 2015-05-10 (×6): 8 mg via INTRAVENOUS
  Filled 2015-05-08 (×6): qty 2

## 2015-05-08 MED ORDER — AMLODIPINE BESYLATE 5 MG PO TABS
5.0000 mg | ORAL_TABLET | Freq: Every day | ORAL | Status: DC
  Administered 2015-05-08 – 2015-05-11 (×4): 5 mg via ORAL
  Filled 2015-05-08 (×4): qty 1

## 2015-05-08 MED ORDER — TRAMADOL HCL 50 MG PO TABS: 50.0000 mg | ORAL_TABLET | Freq: Four times a day (QID) | ORAL | Status: DC | PRN

## 2015-05-08 MED ORDER — PROCHLORPERAZINE EDISYLATE 5 MG/ML IJ SOLN
10.0000 mg | Freq: Four times a day (QID) | INTRAMUSCULAR | Status: DC | PRN
  Administered 2015-05-09: 10 mg via INTRAVENOUS
  Filled 2015-05-08 (×2): qty 2

## 2015-05-08 NOTE — Evaluation (Signed)
Occupational Therapy Evaluation Patient Details Name: [name] MRN: [id number] DOB: 05-Aug-1924 Today's Date: 05/08/2015    History of Present Illness 82 yo female with h/o colon CA status post surgery and chemotherapy in 2015. Pt presents with 2-3 weeks of nausea, left sided weakness, and back pain. MRI revealed: 19 mm high right frontoparietal mass consistent with solitary metastasis.   Clinical Impression   Patient presenting with decreased ADL and functional mobility independence secondary to above. Patient independent PTA(2-3 weeks PTA). Patient currently functioning at an overall mod to max assist level, requiring +2 assist for OOB mobility mainly due to anxiety. Patient will benefit from acute OT to increase overall independence in the areas of ADLs, functional mobility, and overall safety in order to safely discharge to venue listed below.     Follow Up Recommendations  SNF;Supervision/Assistance - 24 hour    Equipment Recommendations  Other (comment) (TBD next venue of care)    Recommendations for Other Services   Possible Neuropsyc   Precautions / Restrictions Precautions Precautions: Fall Restrictions Weight Bearing Restrictions: No    Mobility Bed Mobility Overal bed mobility: Needs Assistance Bed Mobility: Supine to Sit;Sit to Supine     Supine to sit: Mod assist Sit to supine: Mod assist   General bed mobility comments: Assistance for BLEs and trunk control. Max encouragement to participate   Transfers General transfer comment: Did not occur, pt refused. Pt aprehensive and anxious about movement.     Balance Overall balance assessment: Needs assistance Sitting-balance support: Bilateral upper extremity supported;Feet supported Sitting balance-Leahy Scale: Poor   Postural control: Right lateral lean     Standing balance comment: did not occur    ADL Overall ADL's : Needs assistance/impaired Eating/Feeding: Set up;Bed level   Grooming: Set  up;Bed level Grooming Details (indicate cue type and reason): Pt too anxious to attempt grooming seated EOB unsupported  Upper Body Bathing: Bed level;Moderate assistance   Lower Body Bathing: Maximal assistance;Bed level   Upper Body Dressing : Bed level;Moderate assistance   Lower Body Dressing: Total assistance;Bed level     Toilet Transfer Details (indicate cue type and reason): did not occur, pt too nervous without 2 person assist   General ADL Comments: Pt very anxious about 1 person assisting her with any type of mobility, including bed mobility. Pt self-limiting and did not believe should could do most things. ADL best performed at bed level at this time.     Pertinent Vitals/Pain Pain Assessment: 0-10 Pain Score: 5  Pain Descriptors / Indicators: Aching Pain Intervention(s): Monitored during session     Hand Dominance Right   Extremity/Trunk Assessment Upper Extremity Assessment Upper Extremity Assessment: LUE deficits/detail LUE Deficits / Details: decreased AROM, weakness greater proximally. Grossly 2/5 MMT   Lower Extremity Assessment Lower Extremity Assessment: Defer to PT evaluation   Cervical / Trunk Assessment Cervical / Trunk Assessment: Kyphotic   Communication Communication Communication: No difficulties   Cognition Arousal/Alertness: Awake/alert Behavior During Therapy: WFL for tasks assessed/performed Overall Cognitive Status: Within Functional Limits for tasks assessed (however pt with difficult time describing timeline of events)             Home Living Family/patient expects to be discharged to:: Private residence Living Arrangements: Alone Available Help at Discharge:  (none; son and daughter have full time jobs) Type of Home: House Home Access: Stairs to enter [employer] of Steps: 1 - side, 4 - front Entrance Stairs-Rails: Left (side) Home Layout: One level  Bathroom Shower/Tub: Insurance underwriter: Standard     Home Equipment: Museum/gallery conservator - 2 wheels;Bedside commode   Prior Functioning/Environment Level of Independence: Independent  Comments: pt independent prior to weakness that started ~2-3 weeks ago     OT Diagnosis: Generalized weakness;Cognitive deficits;Acute pain;Hemiplegia non-dominant side   OT Problem List: Decreased strength;Decreased range of motion;Decreased activity tolerance;Impaired balance (sitting and/or standing);Decreased coordination;Decreased cognition;Decreased safety awareness;Decreased knowledge of use of DME or AE;Decreased knowledge of precautions;Pain   OT Treatment/Interventions: Self-care/ADL training;Therapeutic exercise;Energy conservation;DME and/or AE instruction;Therapeutic activities;Patient/family education;Balance training    OT Goals(Current goals can be found in the care plan section) Acute Rehab OT Goals Patient Stated Goal: go to rehab OT Goal Formulation: With patient Time For Goal Achievement: 05/22/15 Potential to Achieve Goals: Good ADL Goals Pt Will Perform Grooming: with set-up;sitting (EOB, unsupported) Pt Will Perform Lower Body Bathing: sitting/lateral leans;with min assist (seated EOB) Pt Will Perform Lower Body Dressing: with min assist;sitting/lateral leans (seated EOB) Pt Will Transfer to Toilet: with min assist;stand pivot transfer;bedside commode Additional ADL Goal #1: Pt will be supervision for bed mobility in order to perform ADL seated EOB Additional ADL Goal #2: Pt will be min assist for sit to/from stands as a precursor for ADLs   OT Frequency: Min 3X/week   Barriers to D/C: Decreased caregiver support   End of Session Nurse Communication: Mobility status  Activity Tolerance: Other (comment) (limited by anxiety) Patient left: in bed;with call bell/phone within reach;with bed alarm set   Time: [id number] OT Time Calculation (min): 32 min Charges:  OT General Charges $OT Visit: 1  Procedure OT Evaluation $OT Eval Moderate Complexity: 1 Procedure OT Treatments $Self Care/Home Management : 8-22 mins  Chrys Racer , MS, OTR/L, CLT Pager: [phone]  05/08/2015, 3:39 PM

## 2015-05-08 NOTE — Consult Note (Signed)
Radiation Oncology         [phone] ________________________________  Name: [name] MRN: [id number]  Date: 05/07/2015  DOB: 1925-02-26  [id number]  No ref. provider found     REFERRING PHYSICIAN: No ref. provider found   DIAGNOSIS: The primary encounter diagnosis was Metastasis to brain [facility]). A diagnosis of Hemiplegia ([place]) was also pertinent to this visit.   HISTORY OF PRESENT ILLNESS:[name] is a 82 y.o. female seen at the request of Dr. Learta Codding. She has a history of Stage IIIB poorly differentiated adenocarcinoma of the colon and was treated surgically in May 2015 with subsequent adjuvant Xeloda until November 2015. She remianed NED until January 2017 when she developed nausea, vomiting, and anorexia. A CT and MRI of the abdomen confirmed a new lesion along the common bile duct with narrowing of the distal pancreatic duct. She did undergo EUS with biopsy at [facility], and pathology is pending at the time of dictation. She has noticed increasing left hemiplegia over the last month to the point where in the last week or two she has been unable to care for herself at home. She presented to the hospital after she was seen by Dr. Benay Spice. She underwent an MRI this morning revealing a 37mm lesion along the right frontal parietal lobe along the upper motor strip. Moderate vasogenic edema is present without mass effect. Presently, she continues to have nausea and is going to receive scheduled antiemetics for the next 48 hours, she remains on Dexamethasone 8 mg BID IV. We are asked to see the patient for consideration of the role of SRS versus palliative whole brain radiation.     PREVIOUS RADIATION THERAPY: No   PAST MEDICAL HISTORY:  Past Medical History  Diagnosis Date  . Hypertension   . Hyperlipidemia   . Arthritis   . Cancer [facility])     colon       PAST SURGICAL HISTORY: Past Surgical History  Procedure Laterality Date  . Total hip arthroplasty    .  Colonoscopy with propofol N/A 07/08/2013    Procedure: COLONOSCOPY WITH PROPOFOL;  Surgeon: Wonda Horner, MD;  Location: [facility] ENDOSCOPY;  Service: Endoscopy;  Laterality: N/A;  . Partial colectomy Right 07/09/2013    Procedure: PARTIAL COLECTOMY AND EXCISION OF NEVUS FROM RIGHT ABDOMINAL WALL;  Surgeon: Earnstine Regal, MD;  Location: [facility] ORS;  Service: General;  Laterality: Right;  . Colon surgery    . Eye surgery      bilateral cataract with lens implants  . Ileostomy closure N/A 03/23/2014    Procedure: ILEOSTOMY CLOSURE;  Surgeon: Armandina Gemma, MD;  Location: [facility] ORS;  Service: General;  Laterality: N/A;  . Parastomal hernia repair N/A 03/23/2014    Procedure: HERNIA REPAIR PARASTOMAL;  Surgeon: Armandina Gemma, MD;  Location: [facility] ORS;  Service: General;  Laterality: N/A;     FAMILY HISTORY:  Family History  Problem Relation Age of Onset  . Lung cancer Sister      SOCIAL HISTORY:  reports that she has never smoked. She has never used smokeless tobacco. She reports that she does not drink alcohol or use illicit drugs. She states that she has been living independently and has two adult children who live locally.    ALLERGIES: Review of patient's allergies indicates no known allergies.   MEDICATIONS:  Current Facility-Administered Medications  Medication Dose Route Frequency Provider Last Rate Last Dose  . acetaminophen (TYLENOL) tablet 650 mg  650 mg Oral Q6H PRN Gokul  [place] Pink, MD       Or  . acetaminophen (TYLENOL) suppository 650 mg  650 mg Rectal Q6H PRN Bonnielee Haff, MD      . amLODipine (NORVASC) tablet 5 mg  5 mg Oral Daily Eugenie Filler, MD   5 mg at 05/08/15 1735  . aspirin EC tablet 81 mg  81 mg Oral Daily Bonnielee Haff, MD   81 mg at 05/08/15 1021  . dexamethasone (DECADRON) injection 8 mg  8 mg Intravenous Q12H Ladell Pier, MD   8 mg at 05/08/15 1251  . enoxaparin (LOVENOX) injection 40 mg  40 mg Subcutaneous Q24H Bonnielee Haff, MD   40 mg at 05/08/15 1735  . hydrALAZINE  (APRESOLINE) injection 10 mg  10 mg Intravenous Q6H PRN Bonnielee Haff, MD      . HYDROcodone-acetaminophen (NORCO/VICODIN) 5-325 MG per tablet 1 tablet  1 tablet Oral Q6H PRN Bonnielee Haff, MD   1 tablet at 05/08/15 0758  . LORazepam (ATIVAN) tablet 0.25 mg  0.25 mg Oral Q6H PRN Bonnielee Haff, MD      . morphine 2 MG/ML injection 2-4 mg  2-4 mg Intravenous Q2H PRN Bonnielee Haff, MD   4 mg at 05/08/15 1944  . multivitamin with minerals tablet 1 tablet  1 tablet Oral Daily Bonnielee Haff, MD   1 tablet at 05/08/15 1021  . ondansetron (ZOFRAN) 8 mg in sodium chloride 0.9 % 50 mL IVPB  8 mg Intravenous 4 times per day Eugenie Filler, MD   8 mg at 05/08/15 1735  . prochlorperazine (COMPAZINE) injection 10 mg  10 mg Intravenous Q6H PRN Eugenie Filler, MD      . traMADol Veatrice Bourbon) tablet 50 mg  50 mg Oral Q6H PRN Eugenie Filler, MD         REVIEW OF SYSTEMS: On review of systems, the patient states that she continues to have low back pain and nausea. She has not had an appetite in multiple weeks and admits to at least 10 pounds of weight loss. She denies any chest pain, shortness of breath, fevers, or chills. She denies any urinary disturbances. She notes constipation as a result of her narcotic pain medication. She has not been able to walk on her own in about a week or so. She reports decreased sensation of her left extremities. She is only able to hold small items in her hand, and is unable to lift her arm for more than a few seconds before it falls. She is able to lift her left leg at the level of the hip but cannot wiggle her toes.A complete review of systems is obtained and is otherwise negative.    PHYSICAL EXAM:  height is 5\' 4"  (1.626 m) and weight is 121 lb 11.1 oz (55.2 kg). Her oral temperature is 98.6 F (37 C). Her blood pressure is 143/78 and her pulse is 89. Her respiration is 18 and oxygen saturation is 99%.   Pain scale 6/10 In general this is a caucasian female who appears  younger than her stated age. She is alert and oriented x4 and appropriate throughout the examination. HEENT reveals that the patient is normocephalic, atraumatic. EOMs are intact. PERRLA. Skin is intact without any evidence of gross lesions. Cardiovascular exam reveals a regular rate and rhythm, no clicks rubs or murmurs are auscultated. Chest is clear to auscultation bilaterally. Lymphatic assessment is performed and does not reveal any adenopathy in the cervical, supraclavicular, axillary, or inguinal chains. Abdomen has active  bowel sounds in all quadrants and is intact. The abdomen is soft, non tender, non distended. Lower extremities are negative for pretibial pitting edema, deep calf tenderness, cyanosis or clubbing. She is unable to lift her left arm for more than 3 seconds without this falling away from her body. She is unable to lift her left leg without relying on her quadracepts muscle. She is unable to wiggle her toes, and has limited sensation with light touch on the left side of her body.   ECOG = 4  0 - Asymptomatic (Fully active, able to carry on all predisease activities without restriction)  1 - Symptomatic but completely ambulatory (Restricted in physically strenuous activity but ambulatory and able to carry out work of a light or sedentary nature. For example, light housework, office work)  2 - Symptomatic, <50% in bed during the day (Ambulatory and capable of all self care but unable to carry out any work activities. Up and about more than 50% of waking hours)  3 - Symptomatic, >50% in bed, but not bedbound (Capable of only limited self-care, confined to bed or chair 50% or more of waking hours)  4 - Bedbound (Completely disabled. Cannot carry on any self-care. Totally confined to bed or chair)  5 - Death   Eustace Pen MM, Creech RH, Tormey DC, et al. [phone]). "Toxicity and response criteria of the [facility]". [place] Oncol. 5 (6): 649-55    LABORATORY  DATA:  Lab Results  Component Value Date   WBC 5.3 05/08/2015   HGB 11.4* 05/08/2015   HCT 35.0* 05/08/2015   MCV 85.2 05/08/2015   PLT 396 05/08/2015   Lab Results  Component Value Date   NA 134* 05/08/2015   K 4.0 05/08/2015   CL 103 05/08/2015   CO2 20* 05/08/2015   Lab Results  Component Value Date   ALT 62* 05/08/2015   AST 70* 05/08/2015   ALKPHOS 189* 05/08/2015   BILITOT 1.0 05/08/2015      RADIOGRAPHY: Mr [name] Wo Contrast  05/08/2015  CLINICAL DATA:  Left hemi paresis. History of colon cancer. Metastasis versus infarct. EXAM: MRI HEAD WITHOUT AND WITH CONTRAST TECHNIQUE: Multiplanar, multiecho pulse sequences of the brain and surrounding structures were obtained without and with intravenous contrast. CONTRAST:  10 cc MultiHance intravenous COMPARISON:  None. FINDINGS: Calvarium and upper cervical spine: No definitive skeletal metastasis. Orbits: Bilateral cataract resection.  Negative for mass Sinuses and Mastoids: Clear. Brain: There is an enhancing intra-axial mass in the parasagittal right frontal parietal lobe along the upper motor strip measuring 19 mm. Moderate vasogenic edema without shift or herniation. No other masses identified. MR signal shows dense cellularity. No definitive hemorrhagic component. No acute infarct, hemorrhage, hydrocephalus, or major vessel occlusion. Metastasis is unexpected finding, but to accelerate care these results will be called to the ordering clinician or representative by the Radiologist Assistant, and communication documented in the PACS or zVision Dashboard. IMPRESSION: 19 mm high right frontoparietal mass consistent with solitary metastasis. Moderate vasogenic edema without shift. Electronically Signed   By: Monte Fantasia M.D.   On: 05/08/2015 11:40   Mr Abdomen W Wo Contrast  04/10/2015  CLINICAL DATA:  Nausea and LEFT flank pain. Biliary duct dilatation in on recent CT exam. History of colorectal carcinoma. EXAM: MRI ABDOMEN  WITHOUT AND WITH CONTRAST TECHNIQUE: Multiplanar multisequence MR imaging of the abdomen was performed both before and after the administration of intravenous contrast. CONTRAST:  40mL MULTIHANCE GADOBENATE DIMEGLUMINE 529  MG/ML IV SOLN COMPARISON:  CT 03/30/2015, 09/04/2014 FINDINGS: Lower chest:  Lung bases are clear. Hepatobiliary: There is intrahepatic and extrahepatic biliary duct dilatation. There is fusiform dilatation of the common hepatic duct and common bile duct down to the distal common bile duct. The common hepatic duct measures 17 mm and the common bile duct measures 15 mm. There is tight stricturing of the most distal common bile duct over a 9 mm segment. Caliber changes from a 15 mm common bile duct to a 2 mm bile duct through this region of distal stricturing. There are no filling defects within the common bile duct. In axial projection the 9 mm of distal common bile duct stricturing is circumferential. The gallbladder is mildly distended at 4.7 cm. No gallstones evident within the gallbladder. There is no discrete hepatic lesion. Pancreas: The pancreatic duct is dilated to 6 mm (image 39, series 7). The duct dilatation extends from the ampullary region extends through the body and tail. No evidence pancreatic atrophy. There is no discrete mass within the pancreatic head. There is a periampullary duodenum diverticulum in the vicinity of the distal obstruction measuring 15 mm (image 25, series 13). The narrowing of the distal common bile duct is circumferential as seen on image 50, series 17. The circumferential narrowing is also seen on image 30, series 23 coronal imaging Spleen: Normal spleen. Adrenals/urinary tract: Adrenal glands and kidneys are normal. Stomach/Bowel: Stomach and limited of the small bowel is unremarkable Vascular/Lymphatic: Abdominal aortic normal caliber. No retroperitoneal periportal lymphadenopathy. Musculoskeletal: No aggressive osseous lesion IMPRESSION: 1. Circumferential  narrowing of the most distal 9 mm of the common bile duct with severe biliary ductal dilatation proximal to this distal narrowing. Differential includes inflammatory stricturing, primary pancreatic neoplasm, or primary biliary neoplasm. As no biliary duct dilatation was evident on CT of 09/04/2014, findings are most concerning for neoplasm. 2. Mlid obstruction of the distal pancreatic duct with differential as above. 3. Small periampullary duodenal diverticulum. Electronically Signed   By: Suzy Bouchard M.D.   On: 04/10/2015 08:27       IMPRESSION:  82 year old female with a metastatic brain lesion resulting in hemiparesis, consistent with her history of adenocarcinoma of the colon.   PLAN: The patient is counseled on the role of radiotherapy for treatment of what appears to be her adenocarcinoma of the colon now in the brain. Due to the oligometastatic finding, it would be reasonable to proceed with SRS to the lesion rather than whole brain radiation. I discussed with the patient that we would be hopeful of her regaining some of her motor function and sensation, though this could not be guaranteed. She states an understanding of this. We have started the process of involving our multidisciplinary team which will lead to neurosurgery consultation, CT simulation planning in our department, and we have reviewed the risks, benefits, and treatment strategy with the patient. She is interested in moving forward, and we will move swiftly in coordinating her care. She remains on steroids and is requiring scheduled antiemetics as well. We will follow this closely.   Carola Rhine, PAC

## 2015-05-08 NOTE — Progress Notes (Signed)
Initial Nutrition Assessment  DOCUMENTATION CODES:   Severe malnutrition in context of chronic illness  INTERVENTION:   -Once nausea has improved, provide Ensure Enlive po BID, each supplement provides 350 kcal and 20 grams of protein -Encourage PO intake -RD to continue to monitor for nutritional needs  NUTRITION DIAGNOSIS:   Malnutrition related to chronic illness as evidenced by percent weight loss, energy intake < or equal to 75% for > or equal to 1 month, severe depletion of body fat, severe depletion of muscle mass.  GOAL:   Patient will meet greater than or equal to 90% of their needs  MONITOR:   PO intake, Labs, Weight trends, Skin, I & O's  REASON FOR ASSESSMENT:   Malnutrition Screening Tool    ASSESSMENT:   82 y.o. female with a past medical history of colon cancer, status post surgery and chemotherapy in 2015. She had reversal of her colostomy at that time. She also has a history of hypertension. She was in her usual state of health till 2-3 months ago when she started developing nausea. And she also had abdominal discomfort. She was seen by a gastroenterologist. She underwent EGD was apparently unremarkable. She subsequently underwent a CT scan of her abdomen, pelvis, which showed dilated bile ducts. This prompted an MRI of her abdomen, which revealed significantly dilated biliary ducts. There was some concern about a pancreatic or biliary tumor.  Patient has been followed by [place] RD for previous diagnosis of colon cancer.   Patient in room with no family at bedside. Pt reports persistent nausea (no vomiting)for the past 2 months (since January) which has caused a reported 12 lb weight loss. Pt states her UBW is 147 lb.  Per weight history, pt has lost 19 lb since 12/19 (14% wt loss x 2 months, significant for time frame). Patient tried to consume nutritional supplements but was unable to tolerate them and she states that some days she may have bacon/eggs for  breakfast but nothing else for the rest of the day. Per RN at bedside, pt ate 1-2 bites of muffin this morning. States that "everything tastes terrible". She denies any swallowing or chewing issues. Pt also with a newly developed sacral ulcer. Pt is agreeable to receiving Ensure supplements once the nausea is better controlled. Pt to have MRI of brain today.  Pt presents with severe muscle and fat depletion.   Medications: MVI daily, Zofran PRN Labs reviewed: Low Na  Diet Order:  Diet Heart Room service appropriate?: Yes; Fluid consistency:: Thin  Skin:  Wound (see comment) (Stage II sacral ulcer)  Last BM:  2/25  Height:   Ht Readings from Last 1 Encounters:  05/07/15 5\' 4"  (1.626 m)    Weight:   Wt Readings from Last 1 Encounters:  05/07/15 121 lb 11.1 oz (55.2 kg)    Ideal Body Weight:  54.5 kg  BMI:  Body mass index is 20.88 kg/(m^2).  Estimated Nutritional Needs:   Kcal:  1500-1700  Protein:  75-85g  Fluid:  1.7L/day  EDUCATION NEEDS:   No education needs identified at this time  Clayton Bibles, MS, RD, LDN Pager: [phone] After Hours Pager: [phone]

## 2015-05-08 NOTE — Progress Notes (Signed)
Utilization Review Complete.  Pt will be followed for disposition.

## 2015-05-08 NOTE — Progress Notes (Signed)
IP PROGRESS NOTE  Subjective:   She continues to have left-sided weakness, nausea, and back pain.  Objective: Vital signs in last 24 hours: Blood pressure 158/78, pulse 85, temperature 98.5 F (36.9 C), temperature source Oral, resp. rate 16, height 5\' 4"  (1.626 m), weight 121 lb 11.1 oz (55.2 kg), SpO2 98 %.  Intake/Output from previous day: 02/27 0701 - 02/28 0700 In: -  Out: 200 [Urine:200]  Physical Exam:   Abdomen: Soft, no mass, nontender Neurologic: Alert and oriented, left arm/hand and leg weakness    Lab Results:  Recent Labs  05/07/15 1129 05/08/15 0329  WBC 6.9 5.3  HGB 12.5 11.4*  HCT 37.8 35.0*  PLT 452* 396    BMET  Recent Labs  05/07/15 1129 05/08/15 0329  NA 129* 134*  K 3.4* 4.0  CL  --  103  CO2 23 20*  GLUCOSE 86 89  BUN 9.1 8  CREATININE 0.8 0.57  CALCIUM 10.0 9.1    Studies/Results: Mr Kizzie Fantasia Contrast  05/08/2015  CLINICAL DATA:  Left hemi paresis. History of colon cancer. Metastasis versus infarct. EXAM: MRI HEAD WITHOUT AND WITH CONTRAST TECHNIQUE: Multiplanar, multiecho pulse sequences of the brain and surrounding structures were obtained without and with intravenous contrast. CONTRAST:  10 cc MultiHance intravenous COMPARISON:  None. FINDINGS: Calvarium and upper cervical spine: No definitive skeletal metastasis. Orbits: Bilateral cataract resection.  Negative for mass Sinuses and Mastoids: Clear. Brain: There is an enhancing intra-axial mass in the parasagittal right frontal parietal lobe along the upper motor strip measuring 19 mm. Moderate vasogenic edema without shift or herniation. No other masses identified. MR signal shows dense cellularity. No definitive hemorrhagic component. No acute infarct, hemorrhage, hydrocephalus, or major vessel occlusion. Metastasis is unexpected finding, but to accelerate care these results will be called to the ordering clinician or representative by the Radiologist Assistant, and communication  documented in the PACS or zVision Dashboard. IMPRESSION: 19 mm high right frontoparietal mass consistent with solitary metastasis. Moderate vasogenic edema without shift. Electronically Signed   By: Monte Fantasia M.D.   On: 05/08/2015 11:40    Medications: I have reviewed the patient's current medications.  Assessment/Plan: 1. Stage IIIB (T3 N2a) poorly different it adenocarcinoma of the right colon, status post a right colectomy and end ileostomy 07/09/2013.  4 of 15 lymph nodes positive for metastatic carcinoma and 2 tumor deposits.   Cycle 1 adjuvant Xeloda 08/15/2013.   Cycle 2 adjuvant Xeloda 09/05/2013.   Cycle 3 adjuvant Xeloda with a dose reduction 10/03/2013.   Cycle 4 adjuvant Xeloda 10/25/2013.   Cycle 5 adjuvant Xeloda 11/16/2013.  Cycle 6 adjuvant Xeloda 12/06/2013  Cycle 7 of adjuvant Xeloda 12/27/2013  Cycle 8 of adjuvant Xeloda 01/17/2014.  Restaging CT scans 02/14/2014 with stable 4 to 5 mm right lower lobe pulmonary nodule; interval enlargement of left periaortic lymphadenopathy.  Ileostomy takedown 03/23/2014  Restaging CT scan 09/04/2014 with stable small pulmonary nodules. Stable and smaller mesenteric and retroperitoneal lymph nodes. No evidence of metastatic disease. 2. History of multiple nonmelanoma skin cancers. 3. History of hypertension. 4. Indeterminate 4 mm right lung base nodule on the CT abdomen 07/06/2013.  Chest CT 08/05/2013 with middle and right lower lobe nodules measuring up to 4 mm. Repeat chest CT planned at a 6-9 month interval.  Restaging chest CT 02/14/2014 with stable 4-5 mm right lower lobe pulmonary nodule. 5. Rash secondary to Xeloda. Resolved 6. 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 Resolved. 7. Iron deficiency anemia-stool  Hemoccult positive 12/20/2014, colonoscopy 01/04/2015 negative other than diverticulosis in the sigmoid colon 8. Nausea/back pain/anorexia. Symptoms persist. CT chest/abdomen/pelvis  03/30/2015 with 2 tiny stable right lung nodules; new mild diffuse intrahepatic biliary ductal dilatation; no liver mass; new prominent gallbladder distention; new prominently dilated common bile duct; thickening and heterogeneity of the pancreatic head with new peripancreatic fat stranding extending into the central mesentery and anterior paranephric retroperitoneal spaces bilaterally; new main pancreatic duct dilatation; new diffuse left adrenal thickening was suggestion of a new 1.2 x 1.1 cm left adrenal nodule; new upper retroperitoneal lymphadenopathy.  MRI abdomen 04/09/2015 confirmed circumferential narrowing of the distal common bile duct and narrowing of the distal pancreatic duct. Severe biliary ductal dilatation  EUS 05/04/2015 confirmed a dilated common bile duct, mass near the ampulla, FNA biopsy-pathology pending  9. Left hemiplegia-MRI brain 05/08/2015 confirmed an isolated right frontoparietal metastasis with edema  10. Sacral decubitus ulcer   [name] has a history of colon cancer. She now has evidence of recurrent disease at the upper abdomen and right brain. I suspect her nausea may be in large part secondary to the brain metastasis with associated edema. I discussed the case with Dr. Watt Climes earlier today. He thinks it is unlikely her symptoms will improve with placement of a bile duct stent. He will see her tomorrow.  I discussed the MRI findings with [name].  Recommendations: 1. Begin Decadron 2. Radiation oncology consultation to consider SRS or standard fractionated radiation 3. Follow-up on pathology report from the duodenal biopsy at [facility] 4. Skilled nursing facility placement at discharge 5. Hospice referral if metastatic colon cancer is confirmed on the [facility] biopsy  LOS: 1 day   Betsy Coder, MD   05/08/2015, 1:40 PM

## 2015-05-08 NOTE — Progress Notes (Signed)
PT Cancellation Note  Patient Details Name: [name] MRN: [id number] DOB: [birth year]   Cancelled Treatment:    Reason Eval/Treat Not Completed: Patient at procedure or test/unavailable.  Pt being taken off of the unit for MRI.  Will check back as schedule permits to complete evaluation.   [name] LUBECK 05/08/2015, 10:47 AM

## 2015-05-08 NOTE — Progress Notes (Signed)
[facility]  Patient Status: Active (receiving services up to time of hospitalization)  [facility] is providing the following services: RN and PT and HHA  If patient discharges after hours, please call [phone].   Edwinna Areola 05/08/2015, 12:38 PM

## 2015-05-08 NOTE — Progress Notes (Signed)
[facility] PROGRESS NOTE  [name] [id number] DOB: [birth year] DOA: 05/07/2015 PCP: Melinda Crutch  Assessment/Plan: #1 left hemiparesis secondary to metastatic disease Secondary to metastatic disease noted on MRI head which shows a 19 mm high right frontoparietal mass consistent with solitary metastases. Moderate vasogenic edema without shift. Patient has been started on IV Decadron. Radiation oncology have been consulted to evaluate for radiation treatments. Will need to follow-up on pathology report from duodenal biopsy at [facility]. PT/OT. Will likely require skilled nursing facility. Oncology following.  #2 history of stage IIIB poorly differentiated adenocarcinoma of the right colon Possible recurrent disease at the upper abdomen and the right brain. See problem #1. On need to follow-up on duodenal biopsy pathology results from [facility]. Per oncology.  #3 nausea Likely secondary to metastases. Patient started on Decadron. We'll place on scheduled Zofran 8 mg IV every 6 hours 48 hours. Compazine when necessary.  #4 hypertension Stable. Resume home dose Norvasc. Hydralazine as needed.  #5 prophylaxis Lovenox for DVT prophylaxis.  Code Status: DO NOT RESUSCITATE Family Communication: Updated patient. No family at bedside. Disposition Plan: Per oncology.   Consultants:  Oncology: Dr. Benay Spice 05/07/2015  Procedures:  MRI head 05-17-15  Antibiotics:  None  HPI/Subjective: Patient still with left hemiparesis. Patient c/o nausea. No emesis. No SOB. No CP.  Objective: Filed Vitals:   05-17-15 0445 05/17/2015 1350  BP: 158/78 143/78  Pulse: 85 89  Temp: 98.5 F (36.9 C) 98.6 F (37 C)  Resp: 16 18    Intake/Output Summary (Last 24 hours) at 05-17-15 1543 Last data filed at 05/17/2015 1000  Gross per 24 hour  Intake    240 ml  Output    200 ml  Net     40 ml   Filed Weights   05/07/15 1447  Weight: 55.2 kg (121 lb 11.1 oz)     Exam:   General:  NAD  Cardiovascular: RRR  Respiratory: CTAB  Abdomen: Soft/diffuse abdominal pain/ND/+BS  Musculoskeletal: No c/c/e  Neuro: Patient with left hemiparesis   Data Reviewed: Basic Metabolic Panel:  Recent Labs Lab 05/07/15 1129 2015-05-17 0329  NA 129* 134*  K 3.4* 4.0  CL  --  103  CO2 23 20*  GLUCOSE 86 89  BUN 9.1 8  CREATININE 0.8 0.57  CALCIUM 10.0 9.1   Liver Function Tests:  Recent Labs Lab 05/07/15 1129 May 17, 2015 0329  AST 40* 70*  ALT 41 62*  ALKPHOS 177* 189*  BILITOT 0.46 1.0  PROT 7.6 6.5  ALBUMIN 3.5 3.2*   No results for input(s): LIPASE, AMYLASE in the last 168 hours. No results for input(s): AMMONIA in the last 168 hours. CBC:  Recent Labs Lab 05/07/15 1129 05/17/2015 0329  WBC 6.9 5.3  NEUTROABS 5.0  --   HGB 12.5 11.4*  HCT 37.8 35.0*  MCV 84.4 85.2  PLT 452* 396   Cardiac Enzymes: No results for input(s): CKTOTAL, CKMB, CKMBINDEX, TROPONINI in the last 168 hours. BNP (last 3 results) No results for input(s): BNP in the last 8760 hours.  ProBNP (last 3 results) No results for input(s): PROBNP in the last 8760 hours.  CBG: No results for input(s): GLUCAP in the last 168 hours.  No results found for this or any previous visit (from the past 240 hour(s)).   Studies: Mr Kizzie Fantasia Contrast  May 17, 2015  CLINICAL DATA:  Left hemi paresis. History of colon cancer. Metastasis versus infarct. EXAM: MRI HEAD WITHOUT AND WITH CONTRAST TECHNIQUE: Multiplanar,  multiecho pulse sequences of the brain and surrounding structures were obtained without and with intravenous contrast. CONTRAST:  10 cc MultiHance intravenous COMPARISON:  None. FINDINGS: Calvarium and upper cervical spine: No definitive skeletal metastasis. Orbits: Bilateral cataract resection.  Negative for mass Sinuses and Mastoids: Clear. Brain: There is an enhancing intra-axial mass in the parasagittal right frontal parietal lobe along the upper motor strip  measuring 19 mm. Moderate vasogenic edema without shift or herniation. No other masses identified. MR signal shows dense cellularity. No definitive hemorrhagic component. No acute infarct, hemorrhage, hydrocephalus, or major vessel occlusion. Metastasis is unexpected finding, but to accelerate care these results will be called to the ordering clinician or representative by the Radiologist Assistant, and communication documented in the PACS or zVision Dashboard. IMPRESSION: 19 mm high right frontoparietal mass consistent with solitary metastasis. Moderate vasogenic edema without shift. Electronically Signed   By: Monte Fantasia M.D.   On: 05/08/2015 11:40    Scheduled Meds: . aspirin EC  81 mg Oral Daily  . dexamethasone  8 mg Intravenous Q12H  . enoxaparin (LOVENOX) injection  40 mg Subcutaneous Q24H  . LORazepam  0.5 mg Intravenous Once  . multivitamin with minerals  1 tablet Oral Daily   Continuous Infusions:   Principal Problem:   Left hemiparesis (HCC) Active Problems:   Cancer of hepatic flexure with obstruction s/p colectomy/ileostomy 07/09/2013   Nausea without vomiting   Anorexia   Iron deficiency anemia   Essential hypertension   Dilated bile duct   Pressure ulcer    Time spent: 35 mins    [facility] MD [facility] Pager [phone]. If 7PM-7AM, please contact night-coverage at [email or web address], password [facility] 05/08/2015, 3:43 PM  LOS: 1 day

## 2015-05-09 ENCOUNTER — Ambulatory Visit
Admit: 2015-05-09 | Discharge: 2015-05-09 | Disposition: A | Discharge status: Home / Self Care | Payer: Medicare Other | Attending: Radiation Oncology | Admitting: Radiation Oncology

## 2015-05-09 DIAGNOSIS — Z510 Encounter for antineoplastic radiation therapy: Secondary | ICD-10-CM | POA: Insufficient documentation

## 2015-05-09 DIAGNOSIS — C801 Malignant (primary) neoplasm, unspecified: Secondary | ICD-10-CM | POA: Insufficient documentation

## 2015-05-09 DIAGNOSIS — C182 Malignant neoplasm of ascending colon: Secondary | ICD-10-CM

## 2015-05-09 DIAGNOSIS — C7931 Secondary malignant neoplasm of brain: Secondary | ICD-10-CM | POA: Insufficient documentation

## 2015-05-09 DIAGNOSIS — K838 Other specified diseases of biliary tract: Secondary | ICD-10-CM

## 2015-05-09 LAB — CBC
HEMATOCRIT: 36.2 % (ref 36.0–46.0)
Hemoglobin: 12 g/dL (ref 12.0–15.0)
MCH: 28.2 pg (ref 26.0–34.0)
MCHC: 33.1 g/dL (ref 30.0–36.0)
MCV: 85.2 fL (ref 78.0–100.0)
Platelets: 418 10*3/uL — ABNORMAL HIGH (ref 150–400)
RBC: 4.25 MIL/uL (ref 3.87–5.11)
RDW: 16 % — ABNORMAL HIGH (ref 11.5–15.5)
WBC: 4.1 10*3/uL (ref 4.0–10.5)

## 2015-05-09 LAB — BASIC METABOLIC PANEL
ANION GAP: 13 (ref 5–15)
BUN: 9 mg/dL (ref 6–20)
CHLORIDE: 101 mmol/L (ref 101–111)
CO2: 20 mmol/L — AB (ref 22–32)
Calcium: 9.7 mg/dL (ref 8.9–10.3)
Creatinine, Ser: 0.64 mg/dL (ref 0.44–1.00)
GFR calc Af Amer: >60 mL/min (ref >60)
GFR calc non Af Amer: >60 mL/min (ref >60)
GLUCOSE: 139 mg/dL — AB (ref 65–99)
POTASSIUM: 4.2 mmol/L (ref 3.5–5.1)
Sodium: 134 mmol/L — ABNORMAL LOW (ref 135–145)

## 2015-05-09 MED ORDER — LORAZEPAM 2 MG/ML IJ SOLN
0.5000 mg | Freq: Once | INTRAMUSCULAR | Status: AC
  Administered 2015-05-10: 0.5 mg via INTRAVENOUS
  Filled 2015-05-09: qty 1

## 2015-05-09 NOTE — Progress Notes (Signed)
IP PROGRESS NOTE  Subjective:   The left-sided weakness is unchanged. She continues to have nausea.  Objective: Vital signs in last 24 hours: Blood pressure 139/77, pulse 91, temperature 98.1 F (36.7 C), temperature source Oral, resp. rate 16, height 5\' 4"  (1.626 m), weight 121 lb 11.1 oz (55.2 kg), SpO2 100 %.  Intake/Output from previous day: 02/28 0701 - 03/01 0700 In: 1262 [P.O.:1100; IV Piggyback:162] Out: 950 [Urine:950]  Physical Exam:   Neurologic: Alert and oriented, left arm/hand and leg weakness    Lab Results:  Recent Labs  05/08/15 0329 05/09/15 0327  WBC 5.3 4.1  HGB 11.4* 12.0  HCT 35.0* 36.2  PLT 396 418*    BMET  Recent Labs  05/08/15 0329 05/09/15 0327  NA 134* 134*  K 4.0 4.2  CL 103 101  CO2 20* 20*  GLUCOSE 89 139*  BUN 8 9  CREATININE 0.57 0.64  CALCIUM 9.1 9.7    Studies/Results: Mr Kizzie Fantasia Contrast  05/08/2015  CLINICAL DATA:  Left hemi paresis. History of colon cancer. Metastasis versus infarct. EXAM: MRI HEAD WITHOUT AND WITH CONTRAST TECHNIQUE: Multiplanar, multiecho pulse sequences of the brain and surrounding structures were obtained without and with intravenous contrast. CONTRAST:  10 cc MultiHance intravenous COMPARISON:  None. FINDINGS: Calvarium and upper cervical spine: No definitive skeletal metastasis. Orbits: Bilateral cataract resection.  Negative for mass Sinuses and Mastoids: Clear. Brain: There is an enhancing intra-axial mass in the parasagittal right frontal parietal lobe along the upper motor strip measuring 19 mm. Moderate vasogenic edema without shift or herniation. No other masses identified. MR signal shows dense cellularity. No definitive hemorrhagic component. No acute infarct, hemorrhage, hydrocephalus, or major vessel occlusion. Metastasis is unexpected finding, but to accelerate care these results will be called to the ordering clinician or representative by the Radiologist Assistant, and communication  documented in the PACS or zVision Dashboard. IMPRESSION: 19 mm high right frontoparietal mass consistent with solitary metastasis. Moderate vasogenic edema without shift. Electronically Signed   By: Monte Fantasia M.D.   On: 05/08/2015 11:40    Medications: I have reviewed the patient's current medications.  Assessment/Plan: 1. Stage IIIB (T3 N2a) poorly different it adenocarcinoma of the right colon, status post a right colectomy and end ileostomy 07/09/2013.  4 of 15 lymph nodes positive for metastatic carcinoma and 2 tumor deposits.   Cycle 1 adjuvant Xeloda 08/15/2013.   Cycle 2 adjuvant Xeloda 09/05/2013.   Cycle 3 adjuvant Xeloda with a dose reduction 10/03/2013.   Cycle 4 adjuvant Xeloda 10/25/2013.   Cycle 5 adjuvant Xeloda 11/16/2013.  Cycle 6 adjuvant Xeloda 12/06/2013  Cycle 7 of adjuvant Xeloda 12/27/2013  Cycle 8 of adjuvant Xeloda 01/17/2014.  Restaging CT scans 02/14/2014 with stable 4 to 5 mm right lower lobe pulmonary nodule; interval enlargement of left periaortic lymphadenopathy.  Ileostomy takedown 03/23/2014  Restaging CT scan 09/04/2014 with stable small pulmonary nodules. Stable and smaller mesenteric and retroperitoneal lymph nodes. No evidence of metastatic disease. 2. History of multiple nonmelanoma skin cancers. 3. History of hypertension. 4. Indeterminate 4 mm right lung base nodule on the CT abdomen 07/06/2013.  Chest CT 08/05/2013 with middle and right lower lobe nodules measuring up to 4 mm. Repeat chest CT planned at a 6-9 month interval.  Restaging chest CT 02/14/2014 with stable 4-5 mm right lower lobe pulmonary nodule. 5. Rash secondary to Xeloda. Resolved 6. 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 Resolved. 7. Iron deficiency anemia-stool Hemoccult positive 12/20/2014, colonoscopy 01/04/2015  negative other than diverticulosis in the sigmoid colon 8. Nausea/back pain/anorexia. Symptoms persist. CT chest/abdomen/pelvis  03/30/2015 with 2 tiny stable right lung nodules; new mild diffuse intrahepatic biliary ductal dilatation; no liver mass; new prominent gallbladder distention; new prominently dilated common bile duct; thickening and heterogeneity of the pancreatic head with new peripancreatic fat stranding extending into the central mesentery and anterior paranephric retroperitoneal spaces bilaterally; new main pancreatic duct dilatation; new diffuse left adrenal thickening was suggestion of a new 1.2 x 1.1 cm left adrenal nodule; new upper retroperitoneal lymphadenopathy.  MRI abdomen 04/09/2015 confirmed circumferential narrowing of the distal common bile duct and narrowing of the distal pancreatic duct. Severe biliary ductal dilatation  EUS 05/04/2015 confirmed a dilated common bile duct, mass near the ampulla, FNA biopsy-pathology pending  9. Left hemiplegia-MRI brain 05/08/2015 confirmed an isolated right frontoparietal metastasis with edema, Decadron started 05/08/2015  10. Sacral decubitus ulcer   Ms. Duplantis appears unchanged. Hopefully the left hemiplegia and nausea will improve over the next few days with Decadron. Radiation oncology will plan palliative radiation.  She will need skilled nursing facility placement at discharge. I recommend hospice care. Her family is not here this morning. I will discuss a Hospice referral with Ms. Arletha Grippe tomorrow.   Recommendations: 1. Continue Decadron 2. Care management consult for placement 3. Follow-up on pathology report from the duodenal biopsy at [facility]   LOS: 2 days   Betsy Coder, MD   05/09/2015, 12:53 PM

## 2015-05-09 NOTE — Clinical Social Work Note (Signed)
Clinical Social Work Assessment  Patient Details  Name: [name] MRN: [id number] Date of Birth: Jan 20, 1925  Date of referral:  05/09/15               Reason for consult:  Discharge Planning                Permission sought to share information with:  Family Supports Permission granted to share information::  Yes, Verbal Permission Granted  Name::     Carmelia Roller  Agency::     Relationship::  daughter  Contact Information:  [phone]  Housing/Transportation Living arrangements for the past 2 months:  [place] of Information:  Patient, Adult Children Patient Interpreter Needed:  None Criminal Activity/Legal Involvement Pertinent to Current Situation/Hospitalization:  No - Comment as needed Significant Relationships:  Adult Children Lives with:  Self Do you feel safe going back to the place where you live?  No Need for family participation in patient care:  Yes (Comment) (pt request pt daughter, Colletta [place] be involved in disposition discussion)  Care giving concerns:  Pt admitted from home alone. PT recommending SNF.   Social Worker assessment / plan:    CSW received referral for New SNF.   CSW met with pt at bedside. CSW introduced self and explained role. Pt reports that she is having a tough day, but agreeable to [place] visit. CSW discussed recommendation for rehab at [facility] following hospitalization. Pt reports that her daughter is working on that. CSW obtained permission to contact pt daughter and discussed with pt that CSW can assist with placement in SNF. Pt discussed her understanding of plans for upcoming treatments. Pt discussed that pt daughter can better discuss SNF placement with this CSW. Pt agreeable to [facility] search.   CSW contacted pt daughter, Colletta [place] via telephone and left message.   CSW completed FL2 and initiated SNF search to [facility]. CSW to get further clarification from MD about radiation plans.   CSW to  continue to follow.   Employment status:  Retired Nurse, adult PT Recommendations:  [place] / Referral to community resources:  [place]  Patient/Family's Response to care:  Pt alert and oriented x 4. Pt deferring detailed discussion about SNF to pt daughter, Colletta Maryland. CSW left message with pt daughter.   Patient/Family's Understanding of and Emotional Response to Diagnosis, Current Treatment, and Prognosis:  Pt displayed understanding about upcoming plans for treatments and agreeable to SNF, but prefers CSW to have detailed discussion about SNF with pt daughter.   Emotional Assessment Appearance:  Appears younger than stated age Attitude/Demeanor/Rapport:  Other (cooperative) Affect (typically observed):  Appropriate Orientation:  Oriented to Self, Oriented to Place, Oriented to  Time, Oriented to Situation Alcohol / Substance use:  Not Applicable Psych involvement (Current and /or in the community):  No (Comment)  Discharge Needs  Concerns to be addressed:  Discharge Planning Concerns Readmission within the last 30 days:  No Current discharge risk:  Physical Impairment Barriers to Discharge:  Continued Medical Work up   Ladell Pier, LCSW 05/09/2015, 4:03 PM  [phone]

## 2015-05-09 NOTE — Progress Notes (Signed)
[name] 9:22 AM  Subjective: Patient seen and examined and case discussed with Dr. Benay Spice and patient familiar to me from recent office visit and her endoscopic ultrasound was reviewed by pathology is pending and she is currently doing fine tolerating liquids but has some trouble with solids and does have some constipation but not too much abdominal pain and she has not been vomiting and her brain findings were discussed and she does not like sweet things and things just don't taste right but she has no other GI complaints  Objective: Vital signs stable afebrile patient actually looks better today than she did in the office 2 weeks ago lungs are clear heart regular rate and rhythm abdomen still little tender occasional bowel sounds soft no guarding or rebound labs essentially unchanged  Assessment: Minimal CBD obstruction and duodenal partial obstruction awaiting biopsies from [place]: Would see how treatment of her brain lesion goes but if signs of either significant gastric outlet obstruction or CBD obstruction will probably need stenting in the future if patient and family want to take that step at the appropriate time and that was discussed with the patient and will follow at a distance and happy to see back as needed and if duodenal stent is needed placing a CBD stent first if able would be indicated  [facility] E  Pager [phone] After 5PM or if no answer call [phone]

## 2015-05-09 NOTE — Progress Notes (Signed)
CM notes pt agreeable to SNF; CSW aware and arranging.  No other CM needs at this time.

## 2015-05-09 NOTE — Progress Notes (Addendum)
CSW received referral for New SNF.   CSW attempted to meet with pt at bedside, but pt currently out of room.  CSW to follow up at a later time to complete full psychosocial assessment.  Alison Murray, MSW, [place] Work [phone]

## 2015-05-09 NOTE — NC FL2 (Signed)
[place] LEVEL OF CARE SCREENING TOOL     IDENTIFICATION  Patient Name: [name] Birthdate: 21-Mar-1924 Sex: female Admission Date (Current Location): 05/07/2015  [facility] and [place] Number:  Herbalist and Address:  [facility],  [facility] [address], [place]      Provider Number: [id number]  Attending Physician Name and Address:  Orson Eva, MD  Relative Name and Phone Number:       Current Level of Care: Hospital Recommended Level of Care: [place] Prior Approval Number:    Date Approved/Denied:   PASRR Number: [id number]  Discharge Plan: SNF    Current Diagnoses: Patient Active Problem List   Diagnosis Date Noted  . Pressure ulcer 05/08/2015  . Hemiplegia ([place])   . Metastasis to brain ([place])   . Left hemiparesis ([place]) 05/07/2015  . Essential hypertension 05/07/2015  . Dilated bile duct 05/07/2015  . Anorexia 03/13/2015  . Iron deficiency anemia 03/13/2015  . Ileostomy present ([place]) 03/23/2014  . Ileostomy in place [facility]) 07/14/2013  . Other and unspecified hyperlipidemia 07/13/2013  . Nausea without vomiting 07/10/2013  . Colonic obstruction ([place]) 07/09/2013  . 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 HTN (hypertension) 07/06/2013    Orientation RESPIRATION BLADDER Height & Weight     Self, Time, Situation, Place  Normal Continent Weight: 121 lb 11.1 oz (55.2 kg) Height:  5\' 4"  (162.6 cm)  BEHAVIORAL SYMPTOMS/MOOD NEUROLOGICAL BOWEL NUTRITION STATUS   (no behaviors)  (NONE) Continent Diet (Diet Regular)  AMBULATORY STATUS COMMUNICATION OF NEEDS Skin   Extensive Assist Verbally PU Stage and Appropriate Care (Stage II to Sacrum)   PU Stage 2 Dressing: Daily                   Personal Care Assistance Level of Assistance  Bathing, Feeding, Dressing Bathing Assistance: Limited assistance Feeding assistance: Independent Dressing  Assistance: Limited assistance     Functional Limitations Info  Sight, Hearing, Speech Sight Info: Adequate Hearing Info: Adequate Speech Info: Adequate    SPECIAL CARE FACTORS FREQUENCY  PT (By licensed PT), OT (By licensed OT)     PT Frequency: 5 x a week OT Frequency: 5 x a week            Contractures Contractures Info: Not present    Additional Factors Info  Code Status, Allergies Code Status Info: DNR code status Allergies Info: No Known Allergies           Current Medications (05/09/2015):  This is the current hospital active medication list Current Facility-Administered Medications  Medication Dose Route Frequency Provider Last Rate Last Dose  . acetaminophen (TYLENOL) tablet 650 mg  650 mg Oral Q6H PRN Bonnielee Haff, MD       Or  . acetaminophen (TYLENOL) suppository 650 mg  650 mg Rectal Q6H PRN Bonnielee Haff, MD      . amLODipine (NORVASC) tablet 5 mg  5 mg Oral Daily Eugenie Filler, MD   5 mg at 05/09/15 1024  . aspirin EC tablet 81 mg  81 mg Oral Daily Bonnielee Haff, MD   81 mg at 05/09/15 1024  . dexamethasone (DECADRON) injection 8 mg  8 mg Intravenous Q12H Ladell Pier, MD   8 mg at 05/09/15 1024  . enoxaparin (LOVENOX) injection 40 mg  40 mg Subcutaneous Q24H Bonnielee Haff, MD   40 mg at 05/08/15 1735  . hydrALAZINE (APRESOLINE) injection 10 mg  10 mg Intravenous Q6H PRN Bonnielee Haff, MD      . HYDROcodone-acetaminophen (NORCO/VICODIN) 5-325 MG per tablet 1 tablet  1 tablet Oral Q6H PRN Bonnielee Haff, MD   1 tablet at 05/09/15 1031  . [START ON 05/10/2015] LORazepam (ATIVAN) injection 0.5 mg  0.5 mg Intravenous Once Shanon Brow Tat, MD      . LORazepam (ATIVAN) tablet 0.25 mg  0.25 mg Oral Q6H PRN Bonnielee Haff, MD      . morphine 2 MG/ML injection 2-4 mg  2-4 mg Intravenous Q2H PRN Bonnielee Haff, MD   4 mg at 05/09/15 0600  . multivitamin with minerals tablet 1 tablet  1 tablet Oral Daily Bonnielee Haff, MD   1 tablet at 05/09/15 1024  .  ondansetron (ZOFRAN) 8 mg in sodium chloride 0.9 % 50 mL IVPB  8 mg Intravenous 4 times per day Eugenie Filler, MD   8 mg at 05/09/15 0600  . prochlorperazine (COMPAZINE) injection 10 mg  10 mg Intravenous Q6H PRN Eugenie Filler, MD      . traMADol Veatrice Bourbon) tablet 50 mg  50 mg Oral Q6H PRN Eugenie Filler, MD         Discharge Medications: Please see discharge summary for a list of discharge medications.  Relevant Imaging Results:  Relevant Lab Results:   Additional Information SSN: [id number]. Pt being simulated for radiation treatment today-but schedule for radiation is not yet available to provide, but will update facilities once available.  KIDD, SUZANNA A, LCSW

## 2015-05-09 NOTE — Clinical Social Work Placement (Signed)
   CLINICAL SOCIAL WORK PLACEMENT  NOTE  Date:  05/09/2015  Patient Details  Name: [name] MRN: [id number] Date of Birth: [birth year]  Clinical Social Work is seeking post-discharge placement for this patient at the [place] level of care (*CSW will initial, date and re-position this form in  chart as items are completed):  Yes   Patient/family provided with [place] Work Department's list of facilities offering this level of care within the geographic area requested by the patient (or if unable, by the patient's family).  Yes   Patient/family informed of their freedom to choose among providers that offer the needed level of care, that participate in Medicare, Medicaid or managed care program needed by the patient, have an available bed and are willing to accept the patient.  Yes   Patient/family informed of [facility]'s ownership interest in [facility] and [facility], as well as of the fact that they are under no obligation to receive care at these facilities.  PASRR submitted to EDS on       PASRR number received on       Existing PASRR number confirmed on 05/09/15     FL2 transmitted to all facilities in geographic area requested by pt/family on 05/09/15     FL2 transmitted to all facilities within larger geographic area on       Patient informed that his/her managed care company has contracts with or will negotiate with certain facilities, including the following:            Patient/family informed of bed offers received.  Patient chooses bed at       Physician recommends and patient chooses bed at      Patient to be transferred to   on  .  Patient to be transferred to facility by       Patient family notified on   of transfer.  Name of family member notified:        PHYSICIAN Please sign FL2, Please sign DNR     Additional Comment:    _______________________________________________ Ladell Pier,  LCSW 05/09/2015, 4:09 PM

## 2015-05-09 NOTE — [place] Notes (Addendum)
PROGRESS NOTE  CHARLINDA BLONDER [id number] DOB: [birth year] DOA: 05/07/2015 PCP: Melinda Crutch Brief History 82 year old female with a history of hypertension, iron deficiency anemia, stage IIIB poorly differentiated colon and no carcinoma status post right colectomy and an ileostomy 07/09/2013 presented with left hemiparesis for the past 2 weeks. The patient recently had CT of abdomen and pelvis 03/30/2015 with 2 tiny stable right lung nodules; new mild diffuse intrahepatic biliary ductal dilatation; no liver mass; new prominent gallbladder distention; new prominently dilated common bile duct; thickening and heterogeneity of the pancreatic head with new peripancreatic fat stranding extending into the central mesentery and anterior paranephric retroperitoneal spaces bilaterally; new main pancreatic duct dilatation.  MRI abdomen 04/09/2015 confirmed circumferential narrowing of the distal common bile duct and narrowing of the distal pancreatic duct. Severe biliary ductal dilatation. EUS 05/04/2015 [facility]) confirmed a dilated common bile duct, mass near the ampulla, FNA biopsy-pathology pending. Because of her left hemiparesis, MRI of the brain was obtained and revealed a new right frontal parietal mass with vasogenic edema without midline shift. Medical oncology, GI, and radiation oncology have been consulted.   Assessment/Plan: Left hemiparesis secondary to metastatic colon cancer -metastatic disease noted on MRI head which shows a 19 mm high right frontoparietal mass consistent with solitary metastases. Moderate vasogenic edema without shift.  -Continue IV Decadron.  -Radiation oncology have been consulted to evaluate for palliative XRT  -planning repeat MRI brain on 05/10/15 -Will need to follow-up on pathology report from duodenal biopsy at [facility].  -PT/OT.-->skilled nursing facility.  -Appreciate Drs. Sherrill and Magod  stage IIIB poorly differentiated adenocarcinoma of the  right colon Possible recurrent disease at the upper abdomen (carcinomatosis) and the right brain. See problem #1.  -On need to follow-up on duodenal biopsy pathology results from [facility]. Per oncology. -Minimal CBD obstruction and duodenal partial obstruction awaiting biopsies from [facility]  nausea Likely secondary to brain metastases and CBD obstructive process -Patient started on Decadron. -scheduled Zofran 8 mg IV every 6 hours 48 hours.  -Compazine when necessary.  Hypertension Stable. Resume home dose Norvasc.  -Hydralazine as needed.   Family Communication:   Pt at beside Disposition Plan:   SNF when cleared by medical and radiation oncology      Procedures/Studies: Mr [name] Wo Contrast  05-11-15  CLINICAL DATA:  Left hemi paresis. History of colon cancer. Metastasis versus infarct. EXAM: MRI HEAD WITHOUT AND WITH CONTRAST TECHNIQUE: Multiplanar, multiecho pulse sequences of the brain and surrounding structures were obtained without and with intravenous contrast. CONTRAST:  10 cc MultiHance intravenous COMPARISON:  None. FINDINGS: Calvarium and upper cervical spine: No definitive skeletal metastasis. Orbits: Bilateral cataract resection.  Negative for mass Sinuses and Mastoids: Clear. Brain: There is an enhancing intra-axial mass in the parasagittal right frontal parietal lobe along the upper motor strip measuring 19 mm. Moderate vasogenic edema without shift or herniation. No other masses identified. MR signal shows dense cellularity. No definitive hemorrhagic component. No acute infarct, hemorrhage, hydrocephalus, or major vessel occlusion. Metastasis is unexpected finding, but to accelerate care these results will be called to the ordering clinician or representative by the Radiologist Assistant, and communication documented in the PACS or zVision Dashboard. IMPRESSION: 19 mm high right frontoparietal mass consistent with solitary metastasis. Moderate vasogenic edema  without shift. Electronically Signed   By: Monte Fantasia M.D.   On: 05-11-15 11:40   Mr Abdomen W Wo Contrast  04/10/2015  CLINICAL DATA:  Nausea and LEFT flank pain. Biliary duct dilatation in on recent CT exam. History of colorectal carcinoma. EXAM: MRI ABDOMEN WITHOUT AND WITH CONTRAST TECHNIQUE: Multiplanar multisequence MR imaging of the abdomen was performed both before and after the administration of intravenous contrast. CONTRAST:  31mL MULTIHANCE GADOBENATE DIMEGLUMINE 529 MG/ML IV SOLN COMPARISON:  CT 03/30/2015, 09/04/2014 FINDINGS: Lower chest:  Lung bases are clear. Hepatobiliary: There is intrahepatic and extrahepatic biliary duct dilatation. There is fusiform dilatation of the common hepatic duct and common bile duct down to the distal common bile duct. The common hepatic duct measures 17 mm and the common bile duct measures 15 mm. There is tight stricturing of the most distal common bile duct over a 9 mm segment. Caliber changes from a 15 mm common bile duct to a 2 mm bile duct through this region of distal stricturing. There are no filling defects within the common bile duct. In axial projection the 9 mm of distal common bile duct stricturing is circumferential. The gallbladder is mildly distended at 4.7 cm. No gallstones evident within the gallbladder. There is no discrete hepatic lesion. Pancreas: The pancreatic duct is dilated to 6 mm (image 39, series 7). The duct dilatation extends from the ampullary region extends through the body and tail. No evidence pancreatic atrophy. There is no discrete mass within the pancreatic head. There is a periampullary duodenum diverticulum in the vicinity of the distal obstruction measuring 15 mm (image 25, series 13). The narrowing of the distal common bile duct is circumferential as seen on image 50, series 17. The circumferential narrowing is also seen on image 30, series 23 coronal imaging Spleen: Normal spleen. Adrenals/urinary tract: Adrenal glands  and kidneys are normal. Stomach/Bowel: Stomach and limited of the small bowel is unremarkable Vascular/Lymphatic: Abdominal aortic normal caliber. No retroperitoneal periportal lymphadenopathy. Musculoskeletal: No aggressive osseous lesion IMPRESSION: 1. Circumferential narrowing of the most distal 9 mm of the common bile duct with severe biliary ductal dilatation proximal to this distal narrowing. Differential includes inflammatory stricturing, primary pancreatic neoplasm, or primary biliary neoplasm. As no biliary duct dilatation was evident on CT of 09/04/2014, findings are most concerning for neoplasm. 2. Mlid obstruction of the distal pancreatic duct with differential as above. 3. Small periampullary duodenal diverticulum. Electronically Signed   By: Suzy Bouchard M.D.   On: 04/10/2015 08:27         Subjective: Patient states that she is not eating much. She has intermittent nausea without emesis. Denies any chest pain, shortness breath, coughing, hemoptysis, abdominal pain, dysuria, hematuria. No headache or visual disturbance.  Objective: Filed Vitals:   05/08/15 0445 05/08/15 1350 05/08/15 2140 05/09/15 0501  BP: 158/78 143/78 151/72 139/77  Pulse: 85 89 100 91  Temp: 98.5 F (36.9 C) 98.6 F (37 C) 98.3 F (36.8 C) 98.1 F (36.7 C)  TempSrc: Oral Oral Oral Oral  Resp: 16 18 16 16   Height:      Weight:      SpO2: 98% 99% 100% 100%    Intake/Output Summary (Last 24 hours) at 05/09/15 1355 Last data filed at 05/09/15 0846  Gross per 24 hour  Intake   1502 ml  Output    950 ml  Net    552 ml   Weight change:  Exam:   General:  Pt is alert, follows commands appropriately, not in acute distress  HEENT: No icterus, No thrush, No neck mass, [place]/AT  Cardiovascular: RRR, S1/S2, no rubs, no gallops  Respiratory: CTA bilaterally, no  wheezing, no crackles, no rhonchi  Abdomen: Soft/+BS, non tender, non distended, no guarding  Extremities: No edema, No lymphangitis, No  petechiae, No rashes, no synovitis  Data Reviewed: Basic Metabolic Panel:  Recent Labs Lab 05/07/15 1129 05/08/15 0329 05/09/15 0327  NA 129* 134* 134*  K 3.4* 4.0 4.2  CL  --  103 101  CO2 23 20* 20*  GLUCOSE 86 89 139*  BUN 9.1 8 9   CREATININE 0.8 0.57 0.64  CALCIUM 10.0 9.1 9.7   Liver Function Tests:  Recent Labs Lab 05/07/15 1129 05/08/15 0329  AST 40* 70*  ALT 41 62*  ALKPHOS 177* 189*  BILITOT 0.46 1.0  PROT 7.6 6.5  ALBUMIN 3.5 3.2*   No results for input(s): LIPASE, AMYLASE in the last 168 hours. No results for input(s): AMMONIA in the last 168 hours. CBC:  Recent Labs Lab 05/07/15 1129 05/08/15 0329 05/09/15 0327  WBC 6.9 5.3 4.1  NEUTROABS 5.0  --   --   HGB 12.5 11.4* 12.0  HCT 37.8 35.0* 36.2  MCV 84.4 85.2 85.2  PLT 452* 396 418*   Cardiac Enzymes: No results for input(s): CKTOTAL, CKMB, CKMBINDEX, TROPONINI in the last 168 hours. BNP: Invalid input(s): POCBNP CBG: No results for input(s): GLUCAP in the last 168 hours.  No results found for this or any previous visit (from the past 240 hour(s)).   Scheduled Meds: . amLODipine  5 mg Oral Daily  . aspirin EC  81 mg Oral Daily  . dexamethasone  8 mg Intravenous Q12H  . enoxaparin (LOVENOX) injection  40 mg Subcutaneous Q24H  . [START ON 05/10/2015] LORazepam  0.5 mg Intravenous Once  . multivitamin with minerals  1 tablet Oral Daily  . ondansetron (ZOFRAN) IV  8 mg Intravenous 4 times per day   Continuous Infusions:    Tearah Saulsbury, DO  [facility] Pager [phone]  If 7PM-7AM, please contact night-coverage [email or web address] Password [email or web address] 05/09/2015, 1:55 PM   LOS: 2 days

## 2015-05-09 NOTE — Evaluation (Signed)
Physical Therapy Evaluation Patient Details Name: [name] MRN: [id number] DOB: 09/19/1924 Today's Date: 05/09/2015   History of Present Illness  82 yo female with h/o colon CA status post surgery and chemotherapy in 2015. Pt presents with 2-3 weeks of nausea, left sided weakness, and back pain. MRI revealed: 19 mm high right frontoparietal mass consistent with solitary metastasis.  Clinical Impression  Pt admitted with above diagnosis. Pt currently with functional limitations due to the deficits listed below (see PT Problem List).  Pt will benefit from skilled PT to increase their independence and safety with mobility to allow discharge to the venue listed below.  Pt requires +2 for confidence with SPT, although 2nd person was there for safety.  Did recommend +2 to nursing.  Pt self limiting at times and stating "I can't do that" to movements she demonstrated earlier in the session. Not always receptive to education provided by therapist.  Recommend SNF and pt in agreement.    Follow Up Recommendations SNF    Equipment Recommendations  None recommended by PT    Recommendations for Other Services       Precautions / Restrictions Precautions Precautions: Fall      Mobility  Bed Mobility Overal bed mobility: Needs Assistance Bed Mobility: Supine to Sit;Sit to Supine     Supine to sit: Mod assist Sit to supine: Min assist   General bed mobility comments: Pt instructed in how to use R LE to help L LE.  Cueing for technique.  Pt reaching over to R shoulder with L hand, but when asked to reach rail with L UE, pt stating she couldn't do it, even though R shoulder was touching the rail.   Transfers Overall transfer level: Needs assistance Equipment used: Rolling walker (2 wheeled);1 person hand held assist Transfers: Sit to/from [facility] Sit to Stand: Mod assist;+2 safety/equipment Stand pivot transfers: Mod assist;+2 physical assistance       General  transfer comment: Used RW for SPT (to the right) bed > BSC with pt very apprehensive about it.  In standing, no progression of L LE, but no buckeling either. Pt refusing to sit in recliner, so transferred to the L with HHA of 1 with aide behind and cleaned pt while she was standing.   Ambulation/Gait                Stairs            Wheelchair Mobility    Modified Rankin (Stroke Patients Only)       Balance Overall balance assessment: Needs assistance Sitting-balance support: Feet supported Sitting balance-Leahy Scale: Fair       Standing balance-Leahy Scale: Poor                               Pertinent Vitals/Pain Pain Assessment: No/denies pain    Home Living Family/patient expects to be discharged to:: Private residence Living Arrangements: Alone Available Help at Discharge:  (none; son and daughter have full time jobs) Type of Home: House Home Access: Stairs to enter Entrance Stairs-Rails: Left (side) Entrance Stairs-Number of Steps: 1 - side, 4 - front Home Layout: One level Home Equipment: Hand held shower head;Walker - 2 wheels;Bedside commode      Prior Function Level of Independence: Independent         Comments: pt independent prior to weakness that started ~2-3 weeks ago      Hand Dominance  Dominant Hand: Right    Extremity/Trunk Assessment   Upper Extremity Assessment: Defer to OT evaluation           Lower Extremity Assessment: LLE deficits/detail   LLE Deficits / Details: grossly 2- to 2/5 Difficult to assess as pt states she can't move L LE at all, but then will move it.  Cervical / Trunk Assessment: Kyphotic  Communication   Communication: No difficulties  Cognition Arousal/Alertness: Awake/alert Behavior During Therapy: Anxious Overall Cognitive Status: Within Functional Limits for tasks assessed                      General Comments General comments (skin integrity, edema, etc.): Pt  instructed in continuing to try to use L UE/L LE.  Pt has tendency to self limit in movements that she has demonstrated ability.  While on [facility] she was able to lift hand to lap and wash hands, but when asked to bring hand up (from it hanging down on her side) she says she is unable.    Exercises        Assessment/Plan    PT Assessment Patient needs continued PT services  PT Diagnosis Hemiplegia non-dominant side;Generalized weakness   PT Problem List Decreased strength;Decreased activity tolerance;Decreased balance;Decreased mobility;Decreased coordination;Decreased knowledge of use of DME;Decreased safety awareness  PT Treatment Interventions DME instruction;Gait training;Functional mobility training;Therapeutic activities;Therapeutic exercise;Neuromuscular re-education;Balance training   PT Goals (Current goals can be found in the Care Plan section) Acute Rehab PT Goals Patient Stated Goal: go to rehab PT Goal Formulation: With patient Time For Goal Achievement: 05/23/15 Potential to Achieve Goals: Fair    Frequency Min 3X/week   Barriers to discharge Decreased caregiver support      Co-evaluation               End of Session Equipment Utilized During Treatment: Gait belt Activity Tolerance: Patient tolerated treatment well Patient left: in bed;with nursing/sitter in room Nurse Communication: Mobility status         Time: [id number] PT Time Calculation (min) (ACUTE ONLY): 20 min   Charges:   PT Evaluation $PT Eval Moderate Complexity: 1 Procedure     PT G Codes:        [name] 05/09/2015, 10:35 AM

## 2015-05-10 ENCOUNTER — Ambulatory Visit (HOSPITAL_COMMUNITY)
Admit: 2015-05-10 | Discharge: 2015-05-10 | Disposition: A | Discharge status: Home / Self Care | Payer: Medicare Other | Attending: Radiation Oncology | Admitting: Radiation Oncology

## 2015-05-10 ENCOUNTER — Telehealth: Payer: Self-pay | Admitting: *Deleted

## 2015-05-10 DIAGNOSIS — G8190 Hemiplegia, unspecified affecting unspecified side: Secondary | ICD-10-CM

## 2015-05-10 LAB — BASIC METABOLIC PANEL
ANION GAP: 9 (ref 5–15)
BUN: 15 mg/dL (ref 6–20)
CALCIUM: 9.1 mg/dL (ref 8.9–10.3)
CO2: 22 mmol/L (ref 22–32)
Chloride: 99 mmol/L — ABNORMAL LOW (ref 101–111)
Creatinine, Ser: 0.59 mg/dL (ref 0.44–1.00)
GFR calc Af Amer: >60 mL/min (ref >60)
GLUCOSE: 155 mg/dL — AB (ref 65–99)
Potassium: 4.1 mmol/L (ref 3.5–5.1)
SODIUM: 130 mmol/L — AB (ref 135–145)

## 2015-05-10 MED ORDER — SODIUM CHLORIDE 0.9 % IV SOLN
INTRAVENOUS | Status: AC
  Administered 2015-05-10: 21:00:00 via INTRAVENOUS
  Filled 2015-05-10: qty 1000

## 2015-05-10 MED ORDER — GADOBENATE DIMEGLUMINE 529 MG/ML IV SOLN
10.0000 mL | Freq: Once | INTRAVENOUS | Status: AC
  Administered 2015-05-10: 10 mL via INTRAVENOUS

## 2015-05-10 NOTE — [place] Notes (Signed)
CSW continuing to follow.   CSW received return phone call from pt daughter, [name] this morning. CSW discussed with pt daughter recommendations from attending MD and oncologist. Pt oncologist recommending hospice at SNF. CSW discussed with pt daughter that for pt to have full hospice services at SNF then options would be private pay at [facility] or Medicaid at [facility]. Pt daughter reports that pt family applied for Medicaid last week and Medicaid office will need facility to send FL2 to get the Medicaid in place at the facility. CSW discussed that pt can go to SNF under her [facility] Medicare, but would have to participate in therapy and have palliative care services follow until the Medicaid was approved for SNF and then full hospice services could begin or pt family could pay privately for room and board with full hospice services until Medicaid came through to have full hospice services. Pt daughter discussed that she wished to speak with her brother regarding which direction to go at SNF until the Medicaid came through for coverage at [facility] for Hospice services to be initiated.   CSW received call back from pt daughter at 2:30 pm stating that pt daughter had spoken with her brother and they want to pursue SNF using pt [facility] Medicare with palliative care services until [facility] approved for pt to transition to Medicaid at [facility] and full hospice services to become involved at SNF at that time. CSW reviewed SNF bed offers with pt daughter. Pt daughter stated that pt was hopeful for [facility] or [facility] and CSW discussed that both facilities did not offer a bed. CSW reviewed other bed offers with pt daughter and pt daughter chooses [facility].   CSW met with pt at bedside to discuss. Pt expressed disappointment that [facility] or [facility] [place] are not options. Pt discussed that her husband was at [facility] before. CSW discussed with pt the other options to for SNF and pt agreeable to [place]  contacted [facility] and confirmed bed availability for tomorrow.   CSW updated MD.   CSW to continue to follow to provide support and assist with pt discharge to [facility] when medically ready, anticipate d/c tomorrow.  [name], MSW, [place] [facility] [phone]

## 2015-05-10 NOTE — Progress Notes (Signed)
OT Cancellation Note  Patient Details Name: [name] MRN: [id number] DOB: October 08, 1924   Cancelled Treatment:    Reason Eval/Treat Not Completed: Patient at procedure or test/ unavailable -- not in room. Also note via chart review that pt may be transitioning to comfort care. Will continue to follow.  Erek Kowal A 05/10/2015, 11:02 AM

## 2015-05-10 NOTE — Telephone Encounter (Signed)
Call from pt's daughter to discuss plan for discharge. Pt will go to [facility] with hospice. Colletta Maryland has spoken with Education officer, museum, she plans to be with pt tomorrow. Informed her plans for radiation treatment and any follow up will likely be reviewed tomorrow.

## 2015-05-10 NOTE — Progress Notes (Signed)
Patient's case and pathology discussed with Dr. Jerene Pitch yesterday and today with Dr. Benay Spice and see yesterday's note for details and I will be on standby to help when necessary particularly if stenting needed in the future

## 2015-05-10 NOTE — Progress Notes (Signed)
PROGRESS NOTE  [name] [id number] DOB: 1924-06-23 DOA: 05/07/2015 PCP: Melinda Crutch  Brief History 82 year old female with a history of hypertension, iron deficiency anemia, stage IIIB poorly differentiated colon and no carcinoma status post right colectomy and an ileostomy 07/09/2013 presented with left hemiparesis for the past 2 weeks. The patient recently had CT of abdomen and pelvis 03/30/2015 with 2 tiny stable right lung nodules; new mild diffuse intrahepatic biliary ductal dilatation; no liver mass; new prominent gallbladder distention; new prominently dilated common bile duct; thickening and heterogeneity of the pancreatic head with new peripancreatic fat stranding extending into the central mesentery and anterior paranephric retroperitoneal spaces bilaterally; new main pancreatic duct dilatation. MRI abdomen 04/09/2015 confirmed circumferential narrowing of the distal common bile duct and narrowing of the distal pancreatic duct. Severe biliary ductal dilatation. EUS 05/04/2015 [facility]) confirmed a dilated common bile duct, mass near the ampulla, FNA biopsy-pathology pending. Because of her left hemiparesis, MRI of the brain was obtained and revealed a new right frontal parietal mass with vasogenic edema without midline shift. Medical oncology, GI, and radiation oncology have been consulted.   Assessment/Plan: Left hemiparesis secondary to metastatic colon cancer -metastatic disease noted on MRI head which shows a 19 mm high right frontoparietal mass consistent with solitary metastases. Moderate vasogenic edema without shift.  -Continue IV Decadron--switch to po at time of d/c -Radiation oncology have been consulted to evaluate for palliative XRT  -pt will have one SRS fraction on 05/11/15 - repeat MRI brain on 05-11-15 per radiation Onc -Will need to follow-up on pathology report from duodenal biopsy at [facility] adenocarcinoma  -PT/OT.-->skilled nursing  facility.  -Appreciate Drs. Sherrill and Magod -05-11-2015 case discussed with Dr. 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 See problem #1.  -On need to follow-up on duodenal biopsy pathology results from [facility]. Per oncology. -Minimal CBD obstruction and duodenal partial obstruction awaiting biopsies from [facility]-->showed adenocarcinoma from EUS  nausea Likely secondary to brain metastases and CBD obstructive process -Patient started on Decadron. -scheduled Zofran 8 mg IV every 6 hours 48 hours.  -Compazine when necessary.  Hypertension Stable. Resumed home dose Norvasc.  -Hydralazine as needed.  hyponatremia - likely due to volume depletion from poor oral intake -IV fluids -am BMP   Family Communication: No family at beside Disposition Plan: SNF 05/11/15    Procedures/Studies: Mr Kizzie Fantasia Contrast  2015/05/11  CLINICAL DATA:  Colon cancer. LEFT hemiparesis, leg greater than arm. Confusion. SRS planning EXAM: MRI HEAD WITHOUT AND WITH CONTRAST TECHNIQUE: Multiplanar, multiecho pulse sequences of the brain and surrounding structures were obtained without and with intravenous contrast. CONTRAST:  3mL MULTIHANCE GADOBENATE DIMEGLUMINE 529 MG/ML IV SOLN COMPARISON:  1.5 tesla MR 05/08/2015. FINDINGS: Redemonstrated is 14 x 18 x 18 mm (R-L x A-P x C-C) superficial, but intra-axial mass, RIGHT posterior frontal cortex, parasagittal location with surrounding vasogenic edema. There is avid postcontrast enhancement. Marked surrounding vasogenic edema with no midline shift but slight depression of the RIGHT lateral ventricle. No additional brain lesions. No osseous lesions. Extracranial soft tissues unremarkable. No significant change from previous exam. IMPRESSION: Superficial, but intra-axial, RIGHT posterior frontal  parasagittal metastasis. This appears to be a solitary lesion, approximately 2 cm. Extensive vasogenic edema but no midline shift. Electronically Signed   By: Staci Righter  M.D.   On: 05/10/2015 15:32   Mr [name] Wo Contrast  05/08/2015  CLINICAL DATA:  Left hemi paresis. History of colon cancer. Metastasis versus infarct. EXAM: MRI HEAD WITHOUT AND WITH CONTRAST TECHNIQUE: Multiplanar, multiecho pulse sequences of the brain and surrounding structures were obtained without and with intravenous contrast. CONTRAST:  10 cc MultiHance intravenous COMPARISON:  None. FINDINGS: Calvarium and upper cervical spine: No definitive skeletal metastasis. Orbits: Bilateral cataract resection.  Negative for mass Sinuses and Mastoids: Clear. Brain: There is an enhancing intra-axial mass in the parasagittal right frontal parietal lobe along the upper motor strip measuring 19 mm. Moderate vasogenic edema without shift or herniation. No other masses identified. MR signal shows dense cellularity. No definitive hemorrhagic component. No acute infarct, hemorrhage, hydrocephalus, or major vessel occlusion. Metastasis is unexpected finding, but to accelerate care these results will be called to the ordering clinician or representative by the Radiologist Assistant, and communication documented in the PACS or zVision Dashboard. IMPRESSION: 19 mm high right frontoparietal mass consistent with solitary metastasis. Moderate vasogenic edema without shift. Electronically Signed   By: Monte Fantasia M.D.   On: 05/08/2015 11:40         Subjective:   The patient feels that her left hemiparesis is slighly betterin the past few days. Denies any fevers, chils,chest pain, shortness breath, nausea, vomiting, diarrhea, abdominal pain. She is still not eating much.  Objective: Filed Vitals:   05/09/15 0501 05/09/15 2020 05/10/15 0440 05/10/15 1430  BP: 139/77 125/62 150/71 157/69  Pulse: 91 95 88 87  Temp: 98.1 F (36.7 C) 98 F (36.7  C) 97.6 F (36.4 C) 98.6 F (37 C)  TempSrc: Oral Oral Oral Oral  Resp: 16 16 14 16   Height:      Weight:      SpO2: 100% 95% 97% 97%    Intake/Output Summary (Last 24 hours) at 05/10/15 1903 Last data filed at 05/10/15 1000  Gross per 24 hour  Intake    950 ml  Output    750 ml  Net    200 ml   Weight change:  Exam:   General:  Pt is alert, follows commands appropriately, not in acute distress  HEENT: No icterus, No thrush, No neck mass, [place]/AT  Cardiovascular: RRR, S1/S2, no rubs, no gallops  Respiratory: diminished breath sounds but clear to auscultation.  Abdomen: Soft/+BS, non tender, non distended, no guarding  Extremities: No edema, No lymphangitis, No petechiae, No rashes, no synovitis  Data Reviewed: Basic Metabolic Panel:  Recent Labs Lab 05/07/15 1129 05/08/15 0329 05/09/15 0327 05/10/15 0351  NA 129* 134* 134* 130*  K 3.4* 4.0 4.2 4.1  CL  --  103 101 99*  CO2 23 20* 20* 22  GLUCOSE 86 89 139* 155*  BUN 9.1 8 9 15   CREATININE 0.8 0.57 0.64 0.59  CALCIUM 10.0 9.1 9.7 9.1   Liver Function Tests:  Recent Labs Lab 05/07/15 1129 05/08/15 0329  AST 40* 70*  ALT 41 62*  ALKPHOS 177* 189*  BILITOT 0.46 1.0  PROT 7.6 6.5  ALBUMIN 3.5 3.2*   No results for input(s): LIPASE, AMYLASE in the last 168 hours. No results for input(s): AMMONIA in the last 168 hours. CBC:  Recent Labs Lab 05/07/15 1129 05/08/15 0329 05/09/15 0327  WBC 6.9 5.3 4.1  NEUTROABS 5.0  --   --   HGB 12.5 11.4* 12.0  HCT 37.8 35.0* 36.2  MCV 84.4 85.2 85.2  PLT 452* 396 418*  Cardiac Enzymes: No results for input(s): CKTOTAL, CKMB, CKMBINDEX, TROPONINI in the last 168 hours. BNP: Invalid input(s): POCBNP CBG: No results for input(s): GLUCAP in the last 168 hours.  No results found for this or any previous visit (from the past 240 hour(s)).   Scheduled Meds: . amLODipine  5 mg Oral Daily  . aspirin EC  81 mg Oral Daily  . dexamethasone  8 mg Intravenous  Q12H  . enoxaparin (LOVENOX) injection  40 mg Subcutaneous Q24H   Continuous Infusions: . sodium chloride 0.9 % 1,000 mL infusion       Crystal Haslem, DO  [facility] Pager [phone]  If 7PM-7AM, please contact night-coverage [email or web address] Password [email or web address] 05/10/2015, 7:03 PM   LOS: 3 days

## 2015-05-10 NOTE — Care Management Important Message (Signed)
Important Message  Patient Details  Name: [name] MRN: [id number] Date of Birth: 06-Feb-1925   Medicare Important Message Given:  Yes    Camillo Flaming 05/10/2015, 1:03 [place] Message  Patient Details  Name: [name] MRN: [id number] Date of Birth: 02/16/1925   Medicare Important Message Given:  Yes    Camillo Flaming 05/10/2015, 1:01 PM

## 2015-05-10 NOTE — Progress Notes (Signed)
IP PROGRESS NOTE  Subjective:   She continues to have nausea. She is undergoing planning for SRS to the brain lesion.  Objective: Vital signs in last 24 hours: Blood pressure 150/71, pulse 88, temperature 97.6 F (36.4 C), temperature source Oral, resp. rate 14, height 5\' 4"  (1.626 m), weight 121 lb 11.1 oz (55.2 kg), SpO2 97 %.  Intake/Output from previous day: 03/01 0701 - 03/02 0700 In: 1190 [P.O.:1190] Out: 750 [Urine:750]  Physical Exam: HEENT: No thrush Abdomen: Soft, mild tenderness in the mid upper abdomen  Neurologic: Alert and oriented, left arm/hand and leg weakness-partially improved    Lab Results:  Recent Labs  05/08/15 0329 05/09/15 0327  WBC 5.3 4.1  HGB 11.4* 12.0  HCT 35.0* 36.2  PLT 396 418*    BMET  Recent Labs  05/09/15 0327 05/10/15 0351  NA 134* 130*  K 4.2 4.1  CL 101 99*  CO2 20* 22  GLUCOSE 139* 155*  BUN 9 15  CREATININE 0.64 0.59  CALCIUM 9.7 9.1    Studies/Results: Mr Kizzie Fantasia Contrast  05/08/2015  CLINICAL DATA:  Left hemi paresis. History of colon cancer. Metastasis versus infarct. EXAM: MRI HEAD WITHOUT AND WITH CONTRAST TECHNIQUE: Multiplanar, multiecho pulse sequences of the brain and surrounding structures were obtained without and with intravenous contrast. CONTRAST:  10 cc MultiHance intravenous COMPARISON:  None. FINDINGS: Calvarium and upper cervical spine: No definitive skeletal metastasis. Orbits: Bilateral cataract resection.  Negative for mass Sinuses and Mastoids: Clear. Brain: There is an enhancing intra-axial mass in the parasagittal right frontal parietal lobe along the upper motor strip measuring 19 mm. Moderate vasogenic edema without shift or herniation. No other masses identified. MR signal shows dense cellularity. No definitive hemorrhagic component. No acute infarct, hemorrhage, hydrocephalus, or major vessel occlusion. Metastasis is unexpected finding, but to accelerate care these results will be called to  the ordering clinician or representative by the Radiologist Assistant, and communication documented in the PACS or zVision Dashboard. IMPRESSION: 19 mm high right frontoparietal mass consistent with solitary metastasis. Moderate vasogenic edema without shift. Electronically Signed   By: Monte Fantasia M.D.   On: 05/08/2015 11:40    Medications: I have reviewed the patient's current medications.  Assessment/Plan: 1. Stage IIIB (T3 N2a) poorly different it adenocarcinoma of the right colon, status post a right colectomy and end ileostomy 07/09/2013.  4 of 15 lymph nodes positive for metastatic carcinoma and 2 tumor deposits.   Cycle 1 adjuvant Xeloda 08/15/2013.   Cycle 2 adjuvant Xeloda 09/05/2013.   Cycle 3 adjuvant Xeloda with a dose reduction 10/03/2013.   Cycle 4 adjuvant Xeloda 10/25/2013.   Cycle 5 adjuvant Xeloda 11/16/2013.  Cycle 6 adjuvant Xeloda 12/06/2013  Cycle 7 of adjuvant Xeloda 12/27/2013  Cycle 8 of adjuvant Xeloda 01/17/2014.  Restaging CT scans 02/14/2014 with stable 4 to 5 mm right lower lobe pulmonary nodule; interval enlargement of left periaortic lymphadenopathy.  Ileostomy takedown 03/23/2014  Restaging CT scan 09/04/2014 with stable small pulmonary nodules. Stable and smaller mesenteric and retroperitoneal lymph nodes. No evidence of metastatic disease. 2. History of multiple nonmelanoma skin cancers. 3. History of hypertension. 4. Indeterminate 4 mm right lung base nodule on the CT abdomen 07/06/2013.  Chest CT 08/05/2013 with middle and right lower lobe nodules measuring up to 4 mm. Repeat chest CT planned at a 6-9 month interval.  Restaging chest CT 02/14/2014 with stable 4-5 mm right lower lobe pulmonary nodule. 5. Rash secondary to Xeloda. Resolved 6. History of  hand/foot syndrome secondary to Xeloda. Resolved. 7. Iron deficiency anemia-stool Hemoccult positive 12/20/2014, colonoscopy 01/04/2015 negative other than diverticulosis in the  sigmoid colon 8. Nausea/back pain/anorexia. Symptoms persist. CT chest/abdomen/pelvis 03/30/2015 with 2 tiny stable right lung nodules; new mild diffuse intrahepatic biliary ductal dilatation; no liver mass; new prominent gallbladder distention; new prominently dilated common bile duct; thickening and heterogeneity of the pancreatic head with new peripancreatic fat stranding extending into the central mesentery and anterior paranephric retroperitoneal spaces bilaterally; new main pancreatic duct dilatation; new diffuse left adrenal thickening was suggestion of a new 1.2 x 1.1 cm left adrenal nodule; new upper retroperitoneal lymphadenopathy.  MRI abdomen 04/09/2015 confirmed circumferential narrowing of the distal common bile duct and narrowing of the distal pancreatic duct. Severe biliary ductal dilatation  EUS 05/04/2015 confirmed a dilated common bile duct, mass near the ampulla, FNA biopsy of soft tissue lesion surrounding the bile duct-adenocarcinoma  9. Left hemiplegia-MRI brain 05/08/2015 confirmed an isolated right frontoparietal metastasis with edema, Decadron started 05/08/2015  10. Sacral decubitus ulcer   Ms. Boyatt continues to have nausea. The left hemiplegia appears partially improved. She is being scheduled for SRS to the right brain lesion. I discussed the pathology findings with her. I suspect she has metastatic colon cancer with abdominal carcinomatosis and a brain metastasis. She agrees with a comfort care. She will need skilled nursing placement at discharge. I will make a referral to the [facility] program.  Recommendations: 1. Continue Decadron, taper. Dr. Lisbeth Renshaw 2. [facility] referral 3. Skilled nursing facility placement with Hospice   LOS: 3 days   Betsy Coder, MD   05/10/2015, 8:17 AM

## 2015-05-10 NOTE — Clinical Social Work Placement (Signed)
   CLINICAL SOCIAL WORK PLACEMENT  NOTE  Date:  05/10/2015  Patient Details  Name: GEORGEANN COHILL MRN: [id number] Date of Birth: 1924/05/06  Clinical Social Work is seeking post-discharge placement for this patient at the [place] level of care (*CSW will initial, date and re-position this form in  chart as items are completed):  Yes   Patient/family provided with [place] Work Department's list of facilities offering this level of care within the geographic area requested by the patient (or if unable, by the patient's family).  Yes   Patient/family informed of their freedom to choose among providers that offer the needed level of care, that participate in Medicare, Medicaid or managed care program needed by the patient, have an available bed and are willing to accept the patient.  Yes   Patient/family informed of [facility]'s ownership interest in [facility] and [facility], as well as of the fact that they are under no obligation to receive care at these facilities.  PASRR submitted to EDS on       PASRR number received on       Existing PASRR number confirmed on 05/09/15     FL2 transmitted to all facilities in geographic area requested by pt/family on 05/09/15     FL2 transmitted to all facilities within larger geographic area on       Patient informed that his/her managed care company has contracts with or will negotiate with certain facilities, including the following:        Yes   Patient/family informed of bed offers received.  Patient chooses bed at [facility]     Physician recommends and patient chooses bed at      Patient to be transferred to [facility] on  .  Patient to be transferred to facility by       Patient family notified on   of transfer.  Name of family member notified:        PHYSICIAN Please sign FL2, Please sign DNR     Additional Comment:     _______________________________________________ Ladell Pier, LCSW 05/10/2015, 3:51 PM

## 2015-05-10 NOTE — Progress Notes (Signed)
Nutrition Brief Note  Chart reviewed. Pt now transitioning to comfort care.  No further nutrition interventions warranted at this time.  Please consult as needed.   Ailani Governale, MS, RD, LDN Pager: [phone] After Hours Pager: [phone]    

## 2015-05-11 ENCOUNTER — Ambulatory Visit
Admit: 2015-05-11 | Discharge: 2015-05-11 | Disposition: A | Discharge status: Home / Self Care | Payer: Medicare Other | Attending: Radiation Oncology | Admitting: Radiation Oncology

## 2015-05-11 ENCOUNTER — Telehealth: Payer: Self-pay | Admitting: Oncology

## 2015-05-11 ENCOUNTER — Encounter: Payer: Self-pay | Admitting: Radiation Oncology

## 2015-05-11 ENCOUNTER — Other Ambulatory Visit: Payer: Self-pay | Admitting: *Deleted

## 2015-05-11 ENCOUNTER — Ambulatory Visit: Payer: Medicare Other | Attending: Radiation Oncology | Admitting: Radiation Oncology

## 2015-05-11 DIAGNOSIS — C7931 Secondary malignant neoplasm of brain: Secondary | ICD-10-CM

## 2015-05-11 DIAGNOSIS — I10 Essential (primary) hypertension: Secondary | ICD-10-CM

## 2015-05-11 DIAGNOSIS — Z85820 Personal history of malignant melanoma of skin: Secondary | ICD-10-CM

## 2015-05-11 DIAGNOSIS — C189 Malignant neoplasm of colon, unspecified: Secondary | ICD-10-CM

## 2015-05-11 LAB — BASIC METABOLIC PANEL
ANION GAP: 10 (ref 5–15)
BUN: 14 mg/dL (ref 6–20)
CO2: 25 mmol/L (ref 22–32)
Calcium: 9.3 mg/dL (ref 8.9–10.3)
Chloride: 100 mmol/L — ABNORMAL LOW (ref 101–111)
Creatinine, Ser: 0.5 mg/dL (ref 0.44–1.00)
GFR calc Af Amer: >60 mL/min (ref >60)
GFR calc non Af Amer: >60 mL/min (ref >60)
GLUCOSE: 148 mg/dL — AB (ref 65–99)
POTASSIUM: 4.1 mmol/L (ref 3.5–5.1)
Sodium: 135 mmol/L (ref 135–145)

## 2015-05-11 MED ORDER — TRAMADOL HCL 50 MG PO TABS: 50.0000 mg | ORAL_TABLET | Freq: Four times a day (QID) | ORAL | Status: DC | PRN

## 2015-05-11 MED ORDER — DEXAMETHASONE 4 MG PO TABS: 4.0000 mg | ORAL_TABLET | Freq: Two times a day (BID) | ORAL | Status: DC

## 2015-05-11 MED ORDER — PROCHLORPERAZINE MALEATE 10 MG PO TABS
10.0000 mg | ORAL_TABLET | Freq: Four times a day (QID) | ORAL | Status: DC | PRN
  Administered 2015-05-11: 10 mg via ORAL
  Filled 2015-05-11: qty 1

## 2015-05-11 MED ORDER — LORAZEPAM 0.5 MG PO TABS: 0.2500 mg | ORAL_TABLET | Freq: Four times a day (QID) | ORAL | Status: AC | PRN

## 2015-05-11 MED ORDER — HYDROCODONE-ACETAMINOPHEN 5-325 MG PO TABS: 1.0000 | ORAL_TABLET | Freq: Four times a day (QID) | ORAL | Status: DC | PRN

## 2015-05-11 MED ORDER — DEXAMETHASONE 4 MG PO TABS
4.0000 mg | ORAL_TABLET | Freq: Two times a day (BID) | ORAL | Status: DC
  Administered 2015-05-11 (×2): 4 mg via ORAL
  Filled 2015-05-11 (×2): qty 1

## 2015-05-11 NOTE — Clinical Social Work Placement (Signed)
   CLINICAL SOCIAL WORK PLACEMENT  NOTE  Date:  05/11/2015  Patient Details  Name: [name] MRN: [id number] Date of Birth: [birth year]  Clinical Social Work is seeking post-discharge placement for this patient at the [place] level of care (*CSW will initial, date and re-position this form in  chart as items are completed):  Yes   Patient/family provided with [place] Work Department's list of facilities offering this level of care within the geographic area requested by the patient (or if unable, by the patient's family).  Yes   Patient/family informed of their freedom to choose among providers that offer the needed level of care, that participate in Medicare, Medicaid or managed care program needed by the patient, have an available bed and are willing to accept the patient.  Yes   Patient/family informed of 's ownership interest in [facility] and [facility], as well as of the fact that they are under no obligation to receive care at these facilities.  PASRR submitted to EDS on       PASRR number received on       Existing PASRR number confirmed on 05/09/15     FL2 transmitted to all facilities in geographic area requested by pt/family on 05/09/15     FL2 transmitted to all facilities within larger geographic area on       Patient informed that his/her managed care company has contracts with or will negotiate with certain facilities, including the following:        Yes   Patient/family informed of bed offers received.  Patient chooses bed at [facility]     Physician recommends and patient chooses bed at      Patient to be transferred to [facility] on 05/11/15.  Patient to be transferred to facility by ambulance [facility])     Patient family notified on 05/11/15 of transfer.  Name of family member notified:  pt notified at bedside and pt daughter, Colletta Maryland notified via telephone      PHYSICIAN Please sign FL2, Please sign DNR     Additional Comment:    _______________________________________________ Ladell Pier, LCSW 05/11/2015, 3:18 PM

## 2015-05-11 NOTE — NC FL2 (Signed)
[place] LEVEL OF CARE SCREENING TOOL     IDENTIFICATION  Patient Name: [name] Birthdate: Dec 11, 1924 Sex: female Admission Date (Current Location): 05/07/2015  [facility] and [place] Number:  Herbalist and Address:  [facility],  [place] [address]      Provider Number: [id number]  Attending Physician Name and Address:  Orson Eva, MD  Relative Name and Phone Number:       Current Level of Care: Hospital Recommended Level of Care: [place] Prior Approval Number:    Date Approved/Denied:   PASRR Number: [id number]  Discharge Plan: SNF    Current Diagnoses: Patient Active Problem List   Diagnosis Date Noted  . Pressure ulcer 05/08/2015  . Hemiplegia ([place])   . Metastasis to brain ([place])   . Left hemiparesis ([place]) 05/07/2015  . Essential hypertension 05/07/2015  . Dilated bile duct 05/07/2015  . Anorexia 03/13/2015  . Iron deficiency anemia 03/13/2015  . Ileostomy present ([place]) 03/23/2014  . Ileostomy in place [facility]) 07/14/2013  . Other and unspecified hyperlipidemia 07/13/2013  . Nausea without vomiting 07/10/2013  . Colonic obstruction ([place]) 07/09/2013  . 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 HTN (hypertension) 07/06/2013    Orientation RESPIRATION BLADDER Height & Weight     Self, Time, Situation, Place  Normal Continent Weight: 121 lb 11.1 oz (55.2 kg) Height:  5\' 4"  (162.6 cm)  BEHAVIORAL SYMPTOMS/MOOD NEUROLOGICAL BOWEL NUTRITION STATUS   (no behaviors)  (NONE) Continent Diet (Diet Regular)  AMBULATORY STATUS COMMUNICATION OF NEEDS Skin   Extensive Assist Verbally PU Stage and Appropriate Care (Stage II to Sacrum)   PU Stage 2 Dressing: Daily                   Personal Care Assistance Level of Assistance  Bathing, Feeding, Dressing Bathing Assistance: Maximum assistance Feeding assistance: Limited assistance Dressing  Assistance: Maximum assistance     Functional Limitations Info  Sight, Hearing, Speech Sight Info: Adequate Hearing Info: Adequate Speech Info: Adequate    SPECIAL CARE FACTORS FREQUENCY  PT (By licensed PT), OT (By licensed OT)     PT Frequency: 5 x a week OT Frequency: 5 x a week            Contractures Contractures Info: Not present    Additional Factors Info  Code Status, Allergies Code Status Info: DNR code status Allergies Info: No Known Allergies           Current Medications (05/11/2015):  This is the current hospital active medication list Current Facility-Administered Medications  Medication Dose Route Frequency Provider Last Rate Last Dose  . acetaminophen (TYLENOL) tablet 650 mg  650 mg Oral Q6H PRN Bonnielee Haff, MD       Or  . acetaminophen (TYLENOL) suppository 650 mg  650 mg Rectal Q6H PRN Bonnielee Haff, MD      . amLODipine (NORVASC) tablet 5 mg  5 mg Oral Daily Eugenie Filler, MD   5 mg at 05/11/15 0942  . aspirin EC tablet 81 mg  81 mg Oral Daily Bonnielee Haff, MD   81 mg at 05/11/15 0943  . dexamethasone (DECADRON) tablet 4 mg  4 mg Oral BID Ladell Pier, MD   4 mg at 05/11/15 0943  . enoxaparin (LOVENOX) injection 40 mg  40 mg Subcutaneous Q24H Bonnielee Haff, MD   40 mg at 05/10/15 1800  . hydrALAZINE (APRESOLINE) injection 10  mg  10 mg Intravenous Q6H PRN Bonnielee Haff, MD      . HYDROcodone-acetaminophen (NORCO/VICODIN) 5-325 MG per tablet 1 tablet  1 tablet Oral Q6H PRN Bonnielee Haff, MD   1 tablet at 05/11/15 0942  . morphine 2 MG/ML injection 2-4 mg  2-4 mg Intravenous Q2H PRN Bonnielee Haff, MD   4 mg at 05/11/15 0616  . prochlorperazine (COMPAZINE) injection 10 mg  10 mg Intravenous Q6H PRN Eugenie Filler, MD   10 mg at 05/09/15 1518  . prochlorperazine (COMPAZINE) tablet 10 mg  10 mg Oral Q6H PRN Orson Eva, MD   10 mg at 05/11/15 1027  . traMADol (ULTRAM) tablet 50 mg  50 mg Oral Q6H PRN Eugenie Filler, MD          Discharge Medications: Please see discharge summary for a list of discharge medications.  Relevant Imaging Results:  Relevant Lab Results:   Additional Information SSN: [id number]. Palliative Care Services to follow at SNF-transition to hospice services once Medicaid approved at SNF.  [place], [place], LCSW

## 2015-05-11 NOTE — Progress Notes (Signed)
Called [facility]x, I spoke with Barbaraann Rondo me on hold for 10 minutes. I called back facility and left my number to call me back when nurse is ready for report.

## 2015-05-11 NOTE — Progress Notes (Signed)
IP PROGRESS NOTE  Subjective:   She has noted improvement in the left-sided weakness. She is scheduled to go to [place] center today. She developed confusion after taking Ativan.  Objective: Vital signs in last 24 hours: Blood pressure 155/71, pulse 75, temperature 98.2 F (36.8 C), temperature source Oral, resp. rate 14, height 5\' 4"  (1.626 m), weight 121 lb 11.1 oz (55.2 kg), SpO2 94 %.  Intake/Output from previous day: 03/02 0701 - 03/03 0700 In: 835 [P.O.:835] Out: 825 [Urine:825]  Physical Exam: HEENT: No thrush Abdomen: Soft, mild tenderness in the mid upper abdomen  Neurologic: Alert and oriented, left arm/hand and leg weakness-improved    Lab Results:  Recent Labs  05/09/15 0327  WBC 4.1  HGB 12.0  HCT 36.2  PLT 418*    BMET  Recent Labs  05/10/15 0351 05/11/15 0401  NA 130* 135  K 4.1 4.1  CL 99* 100*  CO2 22 25  GLUCOSE 155* 148*  BUN 15 14  CREATININE 0.59 0.50  CALCIUM 9.1 9.3    Studies/Results: Mr Kizzie Fantasia Contrast  05/10/2015  CLINICAL DATA:  Colon cancer. LEFT hemiparesis, leg greater than arm. Confusion. SRS planning EXAM: MRI HEAD WITHOUT AND WITH CONTRAST TECHNIQUE: Multiplanar, multiecho pulse sequences of the brain and surrounding structures were obtained without and with intravenous contrast. CONTRAST:  69mL MULTIHANCE GADOBENATE DIMEGLUMINE 529 MG/ML IV SOLN COMPARISON:  1.5 tesla MR 05/08/2015. FINDINGS: Redemonstrated is 14 x 18 x 18 mm (R-L x A-P x C-C) superficial, but intra-axial mass, RIGHT posterior frontal cortex, parasagittal location with surrounding vasogenic edema. There is avid postcontrast enhancement. Marked surrounding vasogenic edema with no midline shift but slight depression of the RIGHT lateral ventricle. No additional brain lesions. No osseous lesions. Extracranial soft tissues unremarkable. No significant change from previous exam. IMPRESSION: Superficial, but intra-axial, RIGHT posterior frontal  parasagittal metastasis. This appears to be a solitary lesion, approximately 2 cm. Extensive vasogenic edema but no midline shift. Electronically Signed   By: Staci Righter M.D.   On: 05/10/2015 15:32    Medications: I have reviewed the patient's current medications.  Assessment/Plan: 1. Stage IIIB (T3 N2a) poorly different it adenocarcinoma of the right colon, status post a right colectomy and end ileostomy 07/09/2013.  4 of 15 lymph nodes positive for metastatic carcinoma and 2 tumor deposits.   Cycle 1 adjuvant Xeloda 08/15/2013.   Cycle 2 adjuvant Xeloda 09/05/2013.   Cycle 3 adjuvant Xeloda with a dose reduction 10/03/2013.   Cycle 4 adjuvant Xeloda 10/25/2013.   Cycle 5 adjuvant Xeloda 11/16/2013.  Cycle 6 adjuvant Xeloda 12/06/2013  Cycle 7 of adjuvant Xeloda 12/27/2013  Cycle 8 of adjuvant Xeloda 01/17/2014.  Restaging CT scans 02/14/2014 with stable 4 to 5 mm right lower lobe pulmonary nodule; interval enlargement of left periaortic lymphadenopathy.  Ileostomy takedown 03/23/2014  Restaging CT scan 09/04/2014 with stable small pulmonary nodules. Stable and smaller mesenteric and retroperitoneal lymph nodes. No evidence of metastatic disease. 2. History of multiple nonmelanoma skin cancers. 3. History of hypertension. 4. Indeterminate 4 mm right lung base nodule on the CT abdomen 07/06/2013.  Chest CT 08/05/2013 with middle and right lower lobe nodules measuring up to 4 mm. Repeat chest CT planned at a 6-9 month interval.  Restaging chest CT 02/14/2014 with stable 4-5 mm right lower lobe pulmonary nodule. 5. Rash secondary to Xeloda. Resolved 6. 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 Resolved. 7. Iron deficiency anemia-stool Hemoccult positive 12/20/2014, colonoscopy 01/04/2015 negative other than diverticulosis  in the sigmoid colon 8. Nausea/back pain/anorexia. Symptoms persist. CT chest/abdomen/pelvis 03/30/2015 with 2 tiny stable right lung  nodules; new mild diffuse intrahepatic biliary ductal dilatation; no liver mass; new prominent gallbladder distention; new prominently dilated common bile duct; thickening and heterogeneity of the pancreatic head with new peripancreatic fat stranding extending into the central mesentery and anterior paranephric retroperitoneal spaces bilaterally; new main pancreatic duct dilatation; new diffuse left adrenal thickening was suggestion of a new 1.2 x 1.1 cm left adrenal nodule; new upper retroperitoneal lymphadenopathy.  MRI abdomen 04/09/2015 confirmed circumferential narrowing of the distal common bile duct and narrowing of the distal pancreatic duct. Severe biliary ductal dilatation  EUS 05/04/2015 confirmed a dilated common bile duct, mass near the ampulla, FNA biopsy of soft tissue lesion surrounding the bile duct-adenocarcinoma  9. Left hemiplegia-MRI brain 05/08/2015 confirmed an isolated right frontoparietal metastasis with edema, Decadron started 05/08/2015  10. Sacral decubitus ulcer   [name] has experienced partial improvement in the left hemiplegia with Decadron. She is scheduled for SRS to the brain lesion today. I will change the Decadron to by mouth.  I discussed the situation with her daughter. She will not enroll in Hospice while in the skilled nursing facility. We will make a hospice referral if she is able to return home. Ms. Lafontant will return to the Cancer center for an office visit in approximately 2 weeks. Recommendations: 1. Change Decadron to by mouth and taper per Dr. Lisbeth Renshaw 2. Transfer to skilled nursing facility after brain radiation today 3. Follow-up will be scheduled at the [facility] in approximately 2 weeks 4. Discontinue Ativan   LOS: 4 days   Betsy Coder, MD   05/11/2015, 10:23 AM

## 2015-05-11 NOTE — Progress Notes (Signed)
Patient transferred to [facility] via [place].Report given to [facility] personnel.Patient is stable,no c/o pain. In good spirits.

## 2015-05-11 NOTE — Progress Notes (Signed)
[name] 10:08 AM  Subjective: Patient doing a little better and moving her left arm little better and about to start radiation today and has no new complaints and her nausea seems to pass as the day goes on and she tends to eat more later in the day  Objective: Vital signs stable afebrile no acute distress abdomen is soft occasional bowel sounds some episodic tenderness without guarding or rebound tends to move periodically  Assessment: Metastatic adenocarcinoma  Plan: I wished her well and happy to see back when necessary and can arrange transportation from her nursing home if needed  [facility]  Pager [phone] After 5PM or if no answer call [phone]

## 2015-05-11 NOTE — Consult Note (Signed)
CC:  Weakness  HPI: [name] is a 82 y.o. female with a history of colon adenocarcinoma initially diagnosed in 2015 and treated with resection and chemo. She recently presented to her gastroenterologist with nausea and scan demonstrated dilated biliary duct with possible mass at the ampulla. She also began to experience left arm and leg weakness over the past few weeks. She was admitted and MRI was done demonstrating a right parafalcine lesion posterior to the motor strip with vasogenic edema. This lesion was thought to be amenable to [facility] and neurosurgical consult was therefore requested for planning purposes.  PMH: Past Medical History  Diagnosis Date  . Hypertension   . Hyperlipidemia   . Arthritis   . Cancer [facility])     colon    PSH: Past Surgical History  Procedure Laterality Date  . Total hip arthroplasty    . Colonoscopy with propofol N/A 07/08/2013    Procedure: COLONOSCOPY WITH PROPOFOL;  Surgeon: Wonda Horner, MD;  Location: [facility] ENDOSCOPY;  Service: Endoscopy;  Laterality: N/A;  . Partial colectomy Right 07/09/2013    Procedure: PARTIAL COLECTOMY AND EXCISION OF NEVUS FROM RIGHT ABDOMINAL WALL;  Surgeon: Earnstine Regal, MD;  Location: [facility] ORS;  Service: General;  Laterality: Right;  . Colon surgery    . Eye surgery      bilateral cataract with lens implants  . Ileostomy closure N/A 03/23/2014    Procedure: ILEOSTOMY CLOSURE;  Surgeon: Armandina Gemma, MD;  Location: [facility] ORS;  Service: General;  Laterality: N/A;  . Parastomal hernia repair N/A 03/23/2014    Procedure: HERNIA REPAIR PARASTOMAL;  Surgeon: Armandina Gemma, MD;  Location: [facility] ORS;  Service: General;  Laterality: N/A;    SH: Social History  Substance Use Topics  . Smoking status: Never Smoker   . Smokeless tobacco: Never Used  . Alcohol Use: No    MEDS: Prior to Admission medications   Medication Sig Start Date End Date Taking? Authorizing Provider  amLODipine (NORVASC) 5 MG tablet Take 5 mg by mouth daily.   02/01/13  Yes Historical Provider, MD  aspirin EC 81 MG tablet Take 81 mg by mouth daily.   Yes Historical Provider, MD  fluocinonide cream (LIDEX) [id number] % Apply 1 application topically 2 (two) times daily as needed (rash).  03/05/15  Yes Historical Provider, MD  losartan-hydrochlorothiazide (HYZAAR) 50-12.5 MG tablet Take 1 tablet by mouth daily. 05/03/15  Yes Historical Provider, MD  ondansetron (ZOFRAN) 8 MG tablet Take 1 tablet (8 mg total) by mouth every 8 (eight) hours as needed for nausea or vomiting. 05/01/15  Yes Ladell Pier, MD  dexamethasone (DECADRON) 4 MG tablet Take 1 tablet (4 mg total) by mouth 2 (two) times daily at 10 am and 4 pm. 05/11/15   Orson Eva, MD  HYDROcodone-acetaminophen (NORCO/VICODIN) 5-325 MG tablet Take 1 tablet by mouth every 6 (six) hours as needed for moderate pain. 05/11/15   Orson Eva, MD  LORazepam (ATIVAN) 0.5 MG tablet Take 0.5 tablets (0.25 mg total) by mouth every 6 (six) hours as needed for anxiety. 05/11/15   Orson Eva, MD  prochlorperazine (COMPAZINE) 5 MG tablet Take 1 tablet (5 mg total) by mouth every 6 (six) hours as needed for nausea or vomiting. 09/22/13   Owens Shark, NP  traMADol (ULTRAM) 50 MG tablet Take 1 tablet (50 mg total) by mouth every 6 (six) hours as needed (pain). 05/11/15   Orson Eva, MD    ALLERGY: No Known Allergies  ROS:  ROS  NEUROLOGIC EXAM: Awake, alert, oriented Memory and concentration grossly intact Speech fluent, appropriate CN grossly intact Motor exam: Upper Extremities Deltoid Bicep Tricep Grip  Right 5/5 5/5 5/5 5/5  Left 3/5 3/5 4-/5 3/5   Lower Extremity IP Quad PF DF EHL  Right 5/5 5/5 5/5 5/5 5/5  Left 3//5 3/5 4-/5 3/5 3/5   Sensation grossly intact to LT  [facility]: MRI demonstrates homogeneously enhancing parasagittal right parietal mass with significant surrounding edema.  IMPRESSION: - 82 y.o. female with likely metastatic adenocarcinoma to the right parietal lobe, likely responsible for her  hemiparesis. I suspect large part of her symptoms are related to the surrounding edema which may improve with continued steroid therapy.  PLAN: - Proceed with single fraction SRS to the right parietal lesion to 20Gy - Cont dexamehtasone

## 2015-05-11 NOTE — Progress Notes (Signed)
  Radiation Oncology         [phone]) [phone] ________________________________  Name: [name] MRN: [id number]  Date: 05/11/2015  DOB: 03/30/1924   SPECIAL TREATMENT PROCEDURE   3D TREATMENT PLANNING AND DOSIMETRY: The patient's radiation plan was reviewed and approved by Dr. Elenor Legato from neurosurgery and radiation oncology prior to treatment. It showed 3-dimensional radiation distributions overlaid onto the planning CT/MRI image set. The [facility] for the target structures as well as the organs at risk were reviewed. The documentation of the 3D plan and dosimetry are filed in the radiation oncology EMR.   NARRATIVE: The patient was brought to the TrueBeam stereotactic radiation treatment machine and placed supine on the CT couch. The head frame was applied, and the patient was set up for stereotactic radiosurgery. Neurosurgery was present for the set-up and delivery   SIMULATION VERIFICATION: In the couch zero-angle position, the patient underwent Exactrac imaging using the Brainlab system with orthogonal KV images. These were carefully aligned and repeated to confirm treatment position for each of the isocenters. The Exactrac snap film verification was repeated at each couch angle.   SPECIAL TREATMENT PROCEDURE: The patient received stereotactic radiosurgery to the following target:  PTV1 Rt Post Frontal Cortex 20mm target was treated using 4 Arcs to a prescription dose of 20 Gy. ExacTrac Snap verification was performed for each couch angle.   STEREOTACTIC TREATMENT MANAGEMENT: Following delivery, the patient was transported to nursing in stable condition and monitored for possible acute effects. Vital signs were recorded . The patient tolerated treatment without significant acute effects, and was discharged to home in stable condition.  PLAN: Follow-up in one month.   ------------------------------------------------  Jodelle Gross, MD, PhD

## 2015-05-11 NOTE — Progress Notes (Signed)
  Radiation Oncology         [phone] ________________________________  Name: [name] MRN: [id number]  Date: 05/09/2015  DOB: Sep 28, 1924  SIMULATION AND TREATMENT PLANNING NOTE    ICD-9-CM ICD-10-CM   1. Metastasis to brain (HCC) 198.3 C79.31     DIAGNOSIS:     ICD-9-CM ICD-10-CM   1. Metastasis to brain ([place]) 198.3 C79.31      NARRATIVE:  The patient was brought to the [facility].  Identity was confirmed.  All relevant records and images related to the planned course of therapy were reviewed.  The patient freely provided informed written consent to proceed with treatment after reviewing the details related to the planned course of therapy. The consent form was witnessed and verified by the simulation staff. Intravenous access was established for contrast administration. Then, the patient was set-up in a stable reproducible supine position for radiation therapy.  A relocatable thermoplastic stereotactic head frame was fabricated for precise immobilization.  CT images were obtained.  Surface markings were placed.  The CT images were loaded into the planning software and fused with the patient's targeting MRI scan.  Then the target and avoidance structures were contoured.  Treatment planning then occurred.  The radiation prescription was entered and confirmed.  I have requested 3D planning  I have requested a DVH of the following structures: Brain stem, brain, left eye, right eye, lenses, optic chiasm, target volumes, uninvolved brain, and normal tissue.    SPECIAL TREATMENT PROCEDURE:  The planned course of therapy using radiation constitutes a special treatment procedure. Special care is required in the management of this patient for the following reasons. This treatment constitutes a Special Treatment Procedure for the following reason: High dose per fraction requiring special monitoring for increased toxicities of treatment including daily imaging.  The special  nature of the planned course of radiotherapy will require increased physician supervision and oversight to ensure patient's safety with optimal treatment outcomes.  PLAN:  The patient will receive 20 Gy in 1 fraction.  ________________________________ ------------------------------------------------  Jodelle Gross, MD, PhD

## 2015-05-11 NOTE — Progress Notes (Signed)
Report given to Lorenso Courier ,nurse at [facility]. On d/c patient denies nausea.

## 2015-05-11 NOTE — Discharge Summary (Signed)
Physician Discharge Summary  [name] [id number] DOB: [birth year] DOA: 05/07/2015  PCP: Melinda Crutch  Admit date: 05/07/2015 Discharge date: 05/11/2015  Recommendations for Outpatient Follow-up:  1. Pt will need to follow up with PCP in 2 weeks post discharge 2. Please obtain BMP in one week  Discharge Diagnoses:  Left hemiparesis secondary to metastatic colon cancer -metastatic disease noted on MRI head which shows a 19 mm high right frontoparietal mass consistent with solitary metastases. Moderate vasogenic edema without shift.  -Continue IV Decadron--switch to po at time of d/c -Radiation oncology have been consulted to evaluate for palliative XRT  -pt will have one SRS fraction on 05/11/15 - repeat MRI brain on 05/10/15 per radiation Onc -Will need to follow-up on pathology report from duodenal biopsy at [facility] adenocarcinoma  -PT/OT.-->skilled nursing facility.  -Appreciate Drs. Sherrill and Magod -05/10/15 case discussed with Dr. 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 See problem #1.  -On need to follow-up on duodenal biopsy pathology results from [facility]. Per oncology. -Minimal CBD obstruction and duodenal partial obstruction awaiting biopsies from [facility]-->showed adenocarcinoma from EUS  nausea Likely secondary to brain metastases and CBD obstructive process -Patient started on Decadron. -scheduled Zofran 8 mg IV every 6 hours 48 hours.  -Compazine when necessary.  Hypertension Stable. Resumed home dose Norvasc.  -Hydralazine as needed. -discontinue losartan HCTZ-->BP remains stable -HCTZ contributing to hyponatremia  hyponatremia - likely due to volume depletion from poor oral intake -IV fluids-->improved   Discharge Condition: stable  Disposition:  home  Diet:regular Wt Readings from Last 3 Encounters:  05/07/15 55.2 kg (121 lb 11.1 oz)  04/19/15 59.058 kg (130 lb 3.2 oz)  03/30/15 61.553 kg (135 lb 11.2 oz)    History of present illness:  82 year old female with a history of hypertension, iron deficiency anemia, stage IIIB poorly differentiated colon and no carcinoma status post right colectomy and an ileostomy 07/09/2013 presented with left hemiparesis for the past 2 weeks. The patient recently had CT of abdomen and pelvis 03/30/2015 with 2 tiny stable right lung nodules; new mild diffuse intrahepatic biliary ductal dilatation; no liver mass; new prominent gallbladder distention; new prominently dilated common bile duct; thickening and heterogeneity of the pancreatic head with new peripancreatic fat stranding extending into the central mesentery and anterior paranephric retroperitoneal spaces bilaterally; new main pancreatic duct dilatation. MRI abdomen 04/09/2015 confirmed circumferential narrowing of the distal common bile duct and narrowing of the distal pancreatic duct. Severe biliary ductal dilatation. EUS 05/04/2015 [facility]) confirmed a dilated common bile duct, mass near the ampulla, FNA biopsy-pathology pending. Because of her left hemiparesis, MRI of the brain was obtained and revealed a new right frontal parietal mass with vasogenic edema without midline shift. Medical oncology, GI, and radiation oncology have been consulted.     Discharge Exam: Filed Vitals:   05/11/15 0600 05/11/15 0940  BP: 147/67 155/71  Pulse: 70 75  Temp: 98.2 F (36.8 C)   Resp: 14    Filed Vitals:   05/10/15 1430 05/10/15 2023 05/11/15 0600 05/11/15 0940  BP: 157/69 131/70 147/67 155/71  Pulse: 87 74 70 75  Temp: 98.6 F (37 C) 98.4 F (36.9 C) 98.2 F (36.8 C)   TempSrc: Oral Oral Oral   Resp: 16 14 14    Height:      Weight:      SpO2:  97% 94% 94%    General: A&O x 3, NAD, pleasant, cooperative Cardiovascular: RRR, no rub, no  gallop, no S3 Respiratory: diminished BS, no wheeze Abdomen:soft, nontender, nondistended, positive bowel sounds Extremities: No edema, No lymphangitis, no petechiae  Discharge Instructions      Discharge Instructions    Diet - low sodium heart healthy    Complete by:  As directed      Increase activity slowly    Complete by:  As directed             Medication List    STOP taking these medications        losartan-hydrochlorothiazide 50-12.5 MG tablet  Commonly known as:  HYZAAR      TAKE these medications        amLODipine 5 MG tablet  Commonly known as:  NORVASC  Take 5 mg by mouth daily.     aspirin EC 81 MG tablet  Take 81 mg by mouth daily.     dexamethasone 4 MG tablet  Commonly known as:  DECADRON  Take 1 tablet (4 mg total) by mouth 2 (two) times daily at 10 am and 4 pm.     fluocinonide cream 0.05 %  Commonly known as:  LIDEX  Apply 1 application topically 2 (two) times daily as needed (rash).     HYDROcodone-acetaminophen 5-325 MG tablet  Commonly known as:  NORCO/VICODIN  Take 1 tablet by mouth every 6 (six) hours as needed for moderate pain.     LORazepam 0.5 MG tablet  Commonly known as:  ATIVAN  Take 0.5 tablets (0.25 mg total) by mouth every 6 (six) hours as needed for anxiety.     ondansetron 8 MG tablet  Commonly known as:  ZOFRAN  Take 1 tablet (8 mg total) by mouth every 8 (eight) hours as needed for nausea or vomiting.     prochlorperazine 5 MG tablet  Commonly known as:  COMPAZINE  Take 1 tablet (5 mg total) by mouth every 6 (six) hours as needed for nausea or vomiting.     traMADol 50 MG tablet  Commonly known as:  ULTRAM  Take 1 tablet (50 mg total) by mouth every 6 (six) hours as needed (pain).         The results of significant diagnostics from this hospitalization (including imaging, microbiology, ancillary and laboratory) are listed below for reference.    Significant Diagnostic Studies: Mr Kizzie Fantasia  Contrast  05/10/2015  CLINICAL DATA:  Colon cancer. LEFT hemiparesis, leg greater than arm. Confusion. SRS planning EXAM: MRI HEAD WITHOUT AND WITH CONTRAST TECHNIQUE: Multiplanar, multiecho pulse sequences of the brain and surrounding structures were obtained without and with intravenous contrast. CONTRAST:  51mL MULTIHANCE GADOBENATE DIMEGLUMINE 529 MG/ML IV SOLN COMPARISON:  1.5 tesla MR 05/08/2015. FINDINGS: Redemonstrated is 14 x 18 x 18 mm (R-L x A-P x C-C) superficial, but intra-axial mass, RIGHT posterior frontal cortex, parasagittal location with surrounding vasogenic edema. There is avid postcontrast enhancement. Marked surrounding vasogenic edema with no midline shift but slight depression of the RIGHT lateral ventricle. No additional brain lesions. No osseous lesions. Extracranial soft tissues unremarkable. No significant change from previous exam. IMPRESSION: Superficial, but intra-axial, RIGHT posterior frontal parasagittal metastasis. This appears to be a solitary lesion, approximately 2 cm. Extensive vasogenic edema but no midline shift. Electronically Signed   By: Staci Righter M.D.   On: 05/10/2015 15:32   Mr [name] [id number] Contrast  05/08/2015  CLINICAL DATA:  Left hemi paresis. History of colon cancer. Metastasis versus infarct. EXAM: MRI HEAD WITHOUT AND WITH CONTRAST TECHNIQUE: Multiplanar, multiecho pulse sequences of the brain and surrounding structures were obtained without and with intravenous contrast. CONTRAST:  10 cc MultiHance intravenous COMPARISON:  None. FINDINGS: Calvarium and upper cervical spine: No definitive skeletal metastasis. Orbits: Bilateral cataract resection.  Negative for mass Sinuses and Mastoids: Clear. Brain: There is an enhancing intra-axial mass in the parasagittal right frontal parietal lobe along the upper motor strip measuring 19 mm. Moderate vasogenic edema without shift or herniation. No other masses identified. MR signal shows dense cellularity. No definitive  hemorrhagic component. No acute infarct, hemorrhage, hydrocephalus, or major vessel occlusion. Metastasis is unexpected finding, but to accelerate care these results will be called to the ordering clinician or representative by the Radiologist Assistant, and communication documented in the PACS or zVision Dashboard. IMPRESSION: 19 mm high right frontoparietal mass consistent with solitary metastasis. Moderate vasogenic edema without shift. Electronically Signed   By: Monte Fantasia M.D.   On: 05/08/2015 11:40     Microbiology: No results found for this or any previous visit (from the past 240 hour(s)).   Labs: Basic Metabolic Panel:  Recent Labs Lab 05/07/15 1129  05/08/15 0329 05/09/15 0327 05/10/15 0351 05/11/15 0401  NA 129*  --  134* 134* 130* 135  K 3.4*  < > 4.0 4.2 4.1 4.1  CL  --   --  103 101 99* 100*  CO2 23  --  20* 20* 22 25  GLUCOSE 86  --  89 139* 155* 148*  BUN 9.1  --  8 9 15 14   CREATININE 0.8  --  0.57 0.64 0.59 0.50  CALCIUM 10.0  --  9.1 9.7 9.1 9.3  < > = values in this interval not displayed. Liver Function Tests:  Recent Labs Lab 05/07/15 1129 05/08/15 0329  AST 40* 70*  ALT 41 62*  ALKPHOS 177* 189*  BILITOT 0.46 1.0  PROT 7.6 6.5  ALBUMIN 3.5 3.2*   No results for input(s): LIPASE, AMYLASE in the last 168 hours. No results for input(s): AMMONIA in the last 168 hours. CBC:  Recent Labs Lab 05/07/15 1129 05/08/15 0329 05/09/15 0327  WBC 6.9 5.3 4.1  NEUTROABS 5.0  --   --   HGB 12.5 11.4* 12.0  HCT 37.8 35.0* 36.2  MCV 84.4 85.2 85.2  PLT 452* 396 418*   Cardiac Enzymes: No results for input(s): CKTOTAL, CKMB, CKMBINDEX, TROPONINI in the last 168 hours. BNP: Invalid input(s): POCBNP CBG: No results for input(s): GLUCAP in the last 168 hours.  Time coordinating discharge:  Greater than 30 minutes  Signed:  Sheetal Lyall, DO [facility] Pager: [phone] 05/11/2015, 10:04 AM

## 2015-05-11 NOTE — Telephone Encounter (Signed)
Spoke with patient to confirm f/u appe date/time per 3/3 pof

## 2015-05-11 NOTE — Progress Notes (Signed)
Pt for discharge to [facility].  CSW facilitated pt discharge needs including contacting facility, faxing pt discharge information via epic hub, discussing with pt at bedside and pt daughter, Colletta [place] via telephone, providing RN phone number to call report, and arranging ambulance transport via [facility] for pt to [facility].  Pt and pt daughter appreciative of CSW assistance.   No further social work needs identified at this time.  CSW signing off.   Alison Murray, MSW, [place] [facility] [phone]

## 2015-05-11 NOTE — Progress Notes (Signed)
I called [facility] at this time, to give report per Mariann Laster, the nurse is not ready to take report. Will wait for nurse to call me.

## 2015-05-16 ENCOUNTER — Telehealth: Payer: Self-pay | Admitting: *Deleted

## 2015-05-16 NOTE — Telephone Encounter (Signed)
Call from pt's son asking to have [place] form sent to social security. FL2 form was completed while inpatient for admission to facility. Reviewed with inpatient social worker, facility should send FL2 to social services. Returned call to [place] with this information. He voiced appreciation for call.

## 2015-05-22 ENCOUNTER — Telehealth: Payer: Self-pay | Admitting: *Deleted

## 2015-05-22 NOTE — Telephone Encounter (Signed)
TC from pt's daughter to verify time of her mother's appt on 05/25/15. Confirmed 11:45 appt.  No other issues noted.

## 2015-05-23 ENCOUNTER — Emergency Department (HOSPITAL_COMMUNITY): Payer: Medicare Other

## 2015-05-23 ENCOUNTER — Inpatient Hospital Stay (HOSPITAL_COMMUNITY)
Admission: EM | Admit: 2015-05-23 | Discharge: 2015-05-31 | DRG: 871 | Disposition: A | Discharge status: Hospice | Payer: Medicare Other | Attending: Internal Medicine | Admitting: Internal Medicine

## 2015-05-23 ENCOUNTER — Encounter (HOSPITAL_COMMUNITY): Payer: Self-pay | Admitting: Emergency Medicine

## 2015-05-23 DIAGNOSIS — C183 Malignant neoplasm of hepatic flexure: Secondary | ICD-10-CM | POA: Diagnosis present

## 2015-05-23 DIAGNOSIS — E872 Acidosis, unspecified: Secondary | ICD-10-CM

## 2015-05-23 DIAGNOSIS — R4182 Altered mental status, unspecified: Secondary | ICD-10-CM

## 2015-05-23 DIAGNOSIS — R7881 Bacteremia: Secondary | ICD-10-CM

## 2015-05-23 DIAGNOSIS — Z801 Family history of malignant neoplasm of trachea, bronchus and lung: Secondary | ICD-10-CM

## 2015-05-23 DIAGNOSIS — G936 Cerebral edema: Secondary | ICD-10-CM | POA: Diagnosis present

## 2015-05-23 DIAGNOSIS — G9341 Metabolic encephalopathy: Secondary | ICD-10-CM | POA: Diagnosis present

## 2015-05-23 DIAGNOSIS — C7931 Secondary malignant neoplasm of brain: Secondary | ICD-10-CM | POA: Diagnosis present

## 2015-05-23 DIAGNOSIS — K5900 Constipation, unspecified: Secondary | ICD-10-CM | POA: Diagnosis present

## 2015-05-23 DIAGNOSIS — R64 Cachexia: Secondary | ICD-10-CM | POA: Diagnosis present

## 2015-05-23 DIAGNOSIS — G8194 Hemiplegia, unspecified affecting left nondominant side: Secondary | ICD-10-CM | POA: Diagnosis present

## 2015-05-23 DIAGNOSIS — A419 Sepsis, unspecified organism: Secondary | ICD-10-CM | POA: Diagnosis present

## 2015-05-23 DIAGNOSIS — R000 Tachycardia, unspecified: Secondary | ICD-10-CM | POA: Diagnosis present

## 2015-05-23 DIAGNOSIS — Z515 Encounter for palliative care: Secondary | ICD-10-CM | POA: Insufficient documentation

## 2015-05-23 DIAGNOSIS — A4901 Methicillin susceptible Staphylococcus aureus infection, unspecified site: Secondary | ICD-10-CM | POA: Diagnosis present

## 2015-05-23 DIAGNOSIS — R112 Nausea with vomiting, unspecified: Secondary | ICD-10-CM | POA: Insufficient documentation

## 2015-05-23 DIAGNOSIS — A4101 Sepsis due to Methicillin susceptible Staphylococcus aureus: Principal | ICD-10-CM | POA: Diagnosis present

## 2015-05-23 DIAGNOSIS — Z923 Personal history of irradiation: Secondary | ICD-10-CM

## 2015-05-23 DIAGNOSIS — Z9049 Acquired absence of other specified parts of digestive tract: Secondary | ICD-10-CM

## 2015-05-23 DIAGNOSIS — K831 Obstruction of bile duct: Secondary | ICD-10-CM | POA: Diagnosis present

## 2015-05-23 DIAGNOSIS — Z66 Do not resuscitate: Secondary | ICD-10-CM | POA: Insufficient documentation

## 2015-05-23 DIAGNOSIS — R109 Unspecified abdominal pain: Secondary | ICD-10-CM | POA: Insufficient documentation

## 2015-05-23 DIAGNOSIS — E869 Volume depletion, unspecified: Secondary | ICD-10-CM | POA: Diagnosis present

## 2015-05-23 DIAGNOSIS — N1330 Unspecified hydronephrosis: Secondary | ICD-10-CM | POA: Diagnosis present

## 2015-05-23 DIAGNOSIS — R0600 Dyspnea, unspecified: Secondary | ICD-10-CM

## 2015-05-23 DIAGNOSIS — Z7982 Long term (current) use of aspirin: Secondary | ICD-10-CM

## 2015-05-23 DIAGNOSIS — R7989 Other specified abnormal findings of blood chemistry: Secondary | ICD-10-CM

## 2015-05-23 DIAGNOSIS — C779 Secondary and unspecified malignant neoplasm of lymph node, unspecified: Secondary | ICD-10-CM | POA: Diagnosis present

## 2015-05-23 DIAGNOSIS — I10 Essential (primary) hypertension: Secondary | ICD-10-CM | POA: Diagnosis present

## 2015-05-23 DIAGNOSIS — C182 Malignant neoplasm of ascending colon: Secondary | ICD-10-CM

## 2015-05-23 DIAGNOSIS — N390 Urinary tract infection, site not specified: Secondary | ICD-10-CM | POA: Diagnosis present

## 2015-05-23 DIAGNOSIS — I2489 Other forms of acute ischemic heart disease: Secondary | ICD-10-CM | POA: Diagnosis present

## 2015-05-23 DIAGNOSIS — I4891 Unspecified atrial fibrillation: Secondary | ICD-10-CM | POA: Diagnosis present

## 2015-05-23 DIAGNOSIS — E871 Hypo-osmolality and hyponatremia: Secondary | ICD-10-CM | POA: Diagnosis present

## 2015-05-23 DIAGNOSIS — Z681 Body mass index (BMI) 19 or less, adult: Secondary | ICD-10-CM

## 2015-05-23 DIAGNOSIS — R630 Anorexia: Secondary | ICD-10-CM | POA: Diagnosis present

## 2015-05-23 DIAGNOSIS — R001 Bradycardia, unspecified: Secondary | ICD-10-CM | POA: Diagnosis present

## 2015-05-23 DIAGNOSIS — I248 Other forms of acute ischemic heart disease: Secondary | ICD-10-CM | POA: Diagnosis present

## 2015-05-23 DIAGNOSIS — Z7952 Long term (current) use of systemic steroids: Secondary | ICD-10-CM

## 2015-05-23 DIAGNOSIS — I214 Non-ST elevation (NSTEMI) myocardial infarction: Secondary | ICD-10-CM

## 2015-05-23 LAB — CBC WITH DIFFERENTIAL/PLATELET
BASOS ABS: 0 10*3/uL (ref 0.0–0.1)
Basophils Relative: 0 %
Eosinophils Absolute: 0 10*3/uL (ref 0.0–0.7)
Eosinophils Relative: 0 %
HEMATOCRIT: 39.6 % (ref 36.0–46.0)
HEMOGLOBIN: 13.3 g/dL (ref 12.0–15.0)
LYMPHS ABS: 0.6 10*3/uL — AB (ref 0.7–4.0)
LYMPHS PCT: 3 %
MCH: 29 pg (ref 26.0–34.0)
MCHC: 33.6 g/dL (ref 30.0–36.0)
MCV: 86.3 fL (ref 78.0–100.0)
MONO ABS: 0.9 10*3/uL (ref 0.1–1.0)
MONOS PCT: 4 %
NEUTROS ABS: 21.2 10*3/uL — AB (ref 1.7–7.7)
Neutrophils Relative %: 94 %
Platelets: 245 10*3/uL (ref 150–400)
RBC: 4.59 MIL/uL (ref 3.87–5.11)
RDW: 16.6 % — ABNORMAL HIGH (ref 11.5–15.5)
WBC: 22.6 10*3/uL — ABNORMAL HIGH (ref 4.0–10.5)

## 2015-05-23 LAB — I-STAT CG4 LACTIC ACID, ED: Lactic Acid, Venous: 2.27 mmol/L (ref 0.5–2.0)

## 2015-05-23 LAB — URINALYSIS, ROUTINE W REFLEX MICROSCOPIC
Bilirubin Urine: NEGATIVE
GLUCOSE, UA: NEGATIVE mg/dL
Ketones, ur: NEGATIVE mg/dL
Nitrite: POSITIVE — AB
PROTEIN: 100 mg/dL — AB
Specific Gravity, Urine: 1.028 (ref 1.005–1.030)
pH: 6.5 (ref 5.0–8.0)

## 2015-05-23 LAB — COMPREHENSIVE METABOLIC PANEL
ALBUMIN: 2.5 g/dL — AB (ref 3.5–5.0)
ALK PHOS: 80 U/L (ref 38–126)
ALT: 20 U/L (ref 14–54)
AST: 20 U/L (ref 15–41)
Anion gap: 13 (ref 5–15)
BILIRUBIN TOTAL: 0.9 mg/dL (ref 0.3–1.2)
BUN: 20 mg/dL (ref 6–20)
CHLORIDE: 102 mmol/L (ref 101–111)
CO2: 20 mmol/L — AB (ref 22–32)
CREATININE: 0.64 mg/dL (ref 0.44–1.00)
Calcium: 8.9 mg/dL (ref 8.9–10.3)
GFR calc Af Amer: >60 mL/min (ref >60)
GLUCOSE: 123 mg/dL — AB (ref 65–99)
Potassium: 4.2 mmol/L (ref 3.5–5.1)
Sodium: 135 mmol/L (ref 135–145)
Total Protein: 6 g/dL — ABNORMAL LOW (ref 6.5–8.1)

## 2015-05-23 LAB — PROTIME-INR
INR: 1.1 (ref 0.00–1.49)
PROTHROMBIN TIME: 14.4 s (ref 11.6–15.2)

## 2015-05-23 LAB — URINE MICROSCOPIC-ADD ON

## 2015-05-23 LAB — I-STAT TROPONIN, ED: Troponin i, poc: 0.49 ng/mL (ref 0.00–0.08)

## 2015-05-23 MED ORDER — VANCOMYCIN HCL IN DEXTROSE 1-5 GM/200ML-% IV SOLN
1000.0000 mg | INTRAVENOUS | Status: AC
  Administered 2015-05-23: 1000 mg via INTRAVENOUS
  Filled 2015-05-23: qty 200

## 2015-05-23 MED ORDER — ACETAMINOPHEN 650 MG RE SUPP
650.0000 mg | Freq: Once | RECTAL | Status: AC
  Administered 2015-05-23: 650 mg via RECTAL
  Filled 2015-05-23: qty 1

## 2015-05-23 MED ORDER — PIPERACILLIN-TAZOBACTAM 3.375 G IVPB 30 MIN
3.3750 g | INTRAVENOUS | Status: AC
  Administered 2015-05-23: 3.375 g via INTRAVENOUS
  Filled 2015-05-23: qty 50

## 2015-05-23 MED ORDER — SODIUM CHLORIDE 0.9 % IV BOLUS (SEPSIS)
500.0000 mL | Freq: Once | INTRAVENOUS | Status: AC
  Administered 2015-05-23: 500 mL via INTRAVENOUS

## 2015-05-23 MED ORDER — SODIUM CHLORIDE 0.9 % IV BOLUS (SEPSIS)
1000.0000 mL | Freq: Once | INTRAVENOUS | Status: AC
  Administered 2015-05-23: 1000 mL via INTRAVENOUS

## 2015-05-23 MED ORDER — SODIUM CHLORIDE 0.9 % IV SOLN
INTRAVENOUS | Status: DC
  Administered 2015-05-24 – 2015-05-25 (×5): via INTRAVENOUS

## 2015-05-23 MED ORDER — PIPERACILLIN-TAZOBACTAM 3.375 G IVPB: 3.3750 g | Freq: Three times a day (TID) | INTRAVENOUS | Status: DC

## 2015-05-23 MED ORDER — HEPARIN SODIUM (PORCINE) 5000 UNIT/ML IJ SOLN
60.0000 [IU]/kg | INTRAMUSCULAR | Status: AC
  Administered 2015-05-24: 2650 [IU] via INTRAVENOUS
  Filled 2015-05-23: qty 0.53

## 2015-05-23 MED ORDER — VANCOMYCIN HCL IN DEXTROSE 750-5 MG/150ML-% IV SOLN: 750.0000 mg | INTRAVENOUS | Status: DC

## 2015-05-23 MED ORDER — ASPIRIN 300 MG RE SUPP
300.0000 mg | Freq: Once | RECTAL | Status: AC
  Administered 2015-05-23: 300 mg via RECTAL
  Filled 2015-05-23: qty 1

## 2015-05-23 NOTE — ED Provider Notes (Signed)
  Face-to-face evaluation   History: She presents for evaluation of altered mental status and chest pain. Apparently earlier today, she told her son that she had chest pain. Later today, she was too confused to say anything.  Physical exam: Elderly, somewhat obtunded, unable to verbalize discomfort. Heart tachycardic. Lungs clear anteriorly. Abdomen soft.  Medical screening examination/treatment/procedure(s) were conducted as a shared visit with non-physician practitioner(s) and myself.  I personally evaluated the patient during the encounter  Daleen Bo, MD 05/25/15 0009

## 2015-05-23 NOTE — ED Notes (Signed)
Writer notifed EDP Wentz of I-stat trop result.

## 2015-05-23 NOTE — ED Notes (Signed)
Lucio Edward, PA made aware of patient lactic result.

## 2015-05-23 NOTE — ED Provider Notes (Signed)
CSN: [id number]     Arrival date & time 05/23/15  1943 History   First MD Initiated Contact with Patient 05/23/15 2028     Chief Complaint  Patient presents with  . Altered Mental Status  . Fever    100.4 oral   . Urinary Tract Infection     (Consider location/radiation/quality/duration/timing/severity/associated sxs/prior Treatment) The history is provided by a relative (the pt's son). The history is limited by the condition of the patient.     [name] is a 82 y/o female with pmhx of HTN, HLD, colorectal cancer with mets to brain, tx with radiation tx 2 weeks ago, she presents to the ER with AMS and was last known normal at 71 am when she called her son and complained of CP.  The patient's son later saw her at 25 PM when he was done with work and she had altered mental status and dysphagia, which is new. She was evaluated by the nurse and physician at the rehabilitation facility which she is staying, they sent her to the ER for evaluation of UTI/AMS.   History obtained from triage notes and rehabilitation facility States that she resides at [facility] is noted to have mental status, fever and strong smell suspicious for UTI.  Pt has several months history of decreased by mouth intake and weight loss due to persistent nausea, which is attributed to cancer in the abdomen and in the brain. Pt's at her baseline cannot  ambulate without assistance.    Level 5 caveat - AMS  Past Medical History  Diagnosis Date  . Hypertension   . Hyperlipidemia   . Arthritis   . Cancer [facility])     colon   Past Surgical History  Procedure Laterality Date  . Total hip arthroplasty    . Colonoscopy with propofol N/A 07/08/2013    Procedure: COLONOSCOPY WITH PROPOFOL;  Surgeon: Wonda Horner, MD;  Location: [facility];  Service: Endoscopy;  Laterality: N/A;  . Partial colectomy Right 07/09/2013    Procedure: PARTIAL COLECTOMY AND EXCISION OF NEVUS FROM RIGHT ABDOMINAL  WALL;  Surgeon: Earnstine Regal, MD;  Location: [facility] ORS;  Service: General;  Laterality: Right;  . Colon surgery    . Eye surgery      bilateral cataract with lens implants  . Ileostomy closure N/A 03/23/2014    Procedure: ILEOSTOMY CLOSURE;  Surgeon: Armandina Gemma, MD;  Location: [facility];  Service: General;  Laterality: N/A;  . Parastomal hernia repair N/A 03/23/2014    Procedure: HERNIA REPAIR PARASTOMAL;  Surgeon: Armandina Gemma, MD;  Location: [facility] ORS;  Service: General;  Laterality: N/A;   Family History  Problem Relation Age of Onset  . Lung cancer Sister    Social History  Substance Use Topics  . Smoking status: Never Smoker   . Smokeless tobacco: Never Used  . Alcohol Use: No   OB History    No data available     Review of Systems  Unable to perform ROS: Mental status change  All other systems reviewed and are negative.     Allergies  Review of patient's allergies indicates no known allergies.  Home Medications   Prior to Admission medications   Medication Sig Start Date End Date Taking? Authorizing Provider  acetaminophen (TYLENOL) 650 MG CR tablet Take 650 mg by mouth every 8 (eight) hours as needed for pain.   Yes Historical Provider, MD  amLODipine (NORVASC) 5 MG tablet Take 5 mg by  mouth daily.  02/01/13  Yes Historical Provider, MD  aspirin EC 81 MG tablet Take 81 mg by mouth daily.   Yes Historical Provider, MD  dexamethasone (DECADRON) 4 MG tablet Take 1 tablet (4 mg total) by mouth 2 (two) times daily at 10 am and 4 pm. 05/11/15  Yes Orson Eva, MD  Morphine Sulfate (ROXANOL PO) Take 5 mLs by mouth every 4 (four) hours as needed (pain).   Yes Historical Provider, MD  prochlorperazine (COMPAZINE) 5 MG tablet Take 1 tablet (5 mg total) by mouth every 6 (six) hours as needed for nausea or vomiting. Patient taking differently: Take 2.5 mg by mouth 3 (three) times daily.  09/22/13  Yes Owens Shark, NP  senna (SENOKOT) 8.6 MG tablet Take 1 tablet by mouth 2 (two) times daily.    Yes Historical Provider, MD  traMADol (ULTRAM) 50 MG tablet Take 1 tablet (50 mg total) by mouth every 6 (six) hours as needed (pain). 05/11/15  Yes Orson Eva, MD  fluocinonide cream (LIDEX) [id number] % Apply 1 application topically 2 (two) times daily as needed (rash).  03/05/15   Historical Provider, MD  HYDROcodone-acetaminophen (NORCO/VICODIN) 5-325 MG tablet Take 1 tablet by mouth every 6 (six) hours as needed for moderate pain. Patient not taking: Reported on 05/23/2015 05/11/15   Orson Eva, MD  LORazepam (ATIVAN) 0.5 MG tablet Take 0.5 tablets (0.25 mg total) by mouth every 6 (six) hours as needed for anxiety. 05/11/15   Orson Eva, MD  ondansetron (ZOFRAN) 8 MG tablet Take 1 tablet (8 mg total) by mouth every 8 (eight) hours as needed for nausea or vomiting. 05/01/15   Ladell Pier, MD   BP 143/93 mmHg  Pulse 118  Temp(Src) 102.8 F (39.3 C) (Rectal)  Resp 20  Ht 5\' 4"  (1.626 m)  Wt 44.453 kg  BMI 16.81 kg/m2  SpO2 96% Physical Exam  Constitutional: She appears well-developed. She appears cachectic. She is active and uncooperative.  Non-toxic appearance. She has a sickly appearance. No distress.  Cachectic elderly female, appears distressed and disoriented, will not follow commands, continues to call everyone "Colletta [place]"   HENT:  Head: Normocephalic and atraumatic.  Nose: Nose normal.  Mouth/Throat: Mucous membranes are not pale, dry and not cyanotic.  Eyes: Conjunctivae and lids are normal. Pupils are equal, round, and reactive to light. Right eye exhibits no discharge. Left eye exhibits no discharge. No scleral icterus.  Neck: Normal range of motion. No JVD present. No tracheal deviation present. No thyromegaly present.  Cardiovascular: Regular rhythm, normal heart sounds and intact distal pulses.  Tachycardia present.  Exam reveals no gallop and no friction rub.   No murmur heard. Pulses:      Radial pulses are 2+ on the right side, and 2+ on the left side.  No LE edema   Pulmonary/Chest: Effort normal and breath sounds normal. No respiratory distress. She has no wheezes. She has no rales. She exhibits no tenderness.  Abdominal: Soft. Normal appearance and bowel sounds are normal. She exhibits no distension and no mass. There is no tenderness. There is no rigidity, no rebound and no guarding.  Musculoskeletal: Normal range of motion. She exhibits no edema or tenderness.  Lymphadenopathy:    She has no cervical adenopathy.  Neurological: She has normal reflexes. She is disoriented. She displays no tremor. She exhibits normal muscle tone. She displays no seizure activity. GCS eye subscore is 4. GCS verbal subscore is 4. GCS motor subscore is 5.  Alert,  disoriented Withdrawal to pain, moves all extremities Will not follow commands Unable to assess cranial nerves, gait and coordination  Skin: Skin is warm and dry. No rash noted. She is not diaphoretic. No cyanosis or erythema. There is pallor. Nails show no clubbing.  Psychiatric: She has a normal mood and affect. Her behavior is normal. Judgment and thought content normal.  Nursing note and vitals reviewed.   ED Course  Procedures (including critical care time) Labs Review Labs Reviewed  CBC WITH DIFFERENTIAL/PLATELET - Abnormal; Notable for the following:    WBC 22.6 (*)    RDW 16.6 (*)    Neutro Abs 21.2 (*)    Lymphs Abs 0.6 (*)    All other components within normal limits  COMPREHENSIVE METABOLIC PANEL - Abnormal; Notable for the following:    CO2 20 (*)    Glucose, Bld 123 (*)    Total Protein 6.0 (*)    Albumin 2.5 (*)    All other components within normal limits  URINALYSIS, ROUTINE W REFLEX MICROSCOPIC (NOT AT [facility]) - Abnormal; Notable for the following:    Color, Urine AMBER (*)    APPearance CLOUDY (*)    Hgb urine dipstick MODERATE (*)    Protein, ur 100 (*)    Nitrite POSITIVE (*)    Leukocytes, UA MODERATE (*)    All other components within normal limits  URINE MICROSCOPIC-ADD ON -  Abnormal; Notable for the following:    Squamous Epithelial / LPF 0-5 (*)    Bacteria, UA MANY (*)    Casts HYALINE CASTS (*)    All other components within normal limits  TROPONIN I - Abnormal; Notable for the following:    Troponin I 0.45 (*)    All other components within normal limits  BRAIN NATRIURETIC PEPTIDE - Abnormal; Notable for the following:    B Natriuretic Peptide 332.5 (*)    All other components within normal limits  I-STAT CG4 LACTIC ACID, ED - Abnormal; Notable for the following:    Lactic Acid, Venous 2.27 (*)    All other components within normal limits  I-STAT TROPOININ, ED - Abnormal; Notable for the following:    Troponin i, poc 0.49 (*)    All other components within normal limits  URINE CULTURE  CULTURE, BLOOD (ROUTINE X 2)  CULTURE, BLOOD (ROUTINE X 2)  PROTIME-INR  LACTIC ACID, PLASMA  PROCALCITONIN  LACTIC ACID, PLASMA  TROPONIN I  TROPONIN I  CBG MONITORING, ED    Imaging Review Dg Chest 2 View  05/23/2015  CLINICAL DATA:  Altered mental status, weakness and fever. History of colon cancer. EXAM: CHEST  2 VIEW COMPARISON:  Chest x-ray dated 07/06/2013. FINDINGS: Mild cardiomegaly is stable. Atherosclerotic changes again noted at the aortic arch. Vague opacity at the left costophrenic angle is grossly stable, most compatible with chronic atelectasis or chronic pleural thickening. Lungs are otherwise clear. No evidence of pneumonia. Mild degenerative change again noted within the thoracic spine. Degenerative changes also noted at each shoulder. No acute- appearing osseous abnormality. IMPRESSION: No active cardiopulmonary disease. Electronically Signed   By: Franki Cabot M.D.   On: 05/23/2015 21:41   Ct Head Wo Contrast  05/23/2015  CLINICAL DATA:  Acute onset of altered level of consciousness. Fever and malodorous urine. Nausea and vomiting. Initial encounter. EXAM: CT HEAD WITHOUT CONTRAST TECHNIQUE: Contiguous axial images were obtained from the base of  the skull through the vertex without intravenous contrast. COMPARISON:  MRI of the brain performed  05/10/2015 FINDINGS: A 1.9 cm right parasagittal mass is again noted at the convexity, with surrounding decreased attenuation within the white matter. No midline shift is seen. Mild periventricular white matter change likely reflects small vessel ischemic microangiopathy. There is no evidence of acute intra or extra-axial hemorrhage. There is no evidence for acute infarct. The brainstem and fourth ventricle are within normal limits. The basal ganglia are unremarkable in appearance. The cerebral hemispheres demonstrate grossly normal gray-white differentiation. No mass effect or midline shift is seen. There is no evidence of fracture; visualized osseous structures are unremarkable in appearance. The orbits are within normal limits. The paranasal sinuses and mastoid air cells are well-aerated. No significant soft tissue abnormalities are seen. IMPRESSION: 1. 1.9 cm right parasagittal mass at the convexity, with surrounding decreased attenuation within the white matter. No midline shift seen. 2. Mild small vessel ischemic microangiopathy. Electronically Signed   By: Garald Balding M.D.   On: 05/23/2015 21:58   I have personally reviewed and evaluated these images and lab results as part of my medical decision-making.   EKG Interpretation   Date/Time:  Thursday May 24 2015 00:23:44 EDT Ventricular Rate:  106 PR Interval:  158 QRS Duration: 81 QT Interval:  345 QTC Calculation: 458 R Axis:   -12 Text Interpretation:  Sinus tachycardia Atrial premature complex  Borderline low voltage, extremity leads Nonspecific T abnormalities,  anterior leads No significant change was found Confirmed by MOLPUS  MD,  Jenny Reichmann ([id number]) on 05/24/2015 1:30:25 AM      MDM   82 y/o female presents to the ER with AMS and fever, last known normal was at approximately 10 am when she called her son and complained of chest  pain.  AMS/  sepsis (UTI most likely source per rehab facility) and CP w/up ordered. Head CT and CXR obtained.  IVF 30 mL/kg and unknown source Abx ordered and administered after BC x 2 were obtained.  Patient's results were significant for elevated troponin of 0.49, lactic acid 2.27, urinalysis consistent with UTI, leukocytosis of 22.6.  Head CT was negative for acute intracranial pathology, pertinent for 1.9 cm right parasite mass which was previously seen on MRI, no midline shift, also pertinent for mild small vessel ischemic microangiopathy.  Chest x-ray is negative.  Unable to give ASA due to pt's AMS and dysphagia.  Pt was given heparin in ED - med decision was discussed with hospitalist prior to administration, and he agrees to initiation now. Dr. Myna Hidalgo will admit to SDU for further work up and treatment.  Pt does have MOST form, DNR.  Findings reviewed with the pt's son multiple times, he is aware of positive troponin and infection.  All questions asked and answered.  Pt's son is Minna Merritts, he is POA, as is his sister Colletta [place], numbers both in the chart.    In the ER pt's mental status improved, pt was able to identify and speak with her son.  She appeared more comfortable and less agitated.    Final diagnoses:  NSTEMI (non-ST elevated myocardial infarction) ([place])  Altered mental status, unspecified altered mental status type  UTI (lower urinary tract infection)  Sepsis, due to unspecified organism [facility])   CRITICAL CARE Performed by: Delsa Grana Total critical care time: 45  Critical care time was exclusive of separately billable procedures and treating other patients. Critical care was necessary to treat or prevent imminent or life-threatening deterioration. Critical care was time spent personally by me on the following activities: development of treatment  plan with patient and/or surrogate as well as nursing, discussions with consultants, evaluation of patient's response to treatment,  examination of patient, obtaining history from patient or surrogate, ordering and performing treatments and interventions, ordering and review of laboratory studies, ordering and review of radiographic studies, pulse oximetry and re-evaluation of patient's condition.   Delsa Grana, PA-C 05/24/15 0309  Daleen Bo, MD 05/25/15 [phone]

## 2015-05-23 NOTE — Progress Notes (Signed)
Pharmacy Antibiotic Note  [name] is a 82 y.o. female admitted on 05/23/2015 with sepsis.  Pharmacy has been consulted for Zosyn and Vancomycin dosing.  Plan:  Zosyn 3.375g IV Q8H infused over 4hrs.  Vancomycin 1g IV x1 dose, then 750mg  IV q24h.  Measure Vanc trough at steady state.  Follow up renal fxn, culture results, and clinical course.   Height: 5\' 4"  (162.6 cm) Weight: 98 lb (44.453 kg) IBW/kg (Calculated) : 54.7  Temp (24hrs), Avg:97.1 F (36.2 C), Min:97 F (36.1 C), Max:97.1 F (36.2 C)   Recent Labs Lab 05/23/15 2131  LATICACIDVEN 2.27*    Estimated Creatinine Clearance: 33.5 mL/min (by C-G formula based on Cr of 0.5).    No Known Allergies  Antimicrobials this admission: 3/15 Zosyn >>  3/15 Vancomycin >>   Dose adjustments this admission:  Microbiology results: 3/15 BCx: ordered 3/15 UCx: ordered   Thank you for allowing pharmacy to be a part of this patient's care.  Gretta Arab PharmD, BCPS Pager [phone] 05/23/2015 9:48 PM

## 2015-05-23 NOTE — ED Notes (Addendum)
82 yr old Pt comes to ed via EMS, [facility]. C/o of altered level con, fever 100.4 and strong smell of possible urinary tract infection. 20 in [place].  Hx of metastatic cancer and left side weakness baseline. Can drink fluids but has not been eating at the facility. Nausea and vomiting common at baseline. Pt does not really understand verbalized questions. bp 125/85, hr 120, HTN and has not taken her bp meds today. No known allergies. Can not ambulate to restroom alone. Pt has had radiation treatment and has a MOST, DNR. Family at bedside. cbg 120.

## 2015-05-24 ENCOUNTER — Inpatient Hospital Stay (HOSPITAL_COMMUNITY): Payer: Medicare Other

## 2015-05-24 ENCOUNTER — Telehealth: Payer: Self-pay | Admitting: *Deleted

## 2015-05-24 ENCOUNTER — Encounter (HOSPITAL_COMMUNITY): Payer: Self-pay | Admitting: Radiology

## 2015-05-24 DIAGNOSIS — I248 Other forms of acute ischemic heart disease: Secondary | ICD-10-CM | POA: Diagnosis not present

## 2015-05-24 DIAGNOSIS — A419 Sepsis, unspecified organism: Secondary | ICD-10-CM | POA: Diagnosis not present

## 2015-05-24 DIAGNOSIS — I4891 Unspecified atrial fibrillation: Secondary | ICD-10-CM | POA: Diagnosis not present

## 2015-05-24 DIAGNOSIS — Z923 Personal history of irradiation: Secondary | ICD-10-CM | POA: Diagnosis not present

## 2015-05-24 DIAGNOSIS — I10 Essential (primary) hypertension: Secondary | ICD-10-CM | POA: Diagnosis not present

## 2015-05-24 DIAGNOSIS — R64 Cachexia: Secondary | ICD-10-CM | POA: Diagnosis present

## 2015-05-24 DIAGNOSIS — E872 Acidosis: Secondary | ICD-10-CM | POA: Diagnosis present

## 2015-05-24 DIAGNOSIS — Z7982 Long term (current) use of aspirin: Secondary | ICD-10-CM | POA: Diagnosis not present

## 2015-05-24 DIAGNOSIS — R1084 Generalized abdominal pain: Secondary | ICD-10-CM | POA: Diagnosis not present

## 2015-05-24 DIAGNOSIS — C182 Malignant neoplasm of ascending colon: Secondary | ICD-10-CM | POA: Diagnosis not present

## 2015-05-24 DIAGNOSIS — R630 Anorexia: Secondary | ICD-10-CM | POA: Diagnosis present

## 2015-05-24 DIAGNOSIS — Z9049 Acquired absence of other specified parts of digestive tract: Secondary | ICD-10-CM | POA: Diagnosis not present

## 2015-05-24 DIAGNOSIS — C183 Malignant neoplasm of hepatic flexure: Secondary | ICD-10-CM | POA: Diagnosis present

## 2015-05-24 DIAGNOSIS — R791 Abnormal coagulation profile: Secondary | ICD-10-CM | POA: Diagnosis not present

## 2015-05-24 DIAGNOSIS — R000 Tachycardia, unspecified: Secondary | ICD-10-CM | POA: Diagnosis present

## 2015-05-24 DIAGNOSIS — E871 Hypo-osmolality and hyponatremia: Secondary | ICD-10-CM | POA: Diagnosis present

## 2015-05-24 DIAGNOSIS — K5900 Constipation, unspecified: Secondary | ICD-10-CM | POA: Diagnosis present

## 2015-05-24 DIAGNOSIS — K831 Obstruction of bile duct: Secondary | ICD-10-CM | POA: Diagnosis present

## 2015-05-24 DIAGNOSIS — G8194 Hemiplegia, unspecified affecting left nondominant side: Secondary | ICD-10-CM | POA: Diagnosis present

## 2015-05-24 DIAGNOSIS — N1330 Unspecified hydronephrosis: Secondary | ICD-10-CM | POA: Diagnosis present

## 2015-05-24 DIAGNOSIS — Z7952 Long term (current) use of systemic steroids: Secondary | ICD-10-CM | POA: Diagnosis not present

## 2015-05-24 DIAGNOSIS — I214 Non-ST elevation (NSTEMI) myocardial infarction: Secondary | ICD-10-CM | POA: Diagnosis not present

## 2015-05-24 DIAGNOSIS — G936 Cerebral edema: Secondary | ICD-10-CM | POA: Diagnosis present

## 2015-05-24 DIAGNOSIS — I213 ST elevation (STEMI) myocardial infarction of unspecified site: Secondary | ICD-10-CM | POA: Diagnosis not present

## 2015-05-24 DIAGNOSIS — C779 Secondary and unspecified malignant neoplasm of lymph node, unspecified: Secondary | ICD-10-CM | POA: Diagnosis present

## 2015-05-24 DIAGNOSIS — R7881 Bacteremia: Secondary | ICD-10-CM | POA: Diagnosis not present

## 2015-05-24 DIAGNOSIS — B9561 Methicillin susceptible Staphylococcus aureus infection as the cause of diseases classified elsewhere: Secondary | ICD-10-CM | POA: Diagnosis not present

## 2015-05-24 DIAGNOSIS — Z515 Encounter for palliative care: Secondary | ICD-10-CM | POA: Diagnosis present

## 2015-05-24 DIAGNOSIS — I480 Paroxysmal atrial fibrillation: Secondary | ICD-10-CM | POA: Diagnosis not present

## 2015-05-24 DIAGNOSIS — A4901 Methicillin susceptible Staphylococcus aureus infection, unspecified site: Secondary | ICD-10-CM | POA: Diagnosis present

## 2015-05-24 DIAGNOSIS — Z801 Family history of malignant neoplasm of trachea, bronchus and lung: Secondary | ICD-10-CM | POA: Diagnosis not present

## 2015-05-24 DIAGNOSIS — R778 Other specified abnormalities of plasma proteins: Secondary | ICD-10-CM | POA: Diagnosis not present

## 2015-05-24 DIAGNOSIS — N390 Urinary tract infection, site not specified: Secondary | ICD-10-CM | POA: Diagnosis present

## 2015-05-24 DIAGNOSIS — C189 Malignant neoplasm of colon, unspecified: Secondary | ICD-10-CM | POA: Diagnosis not present

## 2015-05-24 DIAGNOSIS — R4182 Altered mental status, unspecified: Secondary | ICD-10-CM | POA: Diagnosis present

## 2015-05-24 DIAGNOSIS — Z681 Body mass index (BMI) 19 or less, adult: Secondary | ICD-10-CM | POA: Diagnosis not present

## 2015-05-24 DIAGNOSIS — G9341 Metabolic encephalopathy: Secondary | ICD-10-CM | POA: Diagnosis present

## 2015-05-24 DIAGNOSIS — C7931 Secondary malignant neoplasm of brain: Secondary | ICD-10-CM | POA: Diagnosis present

## 2015-05-24 DIAGNOSIS — R001 Bradycardia, unspecified: Secondary | ICD-10-CM | POA: Diagnosis present

## 2015-05-24 DIAGNOSIS — E869 Volume depletion, unspecified: Secondary | ICD-10-CM | POA: Diagnosis present

## 2015-05-24 DIAGNOSIS — A4101 Sepsis due to Methicillin susceptible Staphylococcus aureus: Secondary | ICD-10-CM | POA: Diagnosis present

## 2015-05-24 DIAGNOSIS — Z66 Do not resuscitate: Secondary | ICD-10-CM | POA: Diagnosis present

## 2015-05-24 LAB — LACTIC ACID, PLASMA
LACTIC ACID, VENOUS: 1.3 mmol/L (ref 0.5–2.0)
LACTIC ACID, VENOUS: 1.7 mmol/L (ref 0.5–2.0)

## 2015-05-24 LAB — CBC
HEMATOCRIT: 37.9 % (ref 36.0–46.0)
Hemoglobin: 12.7 g/dL (ref 12.0–15.0)
MCH: 29.1 pg (ref 26.0–34.0)
MCHC: 33.5 g/dL (ref 30.0–36.0)
MCV: 86.7 fL (ref 78.0–100.0)
PLATELETS: 246 10*3/uL (ref 150–400)
RBC: 4.37 MIL/uL (ref 3.87–5.11)
RDW: 16.7 % — ABNORMAL HIGH (ref 11.5–15.5)
WBC: 19.4 10*3/uL — AB (ref 4.0–10.5)

## 2015-05-24 LAB — MRSA PCR SCREENING: MRSA by PCR: NEGATIVE

## 2015-05-24 LAB — HEPARIN LEVEL (UNFRACTIONATED): Heparin Unfractionated: <0.1 IU/mL — ABNORMAL LOW (ref 0.30–0.70)

## 2015-05-24 LAB — BRAIN NATRIURETIC PEPTIDE: B Natriuretic Peptide: 332.5 pg/mL — ABNORMAL HIGH (ref 0.0–100.0)

## 2015-05-24 LAB — TROPONIN I
TROPONIN I: 0.45 ng/mL — AB (ref <0.031)
TROPONIN I: 0.63 ng/mL — AB (ref <0.031)
Troponin I: 0.59 ng/mL (ref <0.031)

## 2015-05-24 LAB — PROCALCITONIN: Procalcitonin: 1.35 ng/mL

## 2015-05-24 MED ORDER — LORAZEPAM 0.5 MG PO TABS
0.2500 mg | ORAL_TABLET | Freq: Four times a day (QID) | ORAL | Status: DC | PRN
  Administered 2015-05-24: 0.25 mg via ORAL
  Filled 2015-05-24: qty 1

## 2015-05-24 MED ORDER — ACETAMINOPHEN ER 650 MG PO TBCR: 650.0000 mg | EXTENDED_RELEASE_TABLET | Freq: Three times a day (TID) | ORAL | Status: DC | PRN

## 2015-05-24 MED ORDER — SENNOSIDES 8.6 MG PO TABS: 1.0000 | ORAL_TABLET | Freq: Two times a day (BID) | ORAL | Status: DC

## 2015-05-24 MED ORDER — ASPIRIN EC 81 MG PO TBEC
81.0000 mg | DELAYED_RELEASE_TABLET | Freq: Every day | ORAL | Status: DC
  Administered 2015-05-24 – 2015-05-29 (×6): 81 mg via ORAL
  Filled 2015-05-24 (×6): qty 1

## 2015-05-24 MED ORDER — VANCOMYCIN HCL IN DEXTROSE 750-5 MG/150ML-% IV SOLN
750.0000 mg | INTRAVENOUS | Status: DC
  Administered 2015-05-24 – 2015-05-26 (×3): 750 mg via INTRAVENOUS
  Filled 2015-05-24 (×4): qty 150

## 2015-05-24 MED ORDER — ACETAMINOPHEN 325 MG PO TABS
650.0000 mg | ORAL_TABLET | ORAL | Status: DC | PRN
Start: 2015-05-24 — End: 2015-05-31
  Administered 2015-05-29: 650 mg via ORAL
  Filled 2015-05-24: qty 2

## 2015-05-24 MED ORDER — DILTIAZEM HCL 100 MG IV SOLR
5.0000 mg/h | INTRAVENOUS | Status: DC
  Administered 2015-05-25 (×2): 5 mg/h via INTRAVENOUS
  Filled 2015-05-24 (×2): qty 100

## 2015-05-24 MED ORDER — MORPHINE SULFATE (CONCENTRATE) 10 MG/0.5ML PO SOLN: 10.0000 mg | ORAL | Status: DC | PRN

## 2015-05-24 MED ORDER — NITROGLYCERIN 0.4 MG SL SUBL: 0.4000 mg | SUBLINGUAL_TABLET | SUBLINGUAL | Status: DC | PRN

## 2015-05-24 MED ORDER — DEXAMETHASONE 4 MG PO TABS
4.0000 mg | ORAL_TABLET | Freq: Two times a day (BID) | ORAL | Status: DC
  Administered 2015-05-24 – 2015-05-25 (×3): 4 mg via ORAL
  Filled 2015-05-24 (×4): qty 1

## 2015-05-24 MED ORDER — IOHEXOL 300 MG/ML  SOLN
100.0000 mL | Freq: Once | INTRAMUSCULAR | Status: AC | PRN
  Administered 2015-05-24: 75 mL via INTRAVENOUS

## 2015-05-24 MED ORDER — ONDANSETRON HCL 4 MG/2ML IJ SOLN
4.0000 mg | Freq: Four times a day (QID) | INTRAMUSCULAR | Status: DC | PRN
  Administered 2015-05-26: 4 mg via INTRAVENOUS
  Filled 2015-05-24: qty 2

## 2015-05-24 MED ORDER — DEXTROSE 5 % IV SOLN
2.0000 g | Freq: Once | INTRAVENOUS | Status: AC
  Administered 2015-05-24: 2 g via INTRAVENOUS
  Filled 2015-05-24: qty 2

## 2015-05-24 MED ORDER — SENNA 8.6 MG PO TABS
1.0000 | ORAL_TABLET | Freq: Two times a day (BID) | ORAL | Status: DC
  Administered 2015-05-24 – 2015-05-29 (×4): 8.6 mg via ORAL
  Filled 2015-05-24 (×9): qty 1

## 2015-05-24 MED ORDER — METOPROLOL TARTRATE 1 MG/ML IV SOLN
5.0000 mg | Freq: Once | INTRAVENOUS | Status: AC
  Administered 2015-05-24: 5 mg via INTRAVENOUS
  Filled 2015-05-24: qty 5

## 2015-05-24 MED ORDER — PROCHLORPERAZINE MALEATE 5 MG PO TABS
2.5000 mg | ORAL_TABLET | Freq: Three times a day (TID) | ORAL | Status: DC
  Administered 2015-05-24 – 2015-05-29 (×13): 2.5 mg via ORAL
  Filled 2015-05-24 (×20): qty 1

## 2015-05-24 MED ORDER — ASPIRIN EC 81 MG PO TBEC: 81.0000 mg | DELAYED_RELEASE_TABLET | Freq: Every day | ORAL | Status: DC

## 2015-05-24 MED ORDER — DEXTROSE 5 % IV SOLN
2.0000 g | INTRAVENOUS | Status: DC
  Administered 2015-05-25: 2 g via INTRAVENOUS
  Filled 2015-05-24: qty 2

## 2015-05-24 MED ORDER — IOHEXOL 300 MG/ML  SOLN
25.0000 mL | Freq: Once | INTRAMUSCULAR | Status: AC | PRN
  Administered 2015-05-24: 25 mL via ORAL

## 2015-05-24 MED ORDER — ATORVASTATIN CALCIUM 40 MG PO TABS
40.0000 mg | ORAL_TABLET | Freq: Once | ORAL | Status: AC
  Administered 2015-05-24: 40 mg via ORAL
  Filled 2015-05-24: qty 1

## 2015-05-24 MED ORDER — HEPARIN (PORCINE) IN NACL 100-0.45 UNIT/ML-% IJ SOLN
550.0000 [IU]/h | INTRAMUSCULAR | Status: DC
  Administered 2015-05-24: 550 [IU]/h via INTRAVENOUS
  Filled 2015-05-24: qty 250

## 2015-05-24 NOTE — ED Notes (Signed)
Attempted blood draw with no success.     

## 2015-05-24 NOTE — ED Notes (Signed)
I attempted to collet lab and was unsuccessful.  I called main lab and Vickie is coming down to collect it.

## 2015-05-24 NOTE — Progress Notes (Signed)
ANTICOAGULATION CONSULT NOTE - Initial Consult  Pharmacy Consult for Heparin Indication: chest pain/ACS  No Known Allergies  Patient Measurements: Height: 5\' 4"  (162.6 cm) Weight: 98 lb (44.453 kg) IBW/kg (Calculated) : 54.7 Heparin Dosing Weight:   Vital Signs: Temp: 102.8 F (39.3 C) (03/15 2155) Temp Source: Rectal (03/15 2155) BP: 143/93 mmHg (03/16 0219) Pulse Rate: 118 (03/16 0219)  Labs:  Recent Labs  05/23/15 2120 05/24/15 0037  HGB 13.3  --   HCT 39.6  --   PLT 245  --   LABPROT 14.4  --   INR 1.10  --   CREATININE 0.64  --   TROPONINI  --  0.45*    Estimated Creatinine Clearance: 33.5 mL/min (by C-G formula based on Cr of 0.64).   Medical History: Past Medical History  Diagnosis Date  . Hypertension   . Hyperlipidemia   . Arthritis   . Cancer ([place])     colon    Medications:  Infusions:  . sodium chloride 125 mL/hr at 05/24/15 0108  . ceFEPime (MAXIPIME) IV    . [START ON 05/25/2015] ceFEPime (MAXIPIME) IV    . heparin 550 Units/hr (05/24/15 0110)    Assessment: Patient report chest pain to son while at SNF, in ED with + troponin.  Heparin dosing for ACS.  No oral anticoagulants noted on med rec.   Goal of Therapy:  Heparin level 0.3-0.7 units/ml Monitor platelets by anticoagulation protocol: Yes   Plan:  Heparin bolus 2650  units iv x1 Heparin drip at 550 units/hr Daily CBC Next heparin level at 0900    Tyler Deis, Shea Stakes [place] 05/24/2015,5:00 AM

## 2015-05-24 NOTE — Telephone Encounter (Signed)
PATIENT'S DAUGHTER STEPHANIE called.  Mom is in the [place] ED waiting for a bed.  She had an episode last night at [facility] and Dr. Deforest Hoyles felt she needed to be admitted.  She's had a mild heart attack.  She will not be in tomorrow." Asked Colletta Maryland to call [facility] with update when mom is discharged to determine when to reschedule.

## 2015-05-24 NOTE — ED Notes (Signed)
Attempt x 3 to obtain blood sample for heparien level. Unsuccessful. [place] collection in route.

## 2015-05-24 NOTE — H&P (Signed)
[facility] History and Physical  [name] [id number] DOB: 1924-04-06 DOA: 05/23/2015  Referring physician: ED physician PCP: Melinda Crutch  Specialists: Dr. Benay Spice (oncology), Dr. Lisbeth Renshaw (radiation oncology)   Chief Complaint:  Altered mental status  HPI: [name] is a 82 y.o. 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 Patient is typically confused and has significant cognitive deficits at baseline, but is able to communicate her basic needs and recognizes her family. This morning, she reportedly called her son at approximately 10 AM to report crushing chest pain. He reportedly informed SNF staff of this complaint. When he went to visit her later in the afternoon, she was unable to recognize him and unable to give any information about her chest pain or other symptoms. She was noted to be febrile to 100.18F and a foul odor to the urine was also reported. EMS was activated and she was transported to the hospital for evaluation of acute confusional state and fever.  In ED, patient was found to be febrile to 39.3 C, saturating well on room air, with tachycardia in the low 100s, and vitals otherwise stable. Initial blood work is notable for a leukocytosis of 22,600 and lactic acid of 2.27. Troponin returned elevated to a value of 0.49 and EKG features a sinus tachycardia with rate 110 and nonspecific T-wave abnormalities in the anterior leads. Chest x-ray was obtained and negative for acute cardiopulmonary disease. Head CT was negative for acute intracranial abnormalities. Urinalysis features many bacteria, moderate leukocytes, positive nitrites, and moderate hemoglobin.  Blood and urine cultures were obtained. Patient was bolused with 1.5 L of normal saline. Empiric vancomycin and Zosyn were given and a rectal aspirin 300 mg placed. Heparin bolus under ACS  dosing was ordered by EDP, patient remained hemodynamically stable, and she will be admitted to the hospital for ongoing evaluation and management of NSTEMI and sepsis suspected secondary to UTI.   Where does patient live?   SNF  Can patient participate in ADLs?   Some   Review of Systems:  Unable to obtain ROS secondary to patient's clinical condition with encephalopathy.     Allergy: No Known Allergies  Past Medical History  Diagnosis Date  . Hypertension   . Hyperlipidemia   . Arthritis   . Cancer [facility])     colon    Past Surgical History  Procedure Laterality Date  . Total hip arthroplasty    . Colonoscopy with propofol N/A 07/08/2013    Procedure: COLONOSCOPY WITH PROPOFOL;  Surgeon: Wonda Horner, MD;  Location: [facility] ENDOSCOPY;  Service: Endoscopy;  Laterality: N/A;  . Partial colectomy Right 07/09/2013    Procedure: PARTIAL COLECTOMY AND EXCISION OF NEVUS FROM RIGHT ABDOMINAL WALL;  Surgeon: Earnstine Regal, MD;  Location: [facility] ORS;  Service: General;  Laterality: Right;  . Colon surgery    . Eye surgery      bilateral cataract with lens implants  . Ileostomy closure N/A 03/23/2014    Procedure: ILEOSTOMY CLOSURE;  Surgeon: Armandina Gemma, MD;  Location: [facility] ORS;  Service: General;  Laterality: N/A;  . Parastomal hernia repair N/A 03/23/2014    Procedure: HERNIA REPAIR PARASTOMAL;  Surgeon: Armandina Gemma, MD;  Location: [facility] ORS;  Service: General;  Laterality: N/A;    Social History:  reports that she has never smoked. She has never used smokeless tobacco. She reports that she does not  drink alcohol or use illicit drugs.  Family History:  Family History  Problem Relation Age of Onset  . Lung cancer Sister      Prior to Admission medications   Medication Sig Start Date End Date Taking? Authorizing Provider  acetaminophen (TYLENOL) 650 MG CR tablet Take 650 mg by mouth every 8 (eight) hours as needed for pain.   Yes Historical Provider, MD  amLODipine (NORVASC) 5 MG tablet Take 5 mg by  mouth daily.  02/01/13  Yes Historical Provider, MD  aspirin EC 81 MG tablet Take 81 mg by mouth daily.   Yes Historical Provider, MD  dexamethasone (DECADRON) 4 MG tablet Take 1 tablet (4 mg total) by mouth 2 (two) times daily at 10 am and 4 pm. 05/11/15  Yes Orson Eva, MD  Morphine Sulfate (ROXANOL PO) Take 5 mLs by mouth every 4 (four) hours as needed (pain).   Yes Historical Provider, MD  prochlorperazine (COMPAZINE) 5 MG tablet Take 1 tablet (5 mg total) by mouth every 6 (six) hours as needed for nausea or vomiting. Patient taking differently: Take 2.5 mg by mouth 3 (three) times daily.  09/22/13  Yes Owens Shark, NP  senna (SENOKOT) 8.6 MG tablet Take 1 tablet by mouth 2 (two) times daily.   Yes Historical Provider, MD  traMADol (ULTRAM) 50 MG tablet Take 1 tablet (50 mg total) by mouth every 6 (six) hours as needed (pain). 05/11/15  Yes Orson Eva, MD  fluocinonide cream (LIDEX) [id number] % Apply 1 application topically 2 (two) times daily as needed (rash).  03/05/15   Historical Provider, MD  HYDROcodone-acetaminophen (NORCO/VICODIN) 5-325 MG tablet Take 1 tablet by mouth every 6 (six) hours as needed for moderate pain. Patient not taking: Reported on 05/23/2015 05/11/15   Orson Eva, MD  LORazepam (ATIVAN) 0.5 MG tablet Take 0.5 tablets (0.25 mg total) by mouth every 6 (six) hours as needed for anxiety. 05/11/15   Orson Eva, MD  ondansetron (ZOFRAN) 8 MG tablet Take 1 tablet (8 mg total) by mouth every 8 (eight) hours as needed for nausea or vomiting. 05/01/15   Ladell Pier, MD    Physical Exam: Filed Vitals:   05/23/15 2005 05/23/15 2029 05/23/15 2155  BP: 120/70 119/78   Pulse: 108 111   Temp: 97 F (36.1 C) 97.1 F (36.2 C) 102.8 F (39.3 C)  TempSrc: Oral Oral Rectal  Resp: 16 16   Height:  5\' 4"  (1.626 m)   Weight:  44.453 kg (98 lb)   SpO2: 100% 94%    General: Not in acute distress HEENT:       Eyes: PERRL, EOMI, no scleral icterus or conjunctival pallor.       ENT: No discharge  from the ears or nose, no pharyngeal ulcers, oral mucosa dry        Neck: No JVD, no bruit, no appreciable mass Heme: No cervical adenopathy, no pallor Cardiac: Rate ~100 and regular. No appreciable murmur. No gallops or rubs. Pulm: Good air movement bilaterally. No rales, wheezing, rhonchi or rubs. Abd: Soft, nondistended, tender in lower quadrants, no rebound pain or gaurding, no mass or organomegaly, BS present. Ext: No LE edema bilaterally. 2+DP/PT pulse bilaterally. Musculoskeletal: No gross deformity, no red, hot, swollen joints   Skin: No rashes or wounds on exposed surfaces  Neuro: Somnolent, arousable, no gross facial asymmetry or asymmetric tone; PERRL, Babinski down-going bilaterally.  Psych: Patient is not overtly psychotic, though difficult to assess given clinical scenario.  Labs on Admission:  Basic Metabolic Panel:  Recent Labs Lab 05/23/15 2120  NA 135  K 4.2  CL 102  CO2 20*  GLUCOSE 123*  BUN 20  CREATININE 0.64  CALCIUM 8.9   Liver Function Tests:  Recent Labs Lab 05/23/15 2120  AST 20  ALT 20  ALKPHOS 80  BILITOT 0.9  PROT 6.0*  ALBUMIN 2.5*   No results for input(s): LIPASE, AMYLASE in the last 168 hours. No results for input(s): AMMONIA in the last 168 hours. CBC:  Recent Labs Lab 05/23/15 2120  WBC 22.6*  NEUTROABS 21.2*  HGB 13.3  HCT 39.6  MCV 86.3  PLT 245   Cardiac Enzymes: No results for input(s): CKTOTAL, CKMB, CKMBINDEX, TROPONINI in the last 168 hours.  BNP (last 3 results) No results for input(s): BNP in the last 8760 hours.  ProBNP (last 3 results) No results for input(s): PROBNP in the last 8760 hours.  CBG: No results for input(s): GLUCAP in the last 168 hours.  Radiological Exams on Admission: Dg Chest 2 View  05/23/2015  CLINICAL DATA:  Altered mental status, weakness and fever. History of colon cancer. EXAM: CHEST  2 VIEW COMPARISON:  Chest x-ray dated 07/06/2013. FINDINGS: Mild cardiomegaly is stable.  Atherosclerotic changes again noted at the aortic arch. Vague opacity at the left costophrenic angle is grossly stable, most compatible with chronic atelectasis or chronic pleural thickening. Lungs are otherwise clear. No evidence of pneumonia. Mild degenerative change again noted within the thoracic spine. Degenerative changes also noted at each shoulder. No acute- appearing osseous abnormality. IMPRESSION: No active cardiopulmonary disease. Electronically Signed   By: Franki Cabot M.D.   On: 05/23/2015 21:41   Ct Head Wo Contrast  05/23/2015  CLINICAL DATA:  Acute onset of altered level of consciousness. Fever and malodorous urine. Nausea and vomiting. Initial encounter. EXAM: CT HEAD WITHOUT CONTRAST TECHNIQUE: Contiguous axial images were obtained from the base of the skull through the vertex without intravenous contrast. COMPARISON:  MRI of the brain performed 05/10/2015 FINDINGS: A 1.9 cm right parasagittal mass is again noted at the convexity, with surrounding decreased attenuation within the white matter. No midline shift is seen. Mild periventricular white matter change likely reflects small vessel ischemic microangiopathy. There is no evidence of acute intra or extra-axial hemorrhage. There is no evidence for acute infarct. The brainstem and fourth ventricle are within normal limits. The basal ganglia are unremarkable in appearance. The cerebral hemispheres demonstrate grossly normal gray-white differentiation. No mass effect or midline shift is seen. There is no evidence of fracture; visualized osseous structures are unremarkable in appearance. The orbits are within normal limits. The paranasal sinuses and mastoid air cells are well-aerated. No significant soft tissue abnormalities are seen. IMPRESSION: 1. 1.9 cm right parasagittal mass at the convexity, with surrounding decreased attenuation within the white matter. No midline shift seen. 2. Mild small vessel ischemic microangiopathy. Electronically  Signed   By: Garald Balding M.D.   On: 05/23/2015 21:58    EKG: Independently reviewed.  Abnormal findings:  Sinus tachycardia (rate 110), non-specific T-wave change in anterior leads  Assessment/Plan  1. NSTEMI  - Reported crushing CP to her son at ~10 am on 05/23/15  - Initial trop in 0.49 and only non-specific Tw abnormality noted on EKG  - Rectal ASA 300 mg given in ED, Lopressor 5mg  IVP x1 given, will have her take a high-intensity statin tonight if can safely swallow - Heparin bolus and infusion initiated according to ACS  dosing with pharmacy consultation  - Trend troponin, repeat EKG in am   2. Sepsis secondary to UTI  - Meets sepsis criteria on admission with fever, tachycardia, leukocytosis, elevated lactate, grossly positive UA  - Blood and urine cultures incubating  - Empiric vancomycin and Zosyn administered in ED  - Continue with empiric coverage of urinary pathogens with cefepime  - No prior urine cultures in our system; no known hx of ESBL; MRSA screen has been negative historically  - Follow-up culture data and tailor abx as indicated   3. Hypertension  - At goal currently  - On amlodipine at home, but will hold for now in setting of sepsis and risk of progression to shock  - Treat with prn hydralazine 10 mg IVP overnight - Resume home Norvasc when appropriate    4. Colorectal cancer metastatic to brain  - Followed by Dr. Benay Spice and Dr. Lisbeth Renshaw  - Last radiation treatment was 2 wks ago  - Further management per oncology     DVT ppx:  Heparin infusion per ACS dosing  Code Status: DNR Family Communication: None at bed side.           Disposition Plan: Admit to inpatient   Date of Service 05/24/2015    Vianne Bulls, MD [facility] Pager [phone]  If 7PM-7AM, please contact night-coverage [email or web address] Password [facility] 05/24/2015, 12:07 AM

## 2015-05-24 NOTE — Clinical Social Work Note (Signed)
Clinical Social Work Assessment  Patient Details  Name: [name] MRN: [id number] Date of Birth: 1924-08-31  Date of referral:  05/24/15               Reason for consult:  Other (Comment Required) (Patient from [place])                Permission sought to share information with:    Permission granted to share information::     Name::        Agency::     Relationship::     Contact Information:     Housing/Transportation Living arrangements for the past 2 months:  [place] (Patient reports she resides at [facility]) Source of Information:  Patient Patient Interpreter Needed:  None Criminal Activity/Legal Involvement Pertinent to Current Situation/Hospitalization:  No - Comment as needed Significant Relationships:  Adult Children (Patient reports she has two adult children) Lives with:  Facility Resident Do you feel safe going back to the place where you live?    Need for family participation in patient care:     Care giving concerns: Unknown at this time   Facilities manager / plan: CSW spoke with patient at bedside. Patient never opened her eyes when speaking with CSW. Patient reports she is a resident at [facility] and per patient, she has been there for four months. Patient reports she [facility] to the [facility] due to weakness. Patient reports she has a legal guardian, Lailany Lasher and she states she has a son, Malaiya Foister. Patient reports she has a wheelchair and a walker. Patient reports she receives assistance at [facility] with her ADL's.  No questions noted for CSW at this time.   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) PT Recommendations:  Not assessed at this time Information / Referral to community resources:  Other (Comment Required) (None given at this time)  Patient/Family's Response to care: Unknown  at this time  Patient/Family's Understanding of and Emotional Response to Diagnosis, Current Treatment, and Prognosis: Unknown at this time  Emotional Assessment Appearance:  Appears younger than stated age Attitude/Demeanor/Rapport:    Affect (typically observed):  Other (Patient never opened her eyes when talking) Orientation:    Alcohol / Substance use:  Not Applicable Psych involvement (Current and /or in the community):  No (Comment)  Discharge Needs  Concerns to be addressed:  Other (Comment Required (Unknown) Readmission within the last 30 days:    Current discharge risk:  Other (Unknown) Barriers to Discharge:  Other (Unknown)   Guadelupe Sabin, LCSW 05/24/2015, 12:49 PM

## 2015-05-24 NOTE — Progress Notes (Signed)
Pt has steady increase in heart rate. HR 110-120 sometimes rise to 140 for brief second then decreased to 110's. Pt lying with eyes closed arouses easily. 12 lead EKG comfirmed A-fib. VSS noted in chart. MD notified. SRP, RN

## 2015-05-24 NOTE — Progress Notes (Signed)
PROGRESS NOTE  [name] [id number] DOB: 11/26/1924 DOA: 05/23/2015 PCP: Melinda Crutch Brief History 82 y.o. 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  At baseline, the patient has mild cognitive deficit, but is able to express her needs and recognize her family. However, on the morning of admission, she reportedly called her son at approximately 10 AM to report crushing chest pain. He reportedly informed SNF staff of this complaint. When he went to visit her later in the afternoon, she was unable to recognize him and unable to give any information about her chest pain or other symptoms. She was noted to be febrile to 100.44F and a foul odor to the urine was also reported. EMS was activated and she was transported to the hospital for evaluation of acute confusional state and fever.  In the emergency department, the patient was noted to have elevated troponin up to 0.63, WBC 22.6, lactic acid 2.27. The patient was started on vancomycin and cefepime and given 1.5 L bolus of normal saline. She was also started on a heparin drip due to concerns for possible NSTEMI.  The patient was recently discharged to [facility] from the hospital on 05/11/2015 after suffering hemiparesis secondary to brain metastasis from her colon cancer.  Assessment/Plan: Sepsis secondary to UTI  - Meets sepsis criteria on admission with fever, tachycardia, leukocytosis, elevated lactate, grossly positive UA  - Blood and urine cultures incubating  - Empiric vancomycin and Zosyn administered in ED  -continue vanc and cefepime pending culture data - Follow-up culture data and tailor abx as indicated  -Chest x-ray negative for infiltrates Elevated troponin/Chest pain -?NSTEMI vs demand ischemia -No chest pain presently -EKG--sinus  rhythm with nonspecific T-wave inversion -Cardiology consult -Continue heparin drip pending cardiology input Acute encephalopathy -Secondary to infectious process -At baseline, the patient is able to converse and clarify her needs Abdominal pain -CT abdomen in setting of sepsis stage 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 See problem #1.  -On need to follow-up on duodenal biopsy pathology results from [facility]. Per oncology. -Minimal CBD obstruction and duodenal partial obstruction biopsies from [facility]-->showed adenocarcinoma from EUS -Placed on Dr. Ashok Cordia consult list Left hemiparesis secondary to metastatic colon cancer -metastatic disease noted on MRI head which shows a 19 mm high right frontoparietal mass consistent with solitary metastases. Moderate vasogenic edema without shift.  -Continue po Decadron -pt had one SRS fraction on 05/11/15 -pathology report from duodenal biopsy at [facility] adenocarcinoma  Hypertension -Resume home dose Norvasc if BP climbs  -Hydralazine as needed. -discontinue losartan HCTZ-->BP remains stable -HCTZ contributing to hyponatremia hyponatremia - likely due to volume depletion from poor oral intake -IV fluids-->improved  Family Communication:   Pt at beside Disposition Plan:   SNF when medically stable      Procedures/Studies: Dg Chest 2 View  05/23/2015  CLINICAL DATA:  Altered mental status, weakness and fever. History of colon cancer. EXAM: CHEST  2 VIEW COMPARISON:  Chest x-ray dated 07/06/2013. FINDINGS: Mild cardiomegaly is stable. Atherosclerotic changes again noted at the aortic arch. Vague opacity at the left costophrenic angle is grossly stable, most compatible with chronic atelectasis or chronic pleural thickening. Lungs are otherwise clear. No evidence of pneumonia.  Mild degenerative change again noted  within the thoracic spine. Degenerative changes also noted at each shoulder. No acute- appearing osseous abnormality. IMPRESSION: No active cardiopulmonary disease. Electronically Signed   By: Franki Cabot M.D.   On: 05/23/2015 21:41   Ct Head Wo Contrast  05/23/2015  CLINICAL DATA:  Acute onset of altered level of consciousness. Fever and malodorous urine. Nausea and vomiting. Initial encounter. EXAM: CT HEAD WITHOUT CONTRAST TECHNIQUE: Contiguous axial images were obtained from the base of the skull through the vertex without intravenous contrast. COMPARISON:  MRI of the brain performed 05/10/2015 FINDINGS: A 1.9 cm right parasagittal mass is again noted at the convexity, with surrounding decreased attenuation within the white matter. No midline shift is seen. Mild periventricular white matter change likely reflects small vessel ischemic microangiopathy. There is no evidence of acute intra or extra-axial hemorrhage. There is no evidence for acute infarct. The brainstem and fourth ventricle are within normal limits. The basal ganglia are unremarkable in appearance. The cerebral hemispheres demonstrate grossly normal gray-white differentiation. No mass effect or midline shift is seen. There is no evidence of fracture; visualized osseous structures are unremarkable in appearance. The orbits are within normal limits. The paranasal sinuses and mastoid air cells are well-aerated. No significant soft tissue abnormalities are seen. IMPRESSION: 1. 1.9 cm right parasagittal mass at the convexity, with surrounding decreased attenuation within the white matter. No midline shift seen. 2. Mild small vessel ischemic microangiopathy. Electronically Signed   By: Garald Balding M.D.   On: 05/23/2015 21:58   Mr [name] [id number] Contrast  05/10/2015  CLINICAL DATA:  Colon cancer. LEFT hemiparesis, leg greater than arm. Confusion. SRS planning EXAM: MRI HEAD WITHOUT AND WITH CONTRAST TECHNIQUE: Multiplanar, multiecho pulse sequences  of the brain and surrounding structures were obtained without and with intravenous contrast. CONTRAST:  6mL MULTIHANCE GADOBENATE DIMEGLUMINE 529 MG/ML IV SOLN COMPARISON:  1.5 tesla MR 05/08/2015. FINDINGS: Redemonstrated is 14 x 18 x 18 mm (R-L x A-P x C-C) superficial, but intra-axial mass, RIGHT posterior frontal cortex, parasagittal location with surrounding vasogenic edema. There is avid postcontrast enhancement. Marked surrounding vasogenic edema with no midline shift but slight depression of the RIGHT lateral ventricle. No additional brain lesions. No osseous lesions. Extracranial soft tissues unremarkable. No significant change from previous exam. IMPRESSION: Superficial, but intra-axial, RIGHT posterior frontal parasagittal metastasis. This appears to be a solitary lesion, approximately 2 cm. Extensive vasogenic edema but no midline shift. Electronically Signed   By: Staci Righter M.D.   On: 05/10/2015 15:32   Mr [name] [id number] Contrast  05/08/2015  CLINICAL DATA:  Left hemi paresis. History of colon cancer. Metastasis versus infarct. EXAM: MRI HEAD WITHOUT AND WITH CONTRAST TECHNIQUE: Multiplanar, multiecho pulse sequences of the brain and surrounding structures were obtained without and with intravenous contrast. CONTRAST:  10 cc MultiHance intravenous COMPARISON:  None. FINDINGS: Calvarium and upper cervical spine: No definitive skeletal metastasis. Orbits: Bilateral cataract resection.  Negative for mass Sinuses and Mastoids: Clear. Brain: There is an enhancing intra-axial mass in the parasagittal right frontal parietal lobe along the upper motor strip measuring 19 mm. Moderate vasogenic edema without shift or herniation. No other masses identified. MR signal shows dense cellularity. No definitive hemorrhagic component. No acute infarct, hemorrhage, hydrocephalus, or major vessel occlusion. Metastasis is unexpected finding, but to accelerate care these results will be called to the ordering clinician  or representative by the Radiologist Assistant, and communication documented in the PACS or zVision Dashboard. IMPRESSION: 19  mm high right frontoparietal mass consistent with solitary metastasis. Moderate vasogenic edema without shift. Electronically Signed   By: Monte Fantasia M.D.   On: 05/08/2015 11:40        Subjective: Patient is pleasantly confused. However she denies any headache, neck pain, fevers, chills, chest pain, shortness breath, nausea, vomiting, diarrhea, dysuria, hematuria. Her medication or melena.  Objective: Filed Vitals:   05/24/15 0700 05/24/15 0715 05/24/15 0800 05/24/15 0845  BP: 126/88  115/73   Pulse: 91 88  88  Temp:      TempSrc:      Resp:   19 21  Height:      Weight:      SpO2: 100% 98%  93%   No intake or output data in the 24 hours ending 05/24/15 1035 Weight change:  Exam:   General:  Pt is alert, follows commands appropriately, not in acute distress  HEENT: No icterus, No thrush, No neck mass, [place]/AT  Cardiovascular: RRR, S1/S2, no rubs, no gallops  Respiratory: CTA bilaterally, no wheezing, no crackles, no rhonchi  Abdomen: Soft/+BS, RUQ and RLQ painnon distended, no guarding  Extremities: No edema, No lymphangitis, No petechiae, No rashes, no synovitis  Data Reviewed: Basic Metabolic Panel:  Recent Labs Lab 05/23/15 2120  NA 135  K 4.2  CL 102  CO2 20*  GLUCOSE 123*  BUN 20  CREATININE 0.64  CALCIUM 8.9   Liver Function Tests:  Recent Labs Lab 05/23/15 2120  AST 20  ALT 20  ALKPHOS 80  BILITOT 0.9  PROT 6.0*  ALBUMIN 2.5*   No results for input(s): LIPASE, AMYLASE in the last 168 hours. No results for input(s): AMMONIA in the last 168 hours. CBC:  Recent Labs Lab 05/23/15 2120 05/24/15 0607  WBC 22.6* 19.4*  NEUTROABS 21.2*  --   HGB 13.3 12.7  HCT 39.6 37.9  MCV 86.3 86.7  PLT 245 246   Cardiac Enzymes:  Recent Labs Lab 05/24/15 0037 05/24/15 0607  TROPONINI 0.45* 0.63*   BNP: Invalid  input(s): POCBNP CBG: No results for input(s): GLUCAP in the last 168 hours.  No results found for this or any previous visit (from the past 240 hour(s)).   Scheduled Meds: . aspirin EC  81 mg Oral Daily  . atorvastatin  40 mg Oral Once  . dexamethasone  4 mg Oral BID  . prochlorperazine  2.5 mg Oral TID  . senna  1 tablet Oral BID   Continuous Infusions: . sodium chloride 125 mL/hr at 05/24/15 0108  . [START ON 05/25/2015] ceFEPime (MAXIPIME) IV    . heparin 550 Units/hr (05/24/15 0110)     Aspen Deterding, DO  [facility] Pager [phone]  If 7PM-7AM, please contact night-coverage [email or web address] Password [email or web address] 05/24/2015, 10:35 AM   LOS: 0 days

## 2015-05-24 NOTE — Progress Notes (Signed)
Pharmacy Antibiotic Note  [name] is a 82 y.o. female admitted on 05/23/2015 with sepsis and UTI.  Pharmacy has been consulted for Cefepime dosing.  Plan: Cefepime 2gm iv q24hr  Height: 5\' 4"  (162.6 cm) Weight: 98 lb (44.453 kg) IBW/kg (Calculated) : 54.7  Temp (24hrs), Avg:99 F (37.2 C), Min:97 F (36.1 C), Max:102.8 F (39.3 C)   Recent Labs Lab 05/23/15 2120 05/23/15 2131 05/24/15 0037  WBC 22.6*  --   --   CREATININE 0.64  --   --   LATICACIDVEN  --  2.27* 1.3    Estimated Creatinine Clearance: 33.5 mL/min (by C-G formula based on Cr of 0.64).    No Known Allergies  Antimicrobials this admission: 3/15 Vancomycin >> 3/16 3/15 zosyn >> 3/16 3/16 cefepime >>   Thank you for allowing pharmacy to be a part of this patient's care.  Nani Skillern Crowford 05/24/2015 4:59 AM

## 2015-05-24 NOTE — Telephone Encounter (Signed)
Call placed to [facility] to let her know that Dr. Benay Spice has been notified of pt.'s hospitalization.  Pt.'s daughter appreciates call back.

## 2015-05-24 NOTE — Progress Notes (Signed)
Pharmacy Antibiotic Note  [name] is a 82 y.o. female admitted on 05/23/2015 with sepsis.  Pharmacy has been consulted for Cefepime and Vancomycin dosing.  Plan:  Continue Cefepime 2g IV q24h  Vancomycin 750mg  IV q24h.  Measure Vanc trough at steady state.  Goal 15-20.  Follow up renal fxn, culture results, and clinical course.   Height: 5\' 4"  (162.6 cm) Weight: 98 lb (44.453 kg) IBW/kg (Calculated) : 54.7  Temp (24hrs), Avg:99 F (37.2 C), Min:97 F (36.1 C), Max:102.8 F (39.3 C)   Recent Labs Lab 05/23/15 2120 05/23/15 2131 05/24/15 0037 05/24/15 0403 05/24/15 0607  WBC 22.6*  --   --   --  19.4*  CREATININE 0.64  --   --   --   --   LATICACIDVEN  --  2.27* 1.3 1.7  --     Estimated Creatinine Clearance: 33.5 mL/min (by C-G formula based on Cr of 0.64).    No Known Allergies  Antimicrobials this admission: 3/15 Zosyn >> 3/16 3/15 Vancomycin >>  3/16 Cefepime >>  Dose adjustments this admission:  Microbiology results: 3/15 BCx: 1/2 with GPC, 1/2 pending 3/15 UCx: sent  Thank you for allowing pharmacy to be a part of this patient's care.  Gretta Arab PharmD, BCPS Pager [phone] 05/24/2015 2:16 PM

## 2015-05-24 NOTE — Consult Note (Signed)
Cardiologist:  New Reason for Consult:  NSTEMI Referring Physician: Tat  [name] is an 82 y.o. female.  HPI:  The patient is an 82 year old female with a history of colon cancer with metastasis to the brain, hypertension, hyperlipidemia, left hemiplegia.  The patient presented from skilled nursing facility([facility]) with altered mental status and fever.  I spoke to her son on the phone who said she called him yesterday morning to say she was having severe CP and was thus taken to the ER.  She's been having frequent nausea which is likely from the radiation therapy and brain mass.  [name] currently denies any issues and does not remember having CP.    EKG shows sinus tachycardia with nonspecific T-wave changes V 2 and 3.  Initial POC troponin was 0.49 at 2128 hrs. Yesterday  The second, regular troponin was 0.45 and 6 hours later 0.63.  Past Medical History  Diagnosis Date  . Hypertension   . Hyperlipidemia   . Arthritis   . Cancer [facility])     colon    Past Surgical History  Procedure Laterality Date  . Total hip arthroplasty    . Colonoscopy with propofol N/A 07/08/2013    Procedure: COLONOSCOPY WITH PROPOFOL;  Surgeon: Wonda Horner, MD;  Location: [facility] ENDOSCOPY;  Service: Endoscopy;  Laterality: N/A;  . Partial colectomy Right 07/09/2013    Procedure: PARTIAL COLECTOMY AND EXCISION OF NEVUS FROM RIGHT ABDOMINAL WALL;  Surgeon: Earnstine Regal, MD;  Location: [facility] ORS;  Service: General;  Laterality: Right;  . Colon surgery    . Eye surgery      bilateral cataract with lens implants  . Ileostomy closure N/A 03/23/2014    Procedure: ILEOSTOMY CLOSURE;  Surgeon: Armandina Gemma, MD;  Location: [facility]S;  Service: General;  Laterality: N/A;  . Parastomal hernia repair N/A 03/23/2014    Procedure: HERNIA REPAIR PARASTOMAL;  Surgeon: Armandina Gemma, MD;  Location: [facility] ORS;  Service: General;  Laterality: N/A;    Family History  Problem Relation Age of Onset  . Lung cancer Sister      Social History:  reports that she has never smoked. She has never used smokeless tobacco. She reports that she does not drink alcohol or use illicit drugs.  Allergies: No Known Allergies  Medications:  Medication Sig  acetaminophen (TYLENOL) 650 MG CR tablet Take 650 mg by mouth every 8 (eight) hours as needed for pain.  amLODipine (NORVASC) 5 MG tablet Take 5 mg by mouth daily.   aspirin EC 81 MG tablet Take 81 mg by mouth daily.  dexamethasone (DECADRON) 4 MG tablet Take 1 tablet (4 mg total) by mouth 2 (two) times daily at 10 am and 4 pm.  Morphine Sulfate (ROXANOL PO) Take 5 mLs by mouth every 4 (four) hours as needed (pain).  prochlorperazine (COMPAZINE) 5 MG tablet Take 1 tablet (5 mg total) by mouth every 6 (six) hours as needed for nausea or vomiting. Patient taking differently: Take 2.5 mg by mouth 3 (three) times daily.   senna (SENOKOT) 8.6 MG tablet Take 1 tablet by mouth 2 (two) times daily.  traMADol (ULTRAM) 50 MG tablet Take 1 tablet (50 mg total) by mouth every 6 (six) hours as needed (pain).  fluocinonide cream (LIDEX) 1.61 % Apply 1 application topically 2 (two) times daily as needed (rash).   HYDROcodone-acetaminophen (NORCO/VICODIN) 5-325 MG tablet Take 1 tablet by mouth every 6 (six) hours as needed for moderate pain. Patient not  taking: Reported on 05/23/2015  LORazepam (ATIVAN) 0.5 MG tablet Take 0.5 tablets (0.25 mg total) by mouth every 6 (six) hours as needed for anxiety.  ondansetron (ZOFRAN) 8 MG tablet Take 1 tablet (8 mg total) by mouth every 8 (eight) hours as needed for nausea or vomiting.     Results for orders placed or performed during the hospital encounter of 05/23/15 (from the past 48 hour(s))  CBC WITH DIFFERENTIAL     Status: Abnormal   Collection Time: 05/23/15  9:20 PM  Result Value Ref Range   WBC 22.6 (H) 4.0 - 10.5 K/uL    Comment: WHITE COUNT CONFIRMED ON SMEAR   RBC 4.59 3.87 - 5.11 MIL/uL   Hemoglobin 13.3 12.0 - 15.0 g/dL   HCT  39.6 36.0 - 46.0 %   MCV 86.3 78.0 - 100.0 fL   MCH 29.0 26.0 - 34.0 pg   MCHC 33.6 30.0 - 36.0 g/dL   RDW 16.6 (H) 11.5 - 15.5 %   Platelets 245 150 - 400 K/uL    Comment: REPEATED TO VERIFY SPECIMEN CHECKED FOR CLOTS PLATELET COUNT CONFIRMED BY SMEAR    Neutrophils Relative % 94 %   Neutro Abs 21.2 (H) 1.7 - 7.7 K/uL   Lymphocytes Relative 3 %   Lymphs Abs 0.6 (L) 0.7 - 4.0 K/uL   Monocytes Relative 4 %   Monocytes Absolute 0.9 0.1 - 1.0 K/uL   Eosinophils Relative 0 %   Eosinophils Absolute 0.0 0.0 - 0.7 K/uL   Basophils Relative 0 %   Basophils Absolute 0.0 0.0 - 0.1 K/uL   WBC Morphology DOHLE BODIES   Comprehensive metabolic panel     Status: Abnormal   Collection Time: 05/23/15  9:20 PM  Result Value Ref Range   Sodium 135 135 - 145 mmol/L   Potassium 4.2 3.5 - 5.1 mmol/L   Chloride 102 101 - 111 mmol/L   CO2 20 (L) 22 - 32 mmol/L   Glucose, Bld 123 (H) 65 - 99 mg/dL   BUN 20 6 - 20 mg/dL   Creatinine, Ser 0.64 0.44 - 1.00 mg/dL   Calcium 8.9 8.9 - 10.3 mg/dL   Total Protein 6.0 (L) 6.5 - 8.1 g/dL   Albumin 2.5 (L) 3.5 - 5.0 g/dL   AST 20 15 - 41 U/L   ALT 20 14 - 54 U/L   Alkaline Phosphatase 80 38 - 126 U/L   Total Bilirubin 0.9 0.3 - 1.2 mg/dL   GFR calc non Af Amer >60 >60 mL/min   GFR calc Af Amer >60 >60 mL/min    Comment: (NOTE) The eGFR has been calculated using the CKD EPI equation. This calculation has not been validated in all clinical situations. eGFR's persistently <60 mL/min signify possible Chronic Kidney Disease.    Anion gap 13 5 - 15  Protime-INR     Status: None   Collection Time: 05/23/15  9:20 PM  Result Value Ref Range   Prothrombin Time 14.4 11.6 - 15.2 seconds   INR 1.10 0.00 - 1.49  I-stat troponin, ED     Status: Abnormal   Collection Time: 05/23/15  9:28 PM  Result Value Ref Range   Troponin i, poc 0.49 (HH) 0.00 - 0.08 ng/mL   Comment NOTIFIED PHYSICIAN    Comment 3            Comment: Due to the release kinetics of cTnI, a  negative result within the first hours of the onset of symptoms does  not rule out myocardial infarction with certainty. If myocardial infarction is still suspected, repeat the test at appropriate intervals.   I-Stat CG4 Lactic Acid, ED  (not at [facility])     Status: Abnormal   Collection Time: 05/23/15  9:31 PM  Result Value Ref Range   Lactic Acid, Venous 2.27 (HH) 0.5 - 2.0 mmol/L   Comment NOTIFIED PHYSICIAN   Urinalysis, Routine w reflex microscopic (not at [facility])     Status: Abnormal   Collection Time: 05/23/15  9:55 PM  Result Value Ref Range   Color, Urine AMBER (A) YELLOW    Comment: BIOCHEMICALS MAY BE AFFECTED BY COLOR   APPearance CLOUDY (A) CLEAR   Specific Gravity, Urine 1.028 1.005 - 1.030   pH 6.5 5.0 - 8.0   Glucose, UA NEGATIVE NEGATIVE mg/dL   Hgb urine dipstick MODERATE (A) NEGATIVE   Bilirubin Urine NEGATIVE NEGATIVE   Ketones, ur NEGATIVE NEGATIVE mg/dL   Protein, ur 100 (A) NEGATIVE mg/dL   Nitrite POSITIVE (A) NEGATIVE   Leukocytes, UA MODERATE (A) NEGATIVE  Urine microscopic-add on     Status: Abnormal   Collection Time: 05/23/15  9:55 PM  Result Value Ref Range   Squamous Epithelial / LPF 0-5 (A) NONE SEEN   WBC, UA 6-30 0 - 5 WBC/hpf   RBC / HPF 6-30 0 - 5 RBC/hpf   Bacteria, UA MANY (A) NONE SEEN   Casts HYALINE CASTS (A) NEGATIVE  Lactic acid, plasma     Status: None   Collection Time: 05/24/15 12:37 AM  Result Value Ref Range   Lactic Acid, Venous 1.3 0.5 - 2.0 mmol/L  Procalcitonin     Status: None   Collection Time: 05/24/15 12:37 AM  Result Value Ref Range   Procalcitonin 1.35 ng/mL    Comment:        Interpretation: PCT > 0.5 ng/mL and <= 2 ng/mL: Systemic infection (sepsis) is possible, but other conditions are known to elevate PCT as well. (NOTE)         ICU PCT Algorithm               Non ICU PCT Algorithm    ----------------------------     ------------------------------         PCT < 0.25 ng/mL                 PCT < 0.1 ng/mL      Stopping of antibiotics            Stopping of antibiotics       strongly encouraged.               strongly encouraged.    ----------------------------     ------------------------------       PCT level decrease by               PCT < 0.25 ng/mL       >= 80% from peak PCT       OR PCT 0.25 - 0.5 ng/mL          Stopping of antibiotics                                             encouraged.     Stopping of antibiotics           encouraged.    ----------------------------     ------------------------------  PCT level decrease by              PCT >= 0.25 ng/mL       < 80% from peak PCT        AND PCT >= 0.5 ng/mL             Continuing antibiotics                                              encouraged.       Continuing antibiotics            encouraged.    ----------------------------     ------------------------------     PCT level increase compared          PCT > 0.5 ng/mL         with peak PCT AND          PCT >= 0.5 ng/mL             Escalation of antibiotics                                          strongly encouraged.      Escalation of antibiotics        strongly encouraged.   Troponin I     Status: Abnormal   Collection Time: 05/24/15 12:37 AM  Result Value Ref Range   Troponin I 0.45 (H) <0.031 ng/mL    Comment:        PERSISTENTLY INCREASED TROPONIN VALUES IN THE RANGE OF 0.04-0.49 ng/mL CAN BE SEEN IN:       -UNSTABLE ANGINA       -CONGESTIVE HEART FAILURE       -MYOCARDITIS       -CHEST TRAUMA       -ARRYHTHMIAS       -LATE PRESENTING MYOCARDIAL INFARCTION       -COPD   CLINICAL FOLLOW-UP RECOMMENDED.   Brain natriuretic peptide     Status: Abnormal   Collection Time: 05/24/15 12:37 AM  Result Value Ref Range   B Natriuretic Peptide 332.5 (H) 0.0 - 100.0 pg/mL  Lactic acid, plasma     Status: None   Collection Time: 05/24/15  4:03 AM  Result Value Ref Range   Lactic Acid, Venous 1.7 0.5 - 2.0 mmol/L  Troponin I     Status: Abnormal   Collection Time:  05/24/15  6:07 AM  Result Value Ref Range   Troponin I 0.63 (HH) <0.031 ng/mL    Comment:        POSSIBLE MYOCARDIAL ISCHEMIA. SERIAL TESTING RECOMMENDED. CRITICAL RESULT CALLED TO, READ BACK BY AND VERIFIED WITH: Natasha Bence, RN [phone] 05/24/15 BY A NORMAN   CBC     Status: Abnormal   Collection Time: 05/24/15  6:07 AM  Result Value Ref Range   WBC 19.4 (H) 4.0 - 10.5 K/uL   RBC 4.37 3.87 - 5.11 MIL/uL   Hemoglobin 12.7 12.0 - 15.0 g/dL   HCT 37.9 36.0 - 46.0 %   MCV 86.7 78.0 - 100.0 fL   MCH 29.1 26.0 - 34.0 pg   MCHC 33.5 30.0 - 36.0 g/dL   RDW 16.7 (H) 11.5 - 15.5 %   Platelets 246 150 - 400 K/uL  Dg Chest 2 View  05/23/2015  CLINICAL DATA:  Altered mental status, weakness and fever. History of colon cancer. EXAM: CHEST  2 VIEW COMPARISON:  Chest x-ray dated 07/06/2013. FINDINGS: Mild cardiomegaly is stable. Atherosclerotic changes again noted at the aortic arch. Vague opacity at the left costophrenic angle is grossly stable, most compatible with chronic atelectasis or chronic pleural thickening. Lungs are otherwise clear. No evidence of pneumonia. Mild degenerative change again noted within the thoracic spine. Degenerative changes also noted at each shoulder. No acute- appearing osseous abnormality. IMPRESSION: No active cardiopulmonary disease. Electronically Signed   By: Franki Cabot M.D.   On: 05/23/2015 21:41   Ct Head Wo Contrast  05/23/2015  CLINICAL DATA:  Acute onset of altered level of consciousness. Fever and malodorous urine. Nausea and vomiting. Initial encounter. EXAM: CT HEAD WITHOUT CONTRAST TECHNIQUE: Contiguous axial images were obtained from the base of the skull through the vertex without intravenous contrast. COMPARISON:  MRI of the brain performed 05/10/2015 FINDINGS: A 1.9 cm right parasagittal mass is again noted at the convexity, with surrounding decreased attenuation within the white matter. No midline shift is seen. Mild periventricular white matter change  likely reflects small vessel ischemic microangiopathy. There is no evidence of acute intra or extra-axial hemorrhage. There is no evidence for acute infarct. The brainstem and fourth ventricle are within normal limits. The basal ganglia are unremarkable in appearance. The cerebral hemispheres demonstrate grossly normal gray-white differentiation. No mass effect or midline shift is seen. There is no evidence of fracture; visualized osseous structures are unremarkable in appearance. The orbits are within normal limits. The paranasal sinuses and mastoid air cells are well-aerated. No significant soft tissue abnormalities are seen. IMPRESSION: 1. 1.9 cm right parasagittal mass at the convexity, with surrounding decreased attenuation within the white matter. No midline shift seen. 2. Mild small vessel ischemic microangiopathy. Electronically Signed   By: Garald Balding M.D.   On: 05/23/2015 21:58    Review of Systems  Constitutional: Positive for fever.  All other systems reviewed and are negative. The patient currently denies nausea, vomiting, fever, chest pain, shortness of breath, orthopnea, dizziness, PND, cough, congestion, abdominal pain, hematochezia, melena, lower extremity edema, claudication.   Blood pressure 135/80, pulse 95, temperature 102.8 F (39.3 C), temperature source Rectal, resp. rate 18, height '5\' 4"'$  (1.626 m), weight 98 lb (44.453 kg), SpO2 98 %. Physical Exam  Vitals reviewed. Constitutional: She is oriented to person, place, and time. She appears well-developed and well-nourished. No distress.  HENT:  Head: Normocephalic and atraumatic.  Mouth/Throat: No oropharyngeal exudate.  Eyes: EOM are normal. Pupils are equal, round, and reactive to light. No scleral icterus.  Neck: Normal range of motion. Neck supple. No JVD present.  Cardiovascular: Normal rate and regular rhythm.   Respiratory: Effort normal and breath sounds normal. She has no wheezes. She has no rales.  GI: Soft.  Bowel sounds are normal. She exhibits no distension. There is no tenderness.  Musculoskeletal: She exhibits no edema.  Lymphadenopathy:    She has no cervical adenopathy.  Neurological: She is alert and oriented to person, place, and time.  Skin: Skin is warm and dry.  Psychiatric: She has a normal mood and affect.    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  The patient presented from skilled nursing facility([facility]) with altered mental status, fever and CP.  I think a conservative approach is warranted at this time.  Her troponin has slowly increased to 0.63 in the setting of UTI and sepsis.  Could be demand ischemia.   I think we should check an echocardiogram so we can better assess her heart function and guide medical therapy(ASA, BB, Statin, long acting nitrates) as BP will tolerate.  Change: DC heparin. I spoke her her son, who will in-turn speak to his sister regarding this plan.  I also told him I would discuss with Dr. Acie Fredrickson.  Tachycardia is reactive.    Tarri Fuller, [place] 05/24/2015, 11:25 AM    Attending Note:   The patient was seen and examined.  Agree with assessment and plan as noted above.  Changes made to the above note as needed.  1 + Troponin - the levels are very low .  Her ECG has NS changes I suspect this is demand ischemia in the setting of sepsis and some likely underlying CAD in this 82 yo paitent.  I have stopped the heparin - there is no suggestion that this is an acute plaque rupture  And in fact, heparin may increase the likelihood of intracranial  bleeding .  Agree with plans for echo Conservative therapy would be best  She is not a candidate for invasise procedures. Would not suggest a myoview  2. Metastatic colon cancer head mets  3. Urosepsis    Thayer Headings, Brooke Bonito., MD, [facility] 05/24/2015, 1:16 PM [address],  [place] Pager [phone]

## 2015-05-25 ENCOUNTER — Inpatient Hospital Stay (HOSPITAL_COMMUNITY): Payer: Medicare Other

## 2015-05-25 ENCOUNTER — Other Ambulatory Visit: Payer: Self-pay | Admitting: Nurse Practitioner

## 2015-05-25 ENCOUNTER — Telehealth: Payer: Self-pay | Admitting: Nurse Practitioner

## 2015-05-25 ENCOUNTER — Ambulatory Visit: Payer: Medicare Other | Admitting: Nurse Practitioner

## 2015-05-25 DIAGNOSIS — C189 Malignant neoplasm of colon, unspecified: Secondary | ICD-10-CM

## 2015-05-25 DIAGNOSIS — N390 Urinary tract infection, site not specified: Secondary | ICD-10-CM

## 2015-05-25 DIAGNOSIS — A419 Sepsis, unspecified organism: Secondary | ICD-10-CM

## 2015-05-25 DIAGNOSIS — C7801 Secondary malignant neoplasm of right lung: Secondary | ICD-10-CM

## 2015-05-25 DIAGNOSIS — G8194 Hemiplegia, unspecified affecting left nondominant side: Secondary | ICD-10-CM

## 2015-05-25 DIAGNOSIS — C182 Malignant neoplasm of ascending colon: Secondary | ICD-10-CM

## 2015-05-25 DIAGNOSIS — R918 Other nonspecific abnormal finding of lung field: Secondary | ICD-10-CM

## 2015-05-25 DIAGNOSIS — R791 Abnormal coagulation profile: Secondary | ICD-10-CM

## 2015-05-25 DIAGNOSIS — R7989 Other specified abnormal findings of blood chemistry: Secondary | ICD-10-CM | POA: Insufficient documentation

## 2015-05-25 DIAGNOSIS — R630 Anorexia: Secondary | ICD-10-CM

## 2015-05-25 DIAGNOSIS — I213 ST elevation (STEMI) myocardial infarction of unspecified site: Secondary | ICD-10-CM

## 2015-05-25 DIAGNOSIS — C7802 Secondary malignant neoplasm of left lung: Secondary | ICD-10-CM

## 2015-05-25 DIAGNOSIS — R778 Other specified abnormalities of plasma proteins: Secondary | ICD-10-CM

## 2015-05-25 DIAGNOSIS — C7931 Secondary malignant neoplasm of brain: Secondary | ICD-10-CM

## 2015-05-25 DIAGNOSIS — I10 Essential (primary) hypertension: Secondary | ICD-10-CM

## 2015-05-25 DIAGNOSIS — R7881 Bacteremia: Secondary | ICD-10-CM

## 2015-05-25 LAB — COMPREHENSIVE METABOLIC PANEL
ALK PHOS: 68 U/L (ref 38–126)
ALT: 20 U/L (ref 14–54)
ANION GAP: 8 (ref 5–15)
AST: 19 U/L (ref 15–41)
Albumin: 1.8 g/dL — ABNORMAL LOW (ref 3.5–5.0)
BUN: 20 mg/dL (ref 6–20)
CALCIUM: 8 mg/dL — AB (ref 8.9–10.3)
CO2: 19 mmol/L — AB (ref 22–32)
Chloride: 113 mmol/L — ABNORMAL HIGH (ref 101–111)
Creatinine, Ser: 0.51 mg/dL (ref 0.44–1.00)
Glucose, Bld: 123 mg/dL — ABNORMAL HIGH (ref 65–99)
Potassium: 3.5 mmol/L (ref 3.5–5.1)
Sodium: 140 mmol/L (ref 135–145)
Total Bilirubin: 0.3 mg/dL (ref 0.3–1.2)
Total Protein: 4.9 g/dL — ABNORMAL LOW (ref 6.5–8.1)

## 2015-05-25 LAB — ECHOCARDIOGRAM COMPLETE
HEIGHTINCHES: 64 in
Weight: 1910.4 oz

## 2015-05-25 LAB — CBC
HCT: 30.2 % — ABNORMAL LOW (ref 36.0–46.0)
HEMOGLOBIN: 10 g/dL — AB (ref 12.0–15.0)
MCH: 29.1 pg (ref 26.0–34.0)
MCHC: 33.1 g/dL (ref 30.0–36.0)
MCV: 87.8 fL (ref 78.0–100.0)
Platelets: 229 10*3/uL (ref 150–400)
RBC: 3.44 MIL/uL — AB (ref 3.87–5.11)
RDW: 17.3 % — ABNORMAL HIGH (ref 11.5–15.5)
WBC: 14.7 10*3/uL — ABNORMAL HIGH (ref 4.0–10.5)

## 2015-05-25 LAB — D-DIMER, QUANTITATIVE (NOT AT ARMC): D DIMER QUANT: 2.32 ug{FEU}/mL — AB (ref 0.00–0.50)

## 2015-05-25 MED ORDER — DEXAMETHASONE 4 MG PO TABS
4.0000 mg | ORAL_TABLET | Freq: Every day | ORAL | Status: DC
  Administered 2015-05-26 – 2015-05-29 (×4): 4 mg via ORAL
  Filled 2015-05-25 (×6): qty 1

## 2015-05-25 MED ORDER — POTASSIUM CHLORIDE 2 MEQ/ML IV SOLN
INTRAVENOUS | Status: DC
  Administered 2015-05-25: 22:00:00 via INTRAVENOUS
  Filled 2015-05-25 (×2): qty 1000

## 2015-05-25 MED ORDER — IOHEXOL 350 MG/ML SOLN
100.0000 mL | Freq: Once | INTRAVENOUS | Status: AC | PRN
  Administered 2015-05-25: 100 mL via INTRAVENOUS

## 2015-05-25 MED ORDER — METOPROLOL TARTRATE 25 MG PO TABS
12.5000 mg | ORAL_TABLET | Freq: Two times a day (BID) | ORAL | Status: DC
  Administered 2015-05-25: 12.5 mg via ORAL
  Filled 2015-05-25: qty 1

## 2015-05-25 MED ORDER — ENSURE ENLIVE PO LIQD
237.0000 mL | Freq: Three times a day (TID) | ORAL | Status: DC
  Administered 2015-05-25 – 2015-05-30 (×2): 237 mL via ORAL

## 2015-05-25 MED ORDER — POTASSIUM CHLORIDE 10 MEQ/100ML IV SOLN
10.0000 meq | INTRAVENOUS | Status: AC
  Administered 2015-05-25 – 2015-05-26 (×2): 10 meq via INTRAVENOUS
  Filled 2015-05-25 (×2): qty 100

## 2015-05-25 MED ORDER — SODIUM CHLORIDE 0.9 % IV BOLUS (SEPSIS)
500.0000 mL | Freq: Once | INTRAVENOUS | Status: AC
  Administered 2015-05-25: 500 mL via INTRAVENOUS

## 2015-05-25 NOTE — Progress Notes (Signed)
PROGRESS NOTE  Subjective:   82 year old female with a history of colon cancer with metastasis to the brain, hypertension, hyperlipidemia, left hemiplegia. The patient presented from skilled nursing facility([facility]) with altered mental status and fever  Was found to have elevated Troponin levels  peaked at 0.63.   Has no CP or dyspnea   Objective:    Vital Signs:   Temp:  [97.6 F (36.4 C)-98.1 F (36.7 C)] 97.9 F (36.6 C) (03/17 0550) Pulse Rate:  [90-125] 103 (03/17 0804) Resp:  [16-24] 20 (03/17 0550) BP: (101-163)/(41-89) 109/73 mmHg (03/17 0804) SpO2:  [95 %-99 %] 99 % (03/17 0804) Weight:  [116 lb 8 oz (52.844 kg)-119 lb 6.4 oz (54.159 kg)] 119 lb 6.4 oz (54.159 kg) (03/17 0550)      24-hour weight change: Weight change: 18 lb 8 oz (8.392 kg)  Weight trends: Filed Weights   05/23/15 2029 05/24/15 1745 05/25/15 0550  Weight: 98 lb (44.453 kg) 116 lb 8 oz (52.844 kg) 119 lb 6.4 oz (54.159 kg)    Intake/Output:        Physical Exam: BP 109/73 mmHg  Pulse 103  Temp(Src) 97.9 F (36.6 C) (Oral)  Resp 20  Ht 5\' 4"  (1.626 m)  Wt 119 lb 6.4 oz (54.159 kg)  BMI 20.48 kg/m2  SpO2 99%  Wt Readings from Last 3 Encounters:  05/25/15 119 lb 6.4 oz (54.159 kg)  05/07/15 121 lb 11.1 oz (55.2 kg)  04/19/15 130 lb 3.2 oz (59.058 kg)    General: Vital signs reviewed and noted. Very weak.  Had to help her sit up   Head: Normocephalic, atraumatic.  Eyes: conjunctivae/corneas clear.  EOM's intact.   Throat: normal  Neck:  normal   Lungs:    clear   Heart:  Irreg. Irreg.   Abdomen:  Soft, non-tender, non-distended    Extremities: No edema    Neurologic: A&O X3, CN II - XII are grossly intact. Extremely weak.   Psych: Normal     Labs: BMET:  Recent Labs  05/23/15 2120 05/25/15 0427  NA 135 140  K 4.2 3.5  CL 102 113*  CO2 20* 19*  GLUCOSE 123* 123*  BUN 20 20  CREATININE 0.64 0.51  CALCIUM 8.9 8.0*    Liver function  tests:  Recent Labs  05/23/15 2120 05/25/15 0427  AST 20 19  ALT 20 20  ALKPHOS 80 68  BILITOT 0.9 0.3  PROT 6.0* 4.9*  ALBUMIN 2.5* 1.8*   No results for input(s): LIPASE, AMYLASE in the last 72 hours.  CBC:  Recent Labs  05/23/15 2120 05/24/15 0607 05/25/15 0427  WBC 22.6* 19.4* 14.7*  NEUTROABS 21.2*  --   --   HGB 13.3 12.7 10.0*  HCT 39.6 37.9 30.2*  MCV 86.3 86.7 87.8  PLT 245 246 229    Cardiac Enzymes:  Recent Labs  05/24/15 0037 05/24/15 0607 05/24/15 1405  TROPONINI 0.45* 0.63* 0.59*    Coagulation Studies:  Recent Labs  05/23/15 2120  LABPROT 14.4  INR 1.10    Other: Invalid input(s): POCBNP No results for input(s): DDIMER in the last 72 hours. No results for input(s): HGBA1C in the last 72 hours. No results for input(s): CHOL, HDL, LDLCALC, TRIG, CHOLHDL in the last 72 hours. No results for input(s): TSH, T4TOTAL, T3FREE, THYROIDAB in the last 72 hours.  Invalid input(s): FREET3 No results for input(s): VITAMINB12, FOLATE, FERRITIN, TIBC, IRON, RETICCTPCT in the last 72 hours.  Other results:  EKG  ( personally reviewed )  -Atrial fib with rate of 103.     Medications:    Infusions: . sodium chloride 125 mL/hr at 05/25/15 0348  . diltiazem (CARDIZEM) infusion 5 mg/hr (05/25/15 0020)    Scheduled Medications: . aspirin EC  81 mg Oral Daily  . ceFEPime (MAXIPIME) IV  2 g Intravenous Q24H  . dexamethasone  4 mg Oral BID  . prochlorperazine  2.5 mg Oral TID  . senna  1 tablet Oral BID  . 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 + troponins:   Likely due to the stress of her sepsis.   Would not pursue  any further evaluation at this point.   Has colon cancer with mets to the brain  2. Atrial fib:  Also likely caused by her recent sepsis.  Was in NSR on admission  and would start low dose metoprolol 12. 5 BID to help with HR. Could also try   low dose Digoxin ( renal function is normal ) if we need additional HR control and if the BP is too low to increase the metoprolol  I would think that she is not a candidate for anticoagulation given her brain mets and overall poor prognosis.    Will defer to primary team and Oncology .   She has not walked on a month.  Has cancer and so she likely is hypercoagulable.  Will get a d-dimer.  This could be a PE.  Further eval per primary team   Is extremely weak.   I would not pursue any further cardiac evaluation  Will treat symptomatically    Disposition:  Length of Stay: 1  Thayer Headings, Brooke Bonito., MD, [facility] 05/25/2015, 10:14 AM Office [phone] Pager [phone]

## 2015-05-25 NOTE — NC FL2 (Signed)
[place] LEVEL OF CARE SCREENING TOOL     IDENTIFICATION  Patient Name: [name] Birthdate: [birth year] Sex: female Admission Date (Current Location): 05/23/2015  [facility] and [place] Number:  Herbalist and Address:  [facility] [place], [place] [address], [place], [place] [address]      Provider Number: [id number]  Attending Physician Name and Address:  Orson Eva, MD  Relative Name and Phone Number:       Current Level of Care: Hospital Recommended Level of Care: [place] Prior Approval Number:    Date Approved/Denied:   PASRR Number:    Discharge Plan: SNF    Current Diagnoses: Patient Active Problem List   Diagnosis Date Noted  . UTI (lower urinary tract infection) 05/23/2015  . Sepsis ([place]) 05/23/2015  . NSTEMI (non-ST elevated myocardial infarction) ([place]) 05/23/2015  . Pressure ulcer 05/08/2015  . Hemiplegia ([place])   . Metastasis to brain ([place])   . Left hemiparesis ([place]) 05/07/2015  . Essential hypertension 05/07/2015  . Dilated bile duct 05/07/2015  . Anorexia 03/13/2015  . Iron deficiency anemia 03/13/2015  . Ileostomy present ([place]) 03/23/2014  . Ileostomy in place [facility]) 07/14/2013  . Other and unspecified hyperlipidemia 07/13/2013  . Nausea without vomiting 07/10/2013  . Colonic obstruction ([place]) 07/09/2013  . 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 HTN (hypertension) 07/06/2013    Orientation RESPIRATION BLADDER Height & Weight     Self, Situation  O2 (2L) Incontinent Weight: 119 lb 6.4 oz (54.159 kg) Height:  5\' 4"  (162.6 cm)  BEHAVIORAL SYMPTOMS/MOOD NEUROLOGICAL BOWEL NUTRITION STATUS  Other (Comment) ( no behaviors)   Incontinent Diet (Heart Healthy)  AMBULATORY STATUS COMMUNICATION OF NEEDS Skin   Extensive Assist Verbally PU Stage and Appropriate Care   PU Stage 2 Dressing: BID (Partial thickness loss of dermis presenting as a  shallow open ulcer with a red, pink wound bed without slough.)                   Personal Care Assistance Level of Assistance  Bathing, Feeding, Dressing Bathing Assistance: Limited assistance Feeding assistance: Limited assistance Dressing Assistance: Limited assistance     Functional Limitations Info  Sight, Hearing, Speech Sight Info: Adequate Hearing Info: Adequate Speech Info: Adequate    SPECIAL CARE FACTORS FREQUENCY  PT (By licensed PT), OT (By licensed OT)     PT Frequency: 3 OT Frequency: 3            Contractures Contractures Info: Not present    Additional Factors Info  Code Status, Allergies, Psychotropic, Insulin Sliding Scale, Isolation Precautions Code Status Info: Full Allergies Info: NKA Psychotropic Info: none Insulin Sliding Scale Info: none Isolation Precautions Info: none     Current Medications (05/25/2015):  This is the current hospital active medication list Current Facility-Administered Medications  Medication Dose Route Frequency Provider Last Rate Last Dose  . 0.9 %  sodium chloride infusion   Intravenous Continuous Delsa Grana, PA-C 125 mL/hr at 05/25/15 1137    . acetaminophen (TYLENOL) tablet 650 mg  650 mg Oral Q4H PRN Vianne Bulls, MD      . aspirin EC tablet 81 mg  81 mg Oral Daily Vianne Bulls, MD   81 mg at 05/25/15 1047  . ceFEPIme (MAXIPIME) 2 g in dextrose 5 % 50 mL IVPB  2 g Intravenous Q24H Vianne Bulls, MD   2 g at 05/25/15 1049  . [  START ON 05/26/2015] dexamethasone (DECADRON) tablet 4 mg  4 mg Oral Daily Ladell Pier, MD      . diltiazem (CARDIZEM) 100 mg in dextrose 5 % 100 mL (1 mg/mL) infusion  5-15 mg/hr Intravenous Titrated Dianne Dun, NP 5 mL/hr at 05/25/15 0020 5 mg/hr at 05/25/15 0020  . LORazepam (ATIVAN) tablet 0.25 mg  0.25 mg Oral Q6H PRN Vianne Bulls, MD   0.25 mg at 05/24/15 0649  . metoprolol tartrate (LOPRESSOR) tablet 12.5 mg  12.5 mg Oral BID Thayer Headings, MD   12.5 mg at 05/25/15  1047  . morphine CONCENTRATE 10 MG/0.5ML oral solution 10 mg  10 mg Oral Q2H PRN Vianne Bulls, MD      . nitroGLYCERIN (NITROSTAT) SL tablet 0.4 mg  0.4 mg Sublingual Q5 Min x 3 PRN Vianne Bulls, MD      . ondansetron (ZOFRAN) injection 4 mg  4 mg Intravenous Q6H PRN Vianne Bulls, MD      . prochlorperazine (COMPAZINE) tablet 2.5 mg  2.5 mg Oral TID Vianne Bulls, MD   2.5 mg at 05/25/15 1046  . senna (SENOKOT) tablet 8.6 mg  1 tablet Oral BID Ilene Qua Opyd, MD   8.6 mg at 05/24/15 1102  . vancomycin (VANCOCIN) IVPB 750 mg/150 ml premix  750 mg Intravenous Q24H Randa Spike, RPH   750 mg at 05/24/15 2119     Discharge Medications: Please see discharge summary for a list of discharge medications.  Relevant Imaging Results:  Relevant Lab Results:   Additional Information    Lilly [place], LCSW

## 2015-05-25 NOTE — Progress Notes (Signed)
IP PROGRESS NOTE  Subjective:   [name] was scheduled for an appointment at the Cancer center today. She presented to the emergency room yesterday with an altered mental status, fever, and chest pain.  She is being treated for urinary tract infection and possible myocardial infarction. She has noted improvement in the left-sided weakness. She continues to have anorexia. No nausea. No pain at present. No dyspnea.  Objective: Vital signs in last 24 hours: Blood pressure 104/65, pulse 97, temperature 97.9 F (36.6 C), temperature source Oral, resp. rate 20, height 5\' 4"  (1.626 m), weight 119 lb 6.4 oz (54.159 kg), SpO2 99 %.  Intake/Output from previous day:    Physical Exam:  HEENT: No thrush Lungs: Moves air bilaterally, no respiratory distress, inspiratory rub at the right upper anterior chest Cardiac: Regular rate and rhythm Abdomen: Soft, mild tenderness bilaterally, no mass Extremities: No leg edema Neurologic: Alert and oriented, 4/5 strength in the left arm and leg  a  Lab Results:  Recent Labs  05/24/15 0607 05/25/15 0427  WBC 19.4* 14.7*  HGB 12.7 10.0*  HCT 37.9 30.2*  PLT 246 229    BMET  Recent Labs  05/23/15 2120 05/25/15 0427  NA 135 140  K 4.2 3.5  CL 102 113*  CO2 20* 19*  GLUCOSE 123* 123*  BUN 20 20  CREATININE 0.64 0.51  CALCIUM 8.9 8.0*    Studies/Results: Dg Chest 2 View  05/23/2015  CLINICAL DATA:  Altered mental status, weakness and fever. History of colon cancer. EXAM: CHEST  2 VIEW COMPARISON:  Chest x-ray dated 07/06/2013. FINDINGS: Mild cardiomegaly is stable. Atherosclerotic changes again noted at the aortic arch. Vague opacity at the left costophrenic angle is grossly stable, most compatible with chronic atelectasis or chronic pleural thickening. Lungs are otherwise clear. No evidence of pneumonia. Mild degenerative change again noted within the thoracic spine. Degenerative changes also noted at each shoulder. No acute- appearing  osseous abnormality. IMPRESSION: No active cardiopulmonary disease. Electronically Signed   By: Franki Cabot M.D.   On: 05/23/2015 21:41   Ct Head Wo Contrast  05/23/2015  CLINICAL DATA:  Acute onset of altered level of consciousness. Fever and malodorous urine. Nausea and vomiting. Initial encounter. EXAM: CT HEAD WITHOUT CONTRAST TECHNIQUE: Contiguous axial images were obtained from the base of the skull through the vertex without intravenous contrast. COMPARISON:  MRI of the brain performed 05/10/2015 FINDINGS: A 1.9 cm right parasagittal mass is again noted at the convexity, with surrounding decreased attenuation within the white matter. No midline shift is seen. Mild periventricular white matter change likely reflects small vessel ischemic microangiopathy. There is no evidence of acute intra or extra-axial hemorrhage. There is no evidence for acute infarct. The brainstem and fourth ventricle are within normal limits. The basal ganglia are unremarkable in appearance. The cerebral hemispheres demonstrate grossly normal gray-white differentiation. No mass effect or midline shift is seen. There is no evidence of fracture; visualized osseous structures are unremarkable in appearance. The orbits are within normal limits. The paranasal sinuses and mastoid air cells are well-aerated. No significant soft tissue abnormalities are seen. IMPRESSION: 1. 1.9 cm right parasagittal mass at the convexity, with surrounding decreased attenuation within the white matter. No midline shift seen. 2. Mild small vessel ischemic microangiopathy. Electronically Signed   By: Garald Balding M.D.   On: 05/23/2015 21:58   Ct Abdomen Pelvis W Contrast  05/24/2015  CLINICAL DATA:  Abdominal pain.  Sepsis.  History of colon cancer. EXAM: CT  ABDOMEN AND PELVIS WITH CONTRAST TECHNIQUE: Multidetector CT imaging of the abdomen and pelvis was performed using the standard protocol following bolus administration of intravenous contrast.  CONTRAST:  39mL OMNIPAQUE IOHEXOL 300 MG/ML SOLN, 77mL OMNIPAQUE IOHEXOL 300 MG/ML SOLN COMPARISON:  03/30/2015 FINDINGS: Lower chest: Small pleural effusions are noted left greater night. There is overlying compressive type atelectasis and consolidation involving the left lower lobe. Nodule in the lingula is identified measuring 7 mm, image number 1 of series 4. New from previous exam. New right lower lobe pulmonary nodule is identified measuring 4 mm, image 12 of series 4. Also new from previous exam is a lingular nodule measuring 4 mm, image 2 of series 4. Hepatobiliary: No focal liver abnormality identified. There has been interval progression of intrahepatic bile duct dilatation. The common bile duct measures 1.6 cm, image number 39 of series 6. On the previous exam this measured 0.9 cm. Distal common bile duct stricture is suspected, image 39 of series 6. Pancreas: Mild diffuse edema of the pancreas. The appearance is similar to previous exam. Spleen: Calcified granulomas identified within the spleen. Adrenals/Urinary Tract: Scratch set bilateral adrenal nodules are again identified. There is left-sided hydronephrosis, progressed from previous exam. Normal appearance of the right kidney. The urinary bladder appears within normal limits. Stomach/Bowel: The stomach and the small bowel loops have a normal course and caliber. No bowel obstruction. No dilated loops of small or large bowel loops identified at this time. Vascular/Lymphatic: Calcified atherosclerotic disease involves the abdominal aorta. No aneurysm. Index portacaval node measures 1 cm, image 30 of series 5. Previously 1.3 cm. Gastrohepatic ligament lymph node measures 9 mm, image 29 of series 5. Previously 1.2 cm. The index left periaortic node measures 1.5 cm, image 38 of series 5. Previously 1.1 cm. Reproductive: The uterus and the adnexal structures are unremarkable. Other: Small fat containing hernias are identified at the previous ostomy site  within the right lower quadrant ventral abdominal wall, image 51 of series 5. There is a small amount of adjacent fluid within the ventral abdominal wall measuring 2.7 by 0.9 cm, image 51 of series 5. No significant free fluid within the abdomen or pelvis and no fluid collections identified. Musculoskeletal: Spondylosis noted within the lumbar spine. No aggressive lytic or sclerotic bone lesions. IMPRESSION: 1. Interval progression of intrahepatic and common bile duct dilatation. A stricture involving the distal common bile duct is suspected. Underlying mass lesion cannot be excluded. 2. Progressive dilatation of the left renal collecting system to the level of the UPJ. Findings are compatible with hydronephrosis. No obstructing stone or mass noted. 3. Increase in size of left periaortic lymph node. Portacaval and gastrohepatic ligament lymph nodes have decreased in size in the interval. 4. New pulmonary nodules are identified within the lungs, suspicious for metastatic disease. Electronically Signed   By: Kerby Moors M.D.   On: 05/24/2015 13:11    Medications: I have reviewed the patient's current medications.  Assessment/Plan: 1. Stage IIIB (T3 N2a) poorly different it adenocarcinoma of the right colon, status post a right colectomy and end ileostomy 07/09/2013.  4 of 15 lymph nodes positive for metastatic carcinoma and 2 tumor deposits.   Cycle 1 adjuvant Xeloda 08/15/2013.   Cycle 2 adjuvant Xeloda 09/05/2013.   Cycle 3 adjuvant Xeloda with a dose reduction 10/03/2013.   Cycle 4 adjuvant Xeloda 10/25/2013.   Cycle 5 adjuvant Xeloda 11/16/2013.  Cycle 6 adjuvant Xeloda 12/06/2013  Cycle 7 of adjuvant Xeloda 12/27/2013  Cycle 8 of adjuvant  Xeloda 01/17/2014.  Restaging CT scans 02/14/2014 with stable 4 to 5 mm right lower lobe pulmonary nodule; interval enlargement of left periaortic lymphadenopathy.  Ileostomy takedown 03/23/2014  Restaging CT scan 09/04/2014 with stable  small pulmonary nodules. Stable and smaller mesenteric and retroperitoneal lymph nodes. No evidence of metastatic disease. 2. History of multiple nonmelanoma skin cancers. 3. History of hypertension. 4. Indeterminate 4 mm right lung base nodule on the CT abdomen 07/06/2013.  Chest CT 08/05/2013 with middle and right lower lobe nodules measuring up to 4 mm. Repeat chest CT planned at a 6-9 month interval.  Restaging chest CT 02/14/2014 with stable 4-5 mm right lower lobe pulmonary nodule. 5. Rash secondary to Xeloda. Resolved 6. 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 Resolved. 7. Iron deficiency anemia-stool Hemoccult positive 12/20/2014, colonoscopy 01/04/2015 negative other than diverticulosis in the sigmoid colon 8. Nausea/back pain/anorexia. Symptoms persist. CT chest/abdomen/pelvis 03/30/2015 with 2 tiny stable right lung nodules; new mild diffuse intrahepatic biliary ductal dilatation; no liver mass; new prominent gallbladder distention; new prominently dilated common bile duct; thickening and heterogeneity of the pancreatic head with new peripancreatic fat stranding extending into the central mesentery and anterior paranephric retroperitoneal spaces bilaterally; new main pancreatic duct dilatation; new diffuse left adrenal thickening was suggestion of a new 1.2 x 1.1 cm left adrenal nodule; new upper retroperitoneal lymphadenopathy.  MRI abdomen 04/09/2015 confirmed circumferential narrowing of the distal common bile duct and narrowing of the distal pancreatic duct. Severe biliary ductal dilatation  EUS 05/04/2015 confirmed a dilated common bile duct, mass near the ampulla, FNA biopsy of soft tissue lesion surrounding the bile duct-adenocarcinoma  Increased bile duct dilatation on CT 05/24/2015  New pulmonary nodules on the CT 05/24/2015 consistent with metastases  9. Left hemiplegia-MRI brain 05/08/2015 confirmed an isolated right frontoparietal metastasis with edema,  Decadron started 05/08/2015, left sided weakness improved  10. History of Sacral decubitus ulcer  11. Urinary tract infection  12. Left Hydronephrosis noted on the CT 05/24/2015   Ms. Pellman was admitted with probable urosepsis. The urinary tract infection may be related to obstruction of the left ureter. She appears to have abdominal carcinomatosis secondary to metastatic colon cancer. The prognosis is poor. I discussed the situation with her son. She is a candidate for Hospice care. She cannot enroll in hospice while staying at the [facility] facility. Recommendations: 1. Antibiotics, follow-up cultures per Dr. Carles Collet 2. Taper Decadron to once daily 3. [facility] referral if she is approved for Medicaid or can leave the skilled nursing facility 4. Please call Oncology as needed. I will check on her 05/28/2015 if she remains in the hospital. We will schedule an outpatient visit at the [facility].     LOS: 1 day   Betsy Coder, MD   05/25/2015, 11:50 AM

## 2015-05-25 NOTE — Progress Notes (Signed)
Initial Nutrition Assessment  DOCUMENTATION CODES:   Severe malnutrition in context of chronic illness  INTERVENTION:   - Provide Ensure Enlive po TID, each supplement provides 350 kcals and 20 grams of protein. - Continue to encourage good po intake of meals and supplements.  - Will continue to monitor for nutrition needs.   NUTRITION DIAGNOSIS:   Malnutrition related to chronic illness as evidenced by percent weight loss, severe depletion of muscle mass, severe depletion of body fat, energy intake < or equal to 75% for > or equal to 1 month.  GOAL:   Patient will meet greater than or equal to 90% of their needs  MONITOR:   PO intake, Supplement acceptance, Weight trends, Skin, I & O's, Labs, GOC  REASON FOR ASSESSMENT:   Malnutrition Screening Tool    ASSESSMENT:   82 y.o. female with PMH of HTN, hyperlipidemia, and colorectal cancer metastatic to the brain who presents  from her SNF with altered mental status and fever. Patient is typically confused and has significant cognitive deficits at baseline, but is able to communicate her basic needs and recognizes her family. Patient reported chest pain to son and was taken to ED.  Pt having frequent nausea which is likely from the radiation therapy and brain mass. Currently denies any issues and does not remember having CP.   Patient asleep and no family present at time of visit.  Spoke with RN who reports that patient has had very poor po intake since readmitted to the hospital.  RN states that patient said she was hungry this morning so the family ordered her a meal but pt only took about 1-2 bites of the food.  RN states that the family reported that patient has not had any intake for about 1 month.  Per MD note, pt has not walked for about 1 month either.   Nutrition Focused Physical Exam was completed.  Patient with severe muscle depletion, severe fat depletion, and no edema.  Patient appears extremely thin.  Per chart review,  pt has had a 20# / 14% severe weight loss x 3 months.    Patient is not meeting kcal or protein needs.  Will provide patient Ensure Enlive po TID.  Medications and labs reviewed.   Diet Order:  Diet Heart Room service appropriate?: Yes; Fluid consistency:: Thin  Skin:  Wound (see comment) (Stage II Pressure Ulcer - Sacrum)  Last BM:  Unknown  Height:   Ht Readings from Last 1 Encounters:  05/24/15 5\' 4"  (1.626 m)    Weight:   Wt Readings from Last 1 Encounters:  05/25/15 119 lb 6.4 oz (54.159 kg)    Ideal Body Weight:  54.5 kg  BMI:  Body mass index is 20.48 kg/(m^2).  Estimated Nutritional Needs:   Kcal:  1500-1700  Protein:  75-85 grams  Fluid:  1.7 L/day  EDUCATION NEEDS:   No education needs identified at this time  Veronda Prude, Dietetic Intern Pager: [phone]

## 2015-05-25 NOTE — Progress Notes (Signed)
Per Cardiology, administer ordered PO metoprolol while Cardizem drip continues. Will continue as ordered.

## 2015-05-25 NOTE — Consult Note (Signed)
[facility]  Date of Admission:  05/23/2015  Date of Consult:  05/25/2015  Reason for Consult: Staph aureus Bacteremia Referring Physician: CHAMP  Impression/Recommendation Staph aureus bacteremia and UTI Would Stop cefepime Continue vanco while awaiting her ID and sensi Repeat BCx Would not pursue Echo in this hospice eligible pt She denies presence of any foreign bodies (PIC, pacer, port)  +troponins  Colon CA with metastatic disease (lungs, brain).  She is hospice candidate per last Onc note.    Thank you so much for this interesting consult,   Bobby Rumpf (pager) [phone] [email or web address]  [name] is an 82 y.o. female.  HPI: 82 yo F with hx of metastatic colon CA (dx 07-2013) to CNS (dx 04-2015) when she presented with L sided paralysis (now getting XRT). She complained of crushing CP on 3-15, was then adm from SNF on 3-16 with temp 102.8, tachycardia, and worsened confusion. She had WBC 22.6 and lactic acid 2.27. She was started on vanco/zosyn in ED then changed to vanco/cefepime on adm. Her CT head showed 1.9 cm parasagital mass. She was started on decadron for this.  She was felt to be septic from a urinary source (foul smelling urine).  Her troponins were positive and she was started on heparin. She was seen by cardiology and this was stopped, felt to not be a candidate for invasive procedures.  Her course has been further complicated by a-fib,  bradycardia with pauses.  Afebrile since 3-15, WBC now improved to 14.7.   Past Medical History  Diagnosis Date  . Hypertension   . Hyperlipidemia   . Arthritis   . Cancer [facility])     colon    Past Surgical History  Procedure Laterality Date  . Total hip arthroplasty    . Colonoscopy with propofol N/A 07/08/2013    Procedure: COLONOSCOPY WITH PROPOFOL;  Surgeon: Wonda Horner, MD;  Location: [facility];  Service: Endoscopy;  Laterality: N/A;  . Partial colectomy Right  07/09/2013    Procedure: PARTIAL COLECTOMY AND EXCISION OF NEVUS FROM RIGHT ABDOMINAL WALL;  Surgeon: Earnstine Regal, MD;  Location: [facility];  Service: General;  Laterality: Right;  . Colon surgery    . Eye surgery      bilateral cataract with lens implants  . Ileostomy closure N/A 03/23/2014    Procedure: ILEOSTOMY CLOSURE;  Surgeon: Armandina Gemma, MD;  Location: [facility];  Service: General;  Laterality: N/A;  . Parastomal hernia repair N/A 03/23/2014    Procedure: HERNIA REPAIR PARASTOMAL;  Surgeon: Armandina Gemma, MD;  Location: [facility];  Service: General;  Laterality: N/A;     No Known Allergies  Medications:  Scheduled: . aspirin EC  81 mg Oral Daily  . ceFEPime (MAXIPIME) IV  2 g Intravenous Q24H  . [START ON 05/26/2015] dexamethasone  4 mg Oral Daily  . feeding supplement (ENSURE ENLIVE)  237 mL Oral TID BM  . prochlorperazine  2.5 mg Oral TID  . senna  1 tablet Oral BID  . vancomycin  750 mg Intravenous Q24H    Abtx:  Anti-infectives    Start     Dose/Rate Route Frequency Ordered Stop   05/25/15 0600  ceFEPIme (MAXIPIME) 2 g in dextrose 5 % 50 mL IVPB     2 g 100 mL/hr over 30 Minutes Intravenous Every 24 hours 05/24/15 0458     05/24/15 2200  vancomycin (VANCOCIN) IVPB 750 mg/150 ml premix  Status:  Discontinued  750 mg 150 mL/hr over 60 Minutes Intravenous Every 24 hours 05/23/15 2207 05/24/15 0006   05/24/15 2200  vancomycin (VANCOCIN) IVPB 750 mg/150 ml premix     750 mg 150 mL/hr over 60 Minutes Intravenous Every 24 hours 05/24/15 1421     05/24/15 0600  piperacillin-tazobactam (ZOSYN) IVPB 3.375 g  Status:  Discontinued     3.375 g 12.5 mL/hr over 240 Minutes Intravenous Every 8 hours 05/23/15 2207 05/24/15 0006   05/24/15 0015  ceFEPIme (MAXIPIME) 2 g in dextrose 5 % 50 mL IVPB     2 g 100 mL/hr over 30 Minutes Intravenous  Once 05/24/15 0006 05/24/15 0715   05/23/15 2200  piperacillin-tazobactam (ZOSYN) IVPB 3.375 g     3.375 g 100 mL/hr over 30 Minutes Intravenous  STAT 05/23/15 2147 05/23/15 2246   05/23/15 2200  vancomycin (VANCOCIN) IVPB 1000 mg/200 mL premix     1,000 mg 200 mL/hr over 60 Minutes Intravenous STAT 05/23/15 2147 05/24/15 0012      Total days of antibiotics: 2 vanco/cefepime          Social History:  reports that she has never smoked. She has never used smokeless tobacco. She reports that she does not drink alcohol or use illicit drugs.  Family History  Problem Relation Age of Onset  . Lung cancer Sister     General ROS: she is unclear how she got to [facility] from SNF. she is otherwise pleasant and oriented. see HPI.   Blood pressure 95/54, pulse 65, temperature 98 F (36.7 C), temperature source Oral, resp. rate 18, height '5\' 4"'$  (1.626 m), weight 54.159 kg (119 lb 6.4 oz), SpO2 97 %. General appearance: alert, cooperative and no distress Eyes: negative findings: conjunctivae and sclerae normal and pupils equal, round, reactive to light and accomodation Throat: normal findings: oropharynx pink & moist without lesions or evidence of thrush Neck: no adenopathy and supple, symmetrical, trachea midline Lungs: clear to auscultation bilaterally Heart: regular rate and rhythm Abdomen: normal findings: bowel sounds normal and soft, non-tender Extremities: edema none and no nail or finger/toe lesions consistent with emboli Skin: pale  Grossly normal light touch BLE   Results for orders placed or performed during the hospital encounter of 05/23/15 (from the past 48 hour(s))  CBC WITH DIFFERENTIAL     Status: Abnormal   Collection Time: 05/23/15  9:20 PM  Result Value Ref Range   WBC 22.6 (H) 4.0 - 10.5 K/uL    Comment: WHITE COUNT CONFIRMED ON SMEAR   RBC 4.59 3.87 - 5.11 MIL/uL   Hemoglobin 13.3 12.0 - 15.0 g/dL   HCT 39.6 36.0 - 46.0 %   MCV 86.3 78.0 - 100.0 fL   MCH 29.0 26.0 - 34.0 pg   MCHC 33.6 30.0 - 36.0 g/dL   RDW 16.6 (H) 11.5 - 15.5 %   Platelets 245 150 - 400 K/uL    Comment: REPEATED TO VERIFY SPECIMEN CHECKED FOR  CLOTS PLATELET COUNT CONFIRMED BY SMEAR    Neutrophils Relative % 94 %   Neutro Abs 21.2 (H) 1.7 - 7.7 K/uL   Lymphocytes Relative 3 %   Lymphs Abs 0.6 (L) 0.7 - 4.0 K/uL   Monocytes Relative 4 %   Monocytes Absolute 0.9 0.1 - 1.0 K/uL   Eosinophils Relative 0 %   Eosinophils Absolute 0.0 0.0 - 0.7 K/uL   Basophils Relative 0 %   Basophils Absolute 0.0 0.0 - 0.1 K/uL   WBC Morphology DOHLE BODIES  Comprehensive metabolic panel     Status: Abnormal   Collection Time: 05/23/15  9:20 PM  Result Value Ref Range   Sodium 135 135 - 145 mmol/L   Potassium 4.2 3.5 - 5.1 mmol/L   Chloride 102 101 - 111 mmol/L   CO2 20 (L) 22 - 32 mmol/L   Glucose, Bld 123 (H) 65 - 99 mg/dL   BUN 20 6 - 20 mg/dL   Creatinine, Ser 0.64 0.44 - 1.00 mg/dL   Calcium 8.9 8.9 - 10.3 mg/dL   Total Protein 6.0 (L) 6.5 - 8.1 g/dL   Albumin 2.5 (L) 3.5 - 5.0 g/dL   AST 20 15 - 41 U/L   ALT 20 14 - 54 U/L   Alkaline Phosphatase 80 38 - 126 U/L   Total Bilirubin 0.9 0.3 - 1.2 mg/dL   GFR calc non Af Amer >60 >60 mL/min   GFR calc Af Amer >60 >60 mL/min    Comment: (NOTE) The eGFR has been calculated using the CKD EPI equation. This calculation has not been validated in all clinical situations. eGFR's persistently <60 mL/min signify possible Chronic Kidney Disease.    Anion gap 13 5 - 15  Culture, blood (Routine X 2) w Reflex to ID Panel     Status: None (Preliminary result)   Collection Time: 05/23/15  9:20 PM  Result Value Ref Range   Specimen Description BLOOD RIGHT HAND    Special Requests IN PEDIATRIC BOTTLE 2CC    Culture  Setup Time      GRAM POSITIVE COCCI IN CLUSTERS PEDIATRIC BOTTLE S. WEST,RN AT 5102 ON [id number] BY Rhea Bleacher    Culture      STAPHYLOCOCCUS AUREUS Performed at [facility]    Report Status PENDING   Protime-INR     Status: None   Collection Time: 05/23/15  9:20 PM  Result Value Ref Range   Prothrombin Time 14.4 11.6 - 15.2 seconds   INR 1.10 0.00 - 1.49  I-stat  troponin, ED     Status: Abnormal   Collection Time: 05/23/15  9:28 PM  Result Value Ref Range   Troponin i, poc 0.49 (HH) 0.00 - 0.08 ng/mL   Comment NOTIFIED PHYSICIAN    Comment 3            Comment: Due to the release kinetics of cTnI, a negative result within the first hours of the onset of symptoms does not rule out myocardial infarction with certainty. If myocardial infarction is still suspected, repeat the test at appropriate intervals.   I-Stat CG4 Lactic Acid, ED  (not at [facility])     Status: Abnormal   Collection Time: 05/23/15  9:31 PM  Result Value Ref Range   Lactic Acid, Venous 2.27 (HH) 0.5 - 2.0 mmol/L   Comment NOTIFIED PHYSICIAN   Culture, blood (Routine X 2) w Reflex to ID Panel     Status: None (Preliminary result)   Collection Time: 05/23/15  9:35 PM  Result Value Ref Range   Specimen Description BLOOD LEFT FOREARM    Special Requests BOTTLES DRAWN AEROBIC AND ANAEROBIC 5CC    Culture  Setup Time      GRAM POSITIVE COCCI IN CLUSTERS IN BOTH AEROBIC AND ANAEROBIC BOTTLES CRITICAL RESULT CALLED TO, READ BACK BY AND VERIFIED WITH: I BOULOUDENE,RN AT 1333 05/24/15 BY L BENFIELD    Culture      STAPHYLOCOCCUS AUREUS Performed at [facility]    Report Status PENDING   Urinalysis,  Routine w reflex microscopic (not at [facility])     Status: Abnormal   Collection Time: 05/23/15  9:55 PM  Result Value Ref Range   Color, Urine AMBER (A) YELLOW    Comment: BIOCHEMICALS MAY BE AFFECTED BY COLOR   APPearance CLOUDY (A) CLEAR   Specific Gravity, Urine 1.028 1.005 - 1.030   pH 6.5 5.0 - 8.0   Glucose, UA NEGATIVE NEGATIVE mg/dL   Hgb urine dipstick MODERATE (A) NEGATIVE   Bilirubin Urine NEGATIVE NEGATIVE   Ketones, ur NEGATIVE NEGATIVE mg/dL   Protein, ur 100 (A) NEGATIVE mg/dL   Nitrite POSITIVE (A) NEGATIVE   Leukocytes, UA MODERATE (A) NEGATIVE  Urine culture     Status: None (Preliminary result)   Collection Time: 05/23/15  9:55 PM  Result Value Ref Range     Specimen Description URINE, CATHETERIZED    Special Requests Normal    Culture      >=100,000 COLONIES/mL STAPHYLOCOCCUS AUREUS Performed at [facility]    Report Status PENDING   Urine microscopic-add on     Status: Abnormal   Collection Time: 05/23/15  9:55 PM  Result Value Ref Range   Squamous Epithelial / LPF 0-5 (A) NONE SEEN   WBC, UA 6-30 0 - 5 WBC/hpf   RBC / HPF 6-30 0 - 5 RBC/hpf   Bacteria, UA MANY (A) NONE SEEN   Casts HYALINE CASTS (A) NEGATIVE  Lactic acid, plasma     Status: None   Collection Time: 05/24/15 12:37 AM  Result Value Ref Range   Lactic Acid, Venous 1.3 0.5 - 2.0 mmol/L  Procalcitonin     Status: None   Collection Time: 05/24/15 12:37 AM  Result Value Ref Range   Procalcitonin 1.35 ng/mL    Comment:        Interpretation: PCT > 0.5 ng/mL and <= 2 ng/mL: Systemic infection (sepsis) is possible, but other conditions are known to elevate PCT as well. (NOTE)         ICU PCT Algorithm               Non ICU PCT Algorithm    ----------------------------     ------------------------------         PCT < 0.25 ng/mL                 PCT < 0.1 ng/mL     Stopping of antibiotics            Stopping of antibiotics       strongly encouraged.               strongly encouraged.    ----------------------------     ------------------------------       PCT level decrease by               PCT < 0.25 ng/mL       >= 80% from peak PCT       OR PCT 0.25 - 0.5 ng/mL          Stopping of antibiotics                                             encouraged.     Stopping of antibiotics           encouraged.    ----------------------------     ------------------------------  PCT level decrease by              PCT >= 0.25 ng/mL       < 80% from peak PCT        AND PCT >= 0.5 ng/mL             Continuing antibiotics                                              encouraged.       Continuing antibiotics            encouraged.    ----------------------------      ------------------------------     PCT level increase compared          PCT > 0.5 ng/mL         with peak PCT AND          PCT >= 0.5 ng/mL             Escalation of antibiotics                                          strongly encouraged.      Escalation of antibiotics        strongly encouraged.   Troponin I     Status: Abnormal   Collection Time: 05/24/15 12:37 AM  Result Value Ref Range   Troponin I 0.45 (H) <0.031 ng/mL    Comment:        PERSISTENTLY INCREASED TROPONIN VALUES IN THE RANGE OF 0.04-0.49 ng/mL CAN BE SEEN IN:       -UNSTABLE ANGINA       -CONGESTIVE HEART FAILURE       -MYOCARDITIS       -CHEST TRAUMA       -ARRYHTHMIAS       -LATE PRESENTING MYOCARDIAL INFARCTION       -COPD   CLINICAL FOLLOW-UP RECOMMENDED.   Brain natriuretic peptide     Status: Abnormal   Collection Time: 05/24/15 12:37 AM  Result Value Ref Range   B Natriuretic Peptide 332.5 (H) 0.0 - 100.0 pg/mL  Lactic acid, plasma     Status: None   Collection Time: 05/24/15  4:03 AM  Result Value Ref Range   Lactic Acid, Venous 1.7 0.5 - 2.0 mmol/L  Troponin I     Status: Abnormal   Collection Time: 05/24/15  6:07 AM  Result Value Ref Range   Troponin I 0.63 (HH) <0.031 ng/mL    Comment:        POSSIBLE MYOCARDIAL ISCHEMIA. SERIAL TESTING RECOMMENDED. CRITICAL RESULT CALLED TO, READ BACK BY AND VERIFIED WITH: Natasha Bence, RN [phone] 05/24/15 BY A NORMAN   CBC     Status: Abnormal   Collection Time: 05/24/15  6:07 AM  Result Value Ref Range   WBC 19.4 (H) 4.0 - 10.5 K/uL   RBC 4.37 3.87 - 5.11 MIL/uL   Hemoglobin 12.7 12.0 - 15.0 g/dL   HCT 37.9 36.0 - 46.0 %   MCV 86.7 78.0 - 100.0 fL   MCH 29.1 26.0 - 34.0 pg   MCHC 33.5 30.0 - 36.0 g/dL   RDW 16.7 (H) 11.5 - 15.5 %   Platelets 246 150 - 400 K/uL  Heparin level (unfractionated)     Status: Abnormal   Collection Time: 05/24/15  1:15 PM  Result Value Ref Range   Heparin Unfractionated <0.10 (L) 0.30 - 0.70 IU/mL    Comment:          IF HEPARIN RESULTS ARE BELOW EXPECTED VALUES, AND PATIENT DOSAGE HAS BEEN CONFIRMED, SUGGEST FOLLOW UP TESTING OF ANTITHROMBIN III LEVELS.   Troponin I     Status: Abnormal   Collection Time: 05/24/15  2:05 PM  Result Value Ref Range   Troponin I 0.59 (HH) <0.031 ng/mL    Comment:        POSSIBLE MYOCARDIAL ISCHEMIA. SERIAL TESTING RECOMMENDED. CRITICAL VALUE NOTED.  VALUE IS CONSISTENT WITH PREVIOUSLY REPORTED AND CALLED VALUE.   MRSA PCR Screening     Status: None   Collection Time: 05/24/15  7:02 PM  Result Value Ref Range   MRSA by PCR NEGATIVE NEGATIVE    Comment:        The GeneXpert MRSA Assay (FDA approved for NASAL specimens only), is one component of a comprehensive MRSA colonization surveillance program. It is not intended to diagnose MRSA infection nor to guide or monitor treatment for MRSA infections.   CBC     Status: Abnormal   Collection Time: 05/25/15  4:27 AM  Result Value Ref Range   WBC 14.7 (H) 4.0 - 10.5 K/uL   RBC 3.44 (L) 3.87 - 5.11 MIL/uL   Hemoglobin 10.0 (L) 12.0 - 15.0 g/dL    Comment: RESULT REPEATED AND VERIFIED DELTA CHECK NOTED    HCT 30.2 (L) 36.0 - 46.0 %   MCV 87.8 78.0 - 100.0 fL   MCH 29.1 26.0 - 34.0 pg   MCHC 33.1 30.0 - 36.0 g/dL   RDW 17.3 (H) 11.5 - 15.5 %   Platelets 229 150 - 400 K/uL  Comprehensive metabolic panel     Status: Abnormal   Collection Time: 05/25/15  4:27 AM  Result Value Ref Range   Sodium 140 135 - 145 mmol/L   Potassium 3.5 3.5 - 5.1 mmol/L   Chloride 113 (H) 101 - 111 mmol/L   CO2 19 (L) 22 - 32 mmol/L   Glucose, Bld 123 (H) 65 - 99 mg/dL   BUN 20 6 - 20 mg/dL   Creatinine, Ser 0.51 0.44 - 1.00 mg/dL   Calcium 8.0 (L) 8.9 - 10.3 mg/dL   Total Protein 4.9 (L) 6.5 - 8.1 g/dL   Albumin 1.8 (L) 3.5 - 5.0 g/dL   AST 19 15 - 41 U/L   ALT 20 14 - 54 U/L   Alkaline Phosphatase 68 38 - 126 U/L   Total Bilirubin 0.3 0.3 - 1.2 mg/dL   GFR calc non Af Amer >60 >60 mL/min   GFR calc Af Amer >60 >60  mL/min    Comment: (NOTE) The eGFR has been calculated using the CKD EPI equation. This calculation has not been validated in all clinical situations. eGFR's persistently <60 mL/min signify possible Chronic Kidney Disease.    Anion gap 8 5 - 15  D-dimer, quantitative (not at [facility])     Status: Abnormal   Collection Time: 05/25/15 12:32 PM  Result Value Ref Range   D-Dimer, Quant 2.32 (H) 0.00 - 0.50 ug/mL-FEU    Comment: (NOTE) At the manufacturer cut-off of 0.50 ug/mL FEU, this assay has been documented to exclude PE with a sensitivity and negative predictive value of 97 to 99%.  At this time, this assay has not  been approved by the FDA to exclude DVT/VTE. Results should be correlated with clinical presentation.       Component Value Date/Time   SDES URINE, CATHETERIZED 05/23/2015 2155   SPECREQUEST Normal 05/23/2015 2155   CULT  05/23/2015 2155    >=100,000 COLONIES/mL STAPHYLOCOCCUS AUREUS Performed at [facility] PENDING 05/23/2015 2155   Dg Chest 2 View  05/23/2015  CLINICAL DATA:  Altered mental status, weakness and fever. History of colon cancer. EXAM: CHEST  2 VIEW COMPARISON:  Chest x-ray dated 07/06/2013. FINDINGS: Mild cardiomegaly is stable. Atherosclerotic changes again noted at the aortic arch. Vague opacity at the left costophrenic angle is grossly stable, most compatible with chronic atelectasis or chronic pleural thickening. Lungs are otherwise clear. No evidence of pneumonia. Mild degenerative change again noted within the thoracic spine. Degenerative changes also noted at each shoulder. No acute- appearing osseous abnormality. IMPRESSION: No active cardiopulmonary disease. Electronically Signed   By: Franki Cabot M.D.   On: 05/23/2015 21:41   Ct Head Wo Contrast  05/23/2015  CLINICAL DATA:  Acute onset of altered level of consciousness. Fever and malodorous urine. Nausea and vomiting. Initial encounter. EXAM: CT HEAD WITHOUT CONTRAST  TECHNIQUE: Contiguous axial images were obtained from the base of the skull through the vertex without intravenous contrast. COMPARISON:  MRI of the brain performed 05/10/2015 FINDINGS: A 1.9 cm right parasagittal mass is again noted at the convexity, with surrounding decreased attenuation within the white matter. No midline shift is seen. Mild periventricular white matter change likely reflects small vessel ischemic microangiopathy. There is no evidence of acute intra or extra-axial hemorrhage. There is no evidence for acute infarct. The brainstem and fourth ventricle are within normal limits. The basal ganglia are unremarkable in appearance. The cerebral hemispheres demonstrate grossly normal gray-white differentiation. No mass effect or midline shift is seen. There is no evidence of fracture; visualized osseous structures are unremarkable in appearance. The orbits are within normal limits. The paranasal sinuses and mastoid air cells are well-aerated. No significant soft tissue abnormalities are seen. IMPRESSION: 1. 1.9 cm right parasagittal mass at the convexity, with surrounding decreased attenuation within the white matter. No midline shift seen. 2. Mild small vessel ischemic microangiopathy. Electronically Signed   By: Garald Balding M.D.   On: 05/23/2015 21:58   Ct Abdomen Pelvis W Contrast  05/24/2015  CLINICAL DATA:  Abdominal pain.  Sepsis.  History of colon cancer. EXAM: CT ABDOMEN AND PELVIS WITH CONTRAST TECHNIQUE: Multidetector CT imaging of the abdomen and pelvis was performed using the standard protocol following bolus administration of intravenous contrast. CONTRAST:  60m OMNIPAQUE IOHEXOL 300 MG/ML SOLN, 256mOMNIPAQUE IOHEXOL 300 MG/ML SOLN COMPARISON:  03/30/2015 FINDINGS: Lower chest: Small pleural effusions are noted left greater night. There is overlying compressive type atelectasis and consolidation involving the left lower lobe. Nodule in the lingula is identified measuring 7 mm, image  number 1 of series 4. New from previous exam. New right lower lobe pulmonary nodule is identified measuring 4 mm, image 12 of series 4. Also new from previous exam is a lingular nodule measuring 4 mm, image 2 of series 4. Hepatobiliary: No focal liver abnormality identified. There has been interval progression of intrahepatic bile duct dilatation. The common bile duct measures 1.6 cm, image number 39 of series 6. On the previous exam this measured 0.9 cm. Distal common bile duct stricture is suspected, image 39 of series 6. Pancreas: Mild diffuse edema of the pancreas. The appearance  is similar to previous exam. Spleen: Calcified granulomas identified within the spleen. Adrenals/Urinary Tract: Scratch set bilateral adrenal nodules are again identified. There is left-sided hydronephrosis, progressed from previous exam. Normal appearance of the right kidney. The urinary bladder appears within normal limits. Stomach/Bowel: The stomach and the small bowel loops have a normal course and caliber. No bowel obstruction. No dilated loops of small or large bowel loops identified at this time. Vascular/Lymphatic: Calcified atherosclerotic disease involves the abdominal aorta. No aneurysm. Index portacaval node measures 1 cm, image 30 of series 5. Previously 1.3 cm. Gastrohepatic ligament lymph node measures 9 mm, image 29 of series 5. Previously 1.2 cm. The index left periaortic node measures 1.5 cm, image 38 of series 5. Previously 1.1 cm. Reproductive: The uterus and the adnexal structures are unremarkable. Other: Small fat containing hernias are identified at the previous ostomy site within the right lower quadrant ventral abdominal wall, image 51 of series 5. There is a small amount of adjacent fluid within the ventral abdominal wall measuring 2.7 by 0.9 cm, image 51 of series 5. No significant free fluid within the abdomen or pelvis and no fluid collections identified. Musculoskeletal: Spondylosis noted within the lumbar  spine. No aggressive lytic or sclerotic bone lesions. IMPRESSION: 1. Interval progression of intrahepatic and common bile duct dilatation. A stricture involving the distal common bile duct is suspected. Underlying mass lesion cannot be excluded. 2. Progressive dilatation of the left renal collecting system to the level of the UPJ. Findings are compatible with hydronephrosis. No obstructing stone or mass noted. 3. Increase in size of left periaortic lymph node. Portacaval and gastrohepatic ligament lymph nodes have decreased in size in the interval. 4. New pulmonary nodules are identified within the lungs, suspicious for metastatic disease. Electronically Signed   By: Kerby Moors M.D.   On: 05/24/2015 13:11   Recent Results (from the past 240 hour(s))  Culture, blood (Routine X 2) w Reflex to ID Panel     Status: None (Preliminary result)   Collection Time: 05/23/15  9:20 PM  Result Value Ref Range Status   Specimen Description BLOOD RIGHT HAND  Final   Special Requests IN PEDIATRIC BOTTLE [place]  Final   Culture  Setup Time   Final    GRAM POSITIVE COCCI IN CLUSTERS PEDIATRIC BOTTLE S. WEST,RN AT 8250 ON [id number] BY Rhea Bleacher    Culture   Final    STAPHYLOCOCCUS AUREUS Performed at [facility]    Report Status PENDING  Incomplete  Culture, blood (Routine X 2) w Reflex to ID Panel     Status: None (Preliminary result)   Collection Time: 05/23/15  9:35 PM  Result Value Ref Range Status   Specimen Description BLOOD LEFT FOREARM  Final   Special Requests BOTTLES DRAWN AEROBIC AND ANAEROBIC 5CC  Final   Culture  Setup Time   Final    GRAM POSITIVE COCCI IN CLUSTERS IN BOTH AEROBIC AND ANAEROBIC BOTTLES CRITICAL RESULT CALLED TO, READ BACK BY AND VERIFIED WITH: I BOULOUDENE,RN AT 1333 05/24/15 BY L BENFIELD    Culture   Final    STAPHYLOCOCCUS AUREUS Performed at [facility]    Report Status PENDING  Incomplete  Urine culture     Status: None (Preliminary result)    Collection Time: 05/23/15  9:55 PM  Result Value Ref Range Status   Specimen Description URINE, CATHETERIZED  Final   Special Requests Normal  Final   Culture   Final    >=  100,000 COLONIES/mL STAPHYLOCOCCUS AUREUS Performed at [facility]    Report Status PENDING  Incomplete  MRSA PCR Screening     Status: None   Collection Time: 05/24/15  7:02 PM  Result Value Ref Range Status   MRSA by PCR NEGATIVE NEGATIVE Final    Comment:        The GeneXpert MRSA Assay (FDA approved for NASAL specimens only), is one component of a comprehensive MRSA colonization surveillance program. It is not intended to diagnose MRSA infection nor to guide or monitor treatment for MRSA infections.       05/25/2015, 5:04 PM     LOS: 1 day    Records and images were personally reviewed where available.

## 2015-05-25 NOTE — Progress Notes (Signed)
HR dropped to 43, patient asymptomatic. HR currently at 54 in A-Fib. MD Tat paged to be made aware, awaiting response. BP 97/54, O2 Sats stable at 97% RA. Denies SOB.

## 2015-05-25 NOTE — Progress Notes (Addendum)
PROGRESS NOTE  [name] [id number] DOB: 1924-06-20 DOA: 05/23/2015 PCP:  Melinda Crutch, MD  Brief History 82 y.o. 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 At baseline, the patient has mild cognitive deficit, but is able to express her needs and recognize her family. However, on the morning of admission, she reportedly called her son at approximately 10 AM to report crushing chest pain. He reportedly informed SNF staff of this complaint. When he went to visit her later in the afternoon, she was unable to recognize him and unable to give any information about her chest pain or other symptoms. She was noted to be febrile to 100.47F and a foul odor to the urine was also reported. EMS was activated and she was transported to the hospital for evaluation of acute confusional state and fever. In the emergency department, the patient was noted to have elevated troponin up to 0.63, WBC 22.6, lactic acid 2.27. The patient was started on vancomycin and cefepime and given 1.5 L bolus of normal saline. She was also started on a heparin drip due to concerns for possible NSTEMI. The patient was recently discharged to [facility] from the hospital on 05/11/2015 after suffering hemiparesis secondary to brain metastasis from her colon cancer.  Assessment/Plan: Sepsis secondary to UTI and Bacteremia--S.aureus - Meets sepsis criteria on admission with fever, tachycardia, leukocytosis, elevated lactate, grossly positive UA  - Blood and urine cultures--staph aureus - Empiric vancomycin and Zosyn administered in ED  -continue vanc -d/c cefepime - ID consulted -Chest x-ray negative for infiltrates Afib with RVR -05/25/15--echo EF 55-60%, moderate AI, HK of basalinferior myocardium -?due to PE-->CTA chest with  elevated D-Dimer -pt became bradycardic with diltiazem drip-->d/c -initially placed on metoprolol--hold due to bradycardia now with 6 second pause -cardiology following Elevated troponin/Chest pain -Likely demand ischemia -No chest pain presently -EKG--sinus rhythm with nonspecific T-wave inversion -AppreciateCardiology consult Left Hydronephrosis -suspect this may be due to her adenopathy -renal function remains preserved -consulted urology--discussed with Dr. Alyce Pagan pt has preserved renal function and pt is NOT to receive any further chemo or radiation from oncologic standpoint, performing surgery and placing another foreign body in the setting of sepsis is not indicated at this time Acute encephalopathy -Secondary to infectious process -At baseline, the patient is able to converse and clarify her needs -improved, back to baseline Abdominal pain -CT abdomen in setting of sepsis--Interval progression of intrahepatic and common bile duct dilatation. A stricture involving the distal common bile duct is Suspected. Progressive L-hydronephrosis -abd pain overall improving stage 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 See problem #1.  -On need to follow-up on duodenal biopsy pathology results from [facility]. Per oncology. -Minimal CBD obstruction and duodenal partial obstruction-- biopsies from [facility]-->showed adenocarcinoma from [facility] -Appreciate Dr. Ashok Cordia consult--case discussed with him Left hemiparesis secondary to metastatic colon cancer -metastatic disease noted on MRI head which shows a 19 mm high right frontoparietal mass consistent with solitary metastases. Moderate vasogenic edema without shift.  -Continue po Decadron -pt had one SRS fraction on 05/11/15 -pathology report from duodenal biopsy at [facility] adenocarcinoma  Hypertension -Resume home  dose Norvasc if BP climbs  -Hydralazine as needed. -discontinue losartan HCTZ-->BP remains stable -HCTZ contributing  to hyponatremia hyponatremia - likely due to volume depletion from poor oral intake -IV fluids-->improved  Family Communication: Son updated at beside 05/25/15 Disposition Plan: SNF when medically stable   Procedures/Studies: Dg Chest 2 View  05/23/2015  CLINICAL DATA:  Altered mental status, weakness and fever. History of colon cancer. EXAM: CHEST  2 VIEW COMPARISON:  Chest x-ray dated 07/06/2013. FINDINGS: Mild cardiomegaly is stable. Atherosclerotic changes again noted at the aortic arch. Vague opacity at the left costophrenic angle is grossly stable, most compatible with chronic atelectasis or chronic pleural thickening. Lungs are otherwise clear. No evidence of pneumonia. Mild degenerative change again noted within the thoracic spine. Degenerative changes also noted at each shoulder. No acute- appearing osseous abnormality. IMPRESSION: No active cardiopulmonary disease. Electronically Signed   By: Franki Cabot M.D.   On: 05/23/2015 21:41   Ct Head Wo Contrast  05/23/2015  CLINICAL DATA:  Acute onset of altered level of consciousness. Fever and malodorous urine. Nausea and vomiting. Initial encounter. EXAM: CT HEAD WITHOUT CONTRAST TECHNIQUE: Contiguous axial images were obtained from the base of the skull through the vertex without intravenous contrast. COMPARISON:  MRI of the brain performed 05/10/2015 FINDINGS: A 1.9 cm right parasagittal mass is again noted at the convexity, with surrounding decreased attenuation within the white matter. No midline shift is seen. Mild periventricular white matter change likely reflects small vessel ischemic microangiopathy. There is no evidence of acute intra or extra-axial hemorrhage. There is no evidence for acute infarct. The brainstem and fourth ventricle are within normal limits. The basal ganglia are unremarkable in appearance. The  cerebral hemispheres demonstrate grossly normal gray-white differentiation. No mass effect or midline shift is seen. There is no evidence of fracture; visualized osseous structures are unremarkable in appearance. The orbits are within normal limits. The paranasal sinuses and mastoid air cells are well-aerated. No significant soft tissue abnormalities are seen. IMPRESSION: 1. 1.9 cm right parasagittal mass at the convexity, with surrounding decreased attenuation within the white matter. No midline shift seen. 2. Mild small vessel ischemic microangiopathy. Electronically Signed   By: Garald Balding M.D.   On: 05/23/2015 21:58   Mr [name] [id number] Contrast  05/10/2015  CLINICAL DATA:  Colon cancer. LEFT hemiparesis, leg greater than arm. Confusion. SRS planning EXAM: MRI HEAD WITHOUT AND WITH CONTRAST TECHNIQUE: Multiplanar, multiecho pulse sequences of the brain and surrounding structures were obtained without and with intravenous contrast. CONTRAST:  52mL MULTIHANCE GADOBENATE DIMEGLUMINE 529 MG/ML IV SOLN COMPARISON:  1.5 tesla MR 05/08/2015. FINDINGS: Redemonstrated is 14 x 18 x 18 mm (R-L x A-P x C-C) superficial, but intra-axial mass, RIGHT posterior frontal cortex, parasagittal location with surrounding vasogenic edema. There is avid postcontrast enhancement. Marked surrounding vasogenic edema with no midline shift but slight depression of the RIGHT lateral ventricle. No additional brain lesions. No osseous lesions. Extracranial soft tissues unremarkable. No significant change from previous exam. IMPRESSION: Superficial, but intra-axial, RIGHT posterior frontal parasagittal metastasis. This appears to be a solitary lesion, approximately 2 cm. Extensive vasogenic edema but no midline shift. Electronically Signed   By: Staci Righter M.D.   On: 05/10/2015 15:32   Mr [name] [id number] Contrast  05/08/2015  CLINICAL DATA:  Left hemi paresis. History of colon cancer. Metastasis versus infarct. EXAM: MRI HEAD WITHOUT AND WITH  CONTRAST TECHNIQUE: Multiplanar, multiecho pulse sequences of the brain and surrounding structures were obtained without and with intravenous contrast. CONTRAST:  10 cc MultiHance intravenous COMPARISON:  None. FINDINGS: Calvarium and upper cervical  spine: No definitive skeletal metastasis. Orbits: Bilateral cataract resection.  Negative for mass Sinuses and Mastoids: Clear. Brain: There is an enhancing intra-axial mass in the parasagittal right frontal parietal lobe along the upper motor strip measuring 19 mm. Moderate vasogenic edema without shift or herniation. No other masses identified. MR signal shows dense cellularity. No definitive hemorrhagic component. No acute infarct, hemorrhage, hydrocephalus, or major vessel occlusion. Metastasis is unexpected finding, but to accelerate care these results will be called to the ordering clinician or representative by the Radiologist Assistant, and communication documented in the PACS or zVision Dashboard. IMPRESSION: 19 mm high right frontoparietal mass consistent with solitary metastasis. Moderate vasogenic edema without shift. Electronically Signed   By: Monte Fantasia M.D.   On: 05/08/2015 11:40   Ct Abdomen Pelvis W Contrast  05/24/2015  CLINICAL DATA:  Abdominal pain.  Sepsis.  History of colon cancer. EXAM: CT ABDOMEN AND PELVIS WITH CONTRAST TECHNIQUE: Multidetector CT imaging of the abdomen and pelvis was performed using the standard protocol following bolus administration of intravenous contrast. CONTRAST:  44mL OMNIPAQUE IOHEXOL 300 MG/ML SOLN, 34mL OMNIPAQUE IOHEXOL 300 MG/ML SOLN COMPARISON:  03/30/2015 FINDINGS: Lower chest: Small pleural effusions are noted left greater night. There is overlying compressive type atelectasis and consolidation involving the left lower lobe. Nodule in the lingula is identified measuring 7 mm, image number 1 of series 4. New from previous exam. New right lower lobe pulmonary nodule is identified measuring 4 mm, image 12 of  series 4. Also new from previous exam is a lingular nodule measuring 4 mm, image 2 of series 4. Hepatobiliary: No focal liver abnormality identified. There has been interval progression of intrahepatic bile duct dilatation. The common bile duct measures 1.6 cm, image number 39 of series 6. On the previous exam this measured 0.9 cm. Distal common bile duct stricture is suspected, image 39 of series 6. Pancreas: Mild diffuse edema of the pancreas. The appearance is similar to previous exam. Spleen: Calcified granulomas identified within the spleen. Adrenals/Urinary Tract: Scratch set bilateral adrenal nodules are again identified. There is left-sided hydronephrosis, progressed from previous exam. Normal appearance of the right kidney. The urinary bladder appears within normal limits. Stomach/Bowel: The stomach and the small bowel loops have a normal course and caliber. No bowel obstruction. No dilated loops of small or large bowel loops identified at this time. Vascular/Lymphatic: Calcified atherosclerotic disease involves the abdominal aorta. No aneurysm. Index portacaval node measures 1 cm, image 30 of series 5. Previously 1.3 cm. Gastrohepatic ligament lymph node measures 9 mm, image 29 of series 5. Previously 1.2 cm. The index left periaortic node measures 1.5 cm, image 38 of series 5. Previously 1.1 cm. Reproductive: The uterus and the adnexal structures are unremarkable. Other: Small fat containing hernias are identified at the previous ostomy site within the right lower quadrant ventral abdominal wall, image 51 of series 5. There is a small amount of adjacent fluid within the ventral abdominal wall measuring 2.7 by 0.9 cm, image 51 of series 5. No significant free fluid within the abdomen or pelvis and no fluid collections identified. Musculoskeletal: Spondylosis noted within the lumbar spine. No aggressive lytic or sclerotic bone lesions. IMPRESSION: 1. Interval progression of intrahepatic and common bile duct  dilatation. A stricture involving the distal common bile duct is suspected. Underlying mass lesion cannot be excluded. 2. Progressive dilatation of the left renal collecting system to the level of the UPJ. Findings are compatible with hydronephrosis. No obstructing stone or mass noted. 3.  Increase in size of left periaortic lymph node. Portacaval and gastrohepatic ligament lymph nodes have decreased in size in the interval. 4. New pulmonary nodules are identified within the lungs, suspicious for metastatic disease. Electronically Signed   By: Kerby Moors M.D.   On: 05/24/2015 13:11         Subjective: Patient is feeling better overall. Denies fevers, chills, chest pain, shortness of breath, diarrhea, vomiting. She has intermittent nausea. Abdominal pain overall is improving. She is passing flatus without having a bowel movement. She feels hungry and wants to eat.  Objective: Filed Vitals:   05/25/15 1502 05/25/15 1509 05/25/15 1555 05/25/15 1648  BP: 86/52 86/50 100/57 95/54  Pulse: 61 52 67 65  Temp:      TempSrc:      Resp:   18   Height:      Weight:      SpO2:   99% 97%   No intake or output data in the 24 hours ending 05/25/15 1747 Weight change: 8.392 kg (18 lb 8 oz) Exam:   General:  Pt is alert, follows commands appropriately, not in acute distress  HEENT: No icterus, No thrush, No neck mass, [place]/AT  Cardiovascular: IRRR, S1/S2, no rubs, no gallops  Respiratory: Bibasilar rales without wheezing  Abdomen: Soft/+BS, non tender, non distended, no guarding; no hepatosplenomegaly  Extremities: No edema, No lymphangitis, No petechiae, No rashes, no synovitis; no cyanosis  Data Reviewed: Basic Metabolic Panel:  Recent Labs Lab 05/23/15 2120 05/25/15 0427  NA 135 140  K 4.2 3.5  CL 102 113*  CO2 20* 19*  GLUCOSE 123* 123*  BUN 20 20  CREATININE 0.64 0.51  CALCIUM 8.9 8.0*   Liver Function Tests:  Recent Labs Lab 05/23/15 2120 05/25/15 0427  AST 20 19    ALT 20 20  ALKPHOS 80 68  BILITOT 0.9 0.3  PROT 6.0* 4.9*  ALBUMIN 2.5* 1.8*   No results for input(s): LIPASE, AMYLASE in the last 168 hours. No results for input(s): AMMONIA in the last 168 hours. CBC:  Recent Labs Lab 05/23/15 2120 05/24/15 0607 05/25/15 0427  WBC 22.6* 19.4* 14.7*  NEUTROABS 21.2*  --   --   HGB 13.3 12.7 10.0*  HCT 39.6 37.9 30.2*  MCV 86.3 86.7 87.8  PLT 245 246 229   Cardiac Enzymes:  Recent Labs Lab 05/24/15 0037 05/24/15 0607 05/24/15 1405  TROPONINI 0.45* 0.63* 0.59*   BNP: Invalid input(s): POCBNP CBG: No results for input(s): GLUCAP in the last 168 hours.  Recent Results (from the past 240 hour(s))  Culture, blood (Routine X 2) w Reflex to ID Panel     Status: None (Preliminary result)   Collection Time: 05/23/15  9:20 PM  Result Value Ref Range Status   Specimen Description BLOOD RIGHT HAND  Final   Special Requests IN PEDIATRIC BOTTLE [place]  Final   Culture  Setup Time   Final    GRAM POSITIVE COCCI IN CLUSTERS PEDIATRIC BOTTLE S. WEST,RN AT [id number] ON [id number] BY Rhea Bleacher    Culture   Final    STAPHYLOCOCCUS AUREUS Performed at [facility]    Report Status PENDING  Incomplete  Culture, blood (Routine X 2) w Reflex to ID Panel     Status: None (Preliminary result)   Collection Time: 05/23/15  9:35 PM  Result Value Ref Range Status   Specimen Description BLOOD LEFT FOREARM  Final   Special Requests BOTTLES DRAWN AEROBIC AND ANAEROBIC 5CC  Final   Culture  Setup Time   Final    GRAM POSITIVE COCCI IN CLUSTERS IN BOTH AEROBIC AND ANAEROBIC BOTTLES CRITICAL RESULT CALLED TO, READ BACK BY AND VERIFIED WITH: I BOULOUDENE,RN AT 1333 05/24/15 BY L BENFIELD    Culture   Final    STAPHYLOCOCCUS AUREUS Performed at [facility]    Report Status PENDING  Incomplete  Urine culture     Status: None (Preliminary result)   Collection Time: 05/23/15  9:55 PM  Result Value Ref Range Status   Specimen Description URINE,  CATHETERIZED  Final   Special Requests Normal  Final   Culture   Final    >=100,000 COLONIES/mL STAPHYLOCOCCUS AUREUS Performed at [facility]    Report Status PENDING  Incomplete  MRSA PCR Screening     Status: None   Collection Time: 05/24/15  7:02 PM  Result Value Ref Range Status   MRSA by PCR NEGATIVE NEGATIVE Final    Comment:        The GeneXpert MRSA Assay (FDA approved for NASAL specimens only), is one component of a comprehensive MRSA colonization surveillance program. It is not intended to diagnose MRSA infection nor to guide or monitor treatment for MRSA infections.      Scheduled Meds: . aspirin EC  81 mg Oral Daily  . [START ON 05/26/2015] dexamethasone  4 mg Oral Daily  . feeding supplement (ENSURE ENLIVE)  237 mL Oral TID BM  . prochlorperazine  2.5 mg Oral TID  . senna  1 tablet Oral BID  . vancomycin  750 mg Intravenous Q24H   Continuous Infusions: . sodium chloride 125 mL/hr at 05/25/15 1737     Shemica Meath, DO  [facility] Pager [phone]  If 7PM-7AM, please contact night-coverage [email or web address] Password [email or web address] 05/25/2015, 5:47 PM   LOS: 1 day

## 2015-05-25 NOTE — Telephone Encounter (Signed)
Left vm to inform patient of 3/31 appt with LT per 3/17 pof

## 2015-05-25 NOTE — Clinical Documentation Improvement (Signed)
Internal Medicine  Can the diagnosis of encephalopathy be further specified on progress notes and discharge summary?   Metabolic encephalopathy  Other  Clinically Undetermined  Document any associated diagnoses/conditions.   Supporting Information: 82 year old female admitted with Sepsis d/t UTI and NSTEMI Chief complaint altered mental status, Fever, & UTI  ED provider note: presents to the ER with AMS and was last known normal at 10 am when she called her son and complained of CP. The patient's son later saw her at 9 PM when he was done with work and she had altered mental status and dysphagia, which is new. States that she resides at [place] is noted to have mental status, fever and strong smell suspicious for UTI. Alert, disoriented Withdrawal to pain, moves all extremities Will not follow commands Please exercise your independent, professional judgment when responding. A specific answer is not anticipated or expected.  H&P Neuro: Somnolent, arousable, no gross facial asymmetry or asymmetric tone; PERRL, Babinski down-going bilaterally.  Psych: Patient is not overtly psychotic, though difficult to assess given clinical scenario.  3/16 progress note Patient is pleasantly confused Acute encephalopathy -Secondary to infectious process -At baseline, the patient is able to converse and clarify her needs  Treatments IV antibiotics: Vanc and cefepime    Thank You,  Dulac [phone]

## 2015-05-25 NOTE — Progress Notes (Signed)
  Echocardiogram 2D Echocardiogram has been performed.  [name] 05/25/2015, 9:49 AM

## 2015-05-25 NOTE — Progress Notes (Signed)
CSW dept following due to patient being admitted from nursing facility. Daughter contacted via phone, unable to be reached.  Patient is from [facility] and can return per facility, but there is no bed hold.  Patient needs a PT consult if possible due to insurance and insurance currently paying for SNF.  Will complete assessment once daughter returns call.  Lane Hacker, MSW Clinical Social Work: Emergency Room [phone]

## 2015-05-25 NOTE — Progress Notes (Signed)
MD Tat paged about patient's HR dropping to 28 in A-Fib, patient then having a 6-8 second run of asystole. Patient stated she "felt funny" during asystole event but then "felt better." VSS. Currently still in A-Fib with rate from 40s-60s. Awaiting response.

## 2015-05-25 NOTE — Progress Notes (Signed)
Paged Dr. Carles Collet about patient having a 6.08 sec pause. Dr. Carles Collet asked if we could notify cardiology. Paged Dr. Cathie Olden. Waiting for call back. Patient VSS. HR currently between 60-75bpm. Will continue to monitor

## 2015-05-26 DIAGNOSIS — I4891 Unspecified atrial fibrillation: Secondary | ICD-10-CM

## 2015-05-26 LAB — BASIC METABOLIC PANEL
Anion gap: 13 (ref 5–15)
BUN: 19 mg/dL (ref 6–20)
CHLORIDE: 115 mmol/L — AB (ref 101–111)
CO2: 15 mmol/L — AB (ref 22–32)
CREATININE: 0.45 mg/dL (ref 0.44–1.00)
Calcium: 8.6 mg/dL — ABNORMAL LOW (ref 8.9–10.3)
GFR calc Af Amer: >60 mL/min (ref >60)
GFR calc non Af Amer: >60 mL/min (ref >60)
GLUCOSE: 118 mg/dL — AB (ref 65–99)
POTASSIUM: 4.6 mmol/L (ref 3.5–5.1)
Sodium: 143 mmol/L (ref 135–145)

## 2015-05-26 LAB — CULTURE, BLOOD (ROUTINE X 2)

## 2015-05-26 LAB — CBC
HCT: 33.3 % — ABNORMAL LOW (ref 36.0–46.0)
HEMOGLOBIN: 10.8 g/dL — AB (ref 12.0–15.0)
MCH: 28.9 pg (ref 26.0–34.0)
MCHC: 32.4 g/dL (ref 30.0–36.0)
MCV: 89 fL (ref 78.0–100.0)
Platelets: 273 10*3/uL (ref 150–400)
RBC: 3.74 MIL/uL — AB (ref 3.87–5.11)
RDW: 17.5 % — ABNORMAL HIGH (ref 11.5–15.5)
WBC: 14.3 10*3/uL — ABNORMAL HIGH (ref 4.0–10.5)

## 2015-05-26 LAB — URINE CULTURE
Culture: >=100000
SPECIAL REQUESTS: NORMAL

## 2015-05-26 LAB — MAGNESIUM: MAGNESIUM: 1.9 mg/dL (ref 1.7–2.4)

## 2015-05-26 MED ORDER — METOPROLOL TARTRATE 25 MG PO TABS
12.5000 mg | ORAL_TABLET | Freq: Four times a day (QID) | ORAL | Status: DC
  Administered 2015-05-26 (×3): 12.5 mg via ORAL
  Filled 2015-05-26 (×3): qty 1

## 2015-05-26 MED ORDER — POTASSIUM CHLORIDE IN NACL 20-0.9 MEQ/L-% IV SOLN
INTRAVENOUS | Status: DC
  Administered 2015-05-26 – 2015-05-27 (×3): via INTRAVENOUS
  Filled 2015-05-26 (×3): qty 1000

## 2015-05-26 MED ORDER — METOPROLOL TARTRATE 25 MG PO TABS
12.5000 mg | ORAL_TABLET | Freq: Two times a day (BID) | ORAL | Status: DC
  Administered 2015-05-26: 12.5 mg via ORAL
  Filled 2015-05-26: qty 1

## 2015-05-26 NOTE — Progress Notes (Signed)
PT Cancellation Note  Patient Details Name: [name] MRN: [id number] DOB: 09-03-1924   Cancelled Treatment:    Reason Eval/Treat Not Completed: Patient declined, no reason specified. Will check back another day.    Weston Anna, MPT Pager: [phone]

## 2015-05-26 NOTE — Progress Notes (Signed)
PROGRESS NOTE  [name] [id number] DOB: 25-Feb-1925 DOA: 05/23/2015 PCP:  Melinda Crutch, MD  Brief History 82 y.o. 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 At baseline, the patient has mild cognitive deficit, but is able to express her needs and recognize her family. However, on the morning of admission, she reportedly called her son at approximately 10 AM to report crushing chest pain. He reportedly informed SNF staff of this complaint. When he went to visit her later in the afternoon, she was unable to recognize him and unable to give any information about her chest pain or other symptoms. She was noted to be febrile to 100.32F and a foul odor to the urine was also reported. EMS was activated and she was transported to the hospital for evaluation of acute confusional state and fever. In the emergency department, the patient was noted to have elevated troponin up to 0.63, WBC 22.6, lactic acid 2.27. The patient was started on vancomycin and cefepime and given 1.5 L bolus of normal saline. She was also started on a heparin drip due to concerns for possible NSTEMI. The patient was recently discharged to [facility] from the hospital on 05/11/2015 after suffering hemiparesis secondary to brain metastasis from her colon cancer.  Assessment/Plan: Sepsis secondary to UTI and Bacteremia--MSSA - Meets sepsis criteria on admission with fever, tachycardia, leukocytosis, elevated lactate, grossly positive UA  - Blood and urine cultures--MSSA -abx per ID -Chest x-ray negative for infiltrates Afib with RVR -05/25/15--echo EF 55-60%, moderate AI, HK of basalinferior myocardium -?due to PE-->CTA chest with elevated D-Dimer -pt became bradycardic with diltiazem drip-->d/c -initially placed on metoprolol--hold  due to bradycardia now with 6 second pause -cardiology following -05/26/2015--patient developed RVR 120s--restart her metoprolol low-dose Toprol 5 mg twice a day -Not an anticoagulation candidate secondary to brain metastasis and overall poor prognosis Elevated troponin/Chest pain -Likely demand ischemia -No chest pain presently -EKG--sinus rhythm with nonspecific T-wave inversion -AppreciateCardiology consult -05/26/2015--CTA chest negative for PE, bilateral small pleural effusions Left Hydronephrosis -suspect this may be due to her adenopathy -renal function remains preserved -consulted urology--discussed with Dr. Alyce Pagan pt has preserved renal function and pt is NOT to receive any further chemo or radiation from oncologic standpoint, performing surgery and placing another foreign body in the setting of sepsis is not indicated at this time Acute encephalopathy -Secondary to infectious process -At baseline, the patient is able to converse and clarify her needs -improved, back to baseline Abdominal pain -CT abdomen in setting of sepsis--Interval progression of intrahepatic and common bile duct dilatation. A stricture involving the distal common bile duct is Suspected. Progressive L-hydronephrosis -abd pain overall improving stage 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 See problem #1.  -On need to follow-up on duodenal biopsy pathology results from [facility]. Per oncology. -Minimal CBD obstruction and duodenal partial obstruction-- biopsies from [facility]-->showed adenocarcinoma from [facility] -Appreciate Dr. Ashok Cordia consult--case discussed with him Left hemiparesis secondary to metastatic colon cancer -metastatic disease noted on MRI head which shows a 19 mm high right frontoparietal mass consistent with solitary metastases. Moderate vasogenic edema without shift.   -Continue po Decadron -pt had one SRS fraction on 05/11/15 -pathology report from duodenal biopsy at [facility] adenocarcinoma  Hypertension -Resume home dose Norvasc if BP climbs  -Hydralazine as needed. -discontinue losartan HCTZ-->BP remains stable -HCTZ contributing to hyponatremia hyponatremia - likely due to volume depletion from poor oral intake -IV fluids-->improved Gapped metabolic acidosis -trend -lactic acid  Family Communication: Son updated at beside 05/25/15 Disposition Plan: SNF 2-3 days    Procedures/Studies: Dg Chest 2 View  05/23/2015  CLINICAL DATA:  Altered mental status, weakness and fever. History of colon cancer. EXAM: CHEST  2 VIEW COMPARISON:  Chest x-ray dated 07/06/2013. FINDINGS: Mild cardiomegaly is stable. Atherosclerotic changes again noted at the aortic arch. Vague opacity at the left costophrenic angle is grossly stable, most compatible with chronic atelectasis or chronic pleural thickening. Lungs are otherwise clear. No evidence of pneumonia. Mild degenerative change again noted within the thoracic spine. Degenerative changes also noted at each shoulder. No acute- appearing osseous abnormality. IMPRESSION: No active cardiopulmonary disease. Electronically Signed   By: Franki Cabot M.D.   On: 05/23/2015 21:41   Ct Head Wo Contrast  05/23/2015  CLINICAL DATA:  Acute onset of altered level of consciousness. Fever and malodorous urine. Nausea and vomiting. Initial encounter. EXAM: CT HEAD WITHOUT CONTRAST TECHNIQUE: Contiguous axial images were obtained from the base of the skull through the vertex without intravenous contrast. COMPARISON:  MRI of the brain performed 05/10/2015 FINDINGS: A 1.9 cm right parasagittal mass is again noted at the convexity, with surrounding decreased attenuation within the white matter. No midline shift is seen. Mild periventricular white matter change likely reflects small vessel ischemic microangiopathy. There is  no evidence of acute intra or extra-axial hemorrhage. There is no evidence for acute infarct. The brainstem and fourth ventricle are within normal limits. The basal ganglia are unremarkable in appearance. The cerebral hemispheres demonstrate grossly normal gray-white differentiation. No mass effect or midline shift is seen. There is no evidence of fracture; visualized osseous structures are unremarkable in appearance. The orbits are within normal limits. The paranasal sinuses and mastoid air cells are well-aerated. No significant soft tissue abnormalities are seen. IMPRESSION: 1. 1.9 cm right parasagittal mass at the convexity, with surrounding decreased attenuation within the white matter. No midline shift seen. 2. Mild small vessel ischemic microangiopathy. Electronically Signed   By: Garald Balding M.D.   On: 05/23/2015 21:58   Ct Angio Chest Pe W/cm &/or Wo Cm  05/25/2015  CLINICAL DATA:  Elevated D-dimer. Metastatic colon cancer. New atrial fibrillation. EXAM: CT ANGIOGRAPHY CHEST WITH CONTRAST TECHNIQUE: Multidetector CT imaging of the chest was performed using the standard protocol during bolus administration of intravenous contrast. Multiplanar CT image reconstructions and MIPs were obtained to evaluate the vascular anatomy. CONTRAST:  153mL OMNIPAQUE IOHEXOL 350 MG/ML SOLN COMPARISON:  Mid chest radiographs dated 05/23/2015 and chest CT dated 03/30/2015. FINDINGS: Mediastinum/Lymph Nodes: No pulmonary emboli or thoracic aortic dissection identified. No masses or pathologically enlarged lymph nodes identified. Lungs/Pleura: Multiple bilateral lung nodules. The largest is a 10 mm upper lobe nodule on image number 25 of series 7. There are multiple additional smaller nodules throughout both lungs. There are also small bilateral pleural effusions and mild bilateral lower lobe atelectasis. Upper abdomen: Progressive biliary ductal dilatation. The left hepatic duct currently measures 14 mm in diameter. This  previously measured 6 mm. Musculoskeletal: Marked right shoulder degenerative changes and incompletely included left shoulder degenerative changes. Extensive, large calcified loose bodies are again demonstrated inferior and medial to the right shoulder. No evidence of bony metastatic disease. Review of the MIP images confirms the above findings.  IMPRESSION: 1. No pulmonary emboli. 2. Small bilateral pleural effusions. 3. Mild bilateral lower lobe atelectasis. 4. Multiple bilateral lung nodules compatible with lung metastases. 5. Biliary obstruction with a significant interval increase in size of the intrahepatic ducts. This suggest at the pancreatic head abnormality seen on the previous CTA may have represented a pancreatic neoplasm. This is not included on the current examination. Electronically Signed   By: Claudie Revering M.D.   On: 05/25/2015 18:53   Mr [name] [id number] Contrast  05/10/2015  CLINICAL DATA:  Colon cancer. LEFT hemiparesis, leg greater than arm. Confusion. SRS planning EXAM: MRI HEAD WITHOUT AND WITH CONTRAST TECHNIQUE: Multiplanar, multiecho pulse sequences of the brain and surrounding structures were obtained without and with intravenous contrast. CONTRAST:  72mL MULTIHANCE GADOBENATE DIMEGLUMINE 529 MG/ML IV SOLN COMPARISON:  1.5 tesla MR 05/08/2015. FINDINGS: Redemonstrated is 14 x 18 x 18 mm (R-L x A-P x C-C) superficial, but intra-axial mass, RIGHT posterior frontal cortex, parasagittal location with surrounding vasogenic edema. There is avid postcontrast enhancement. Marked surrounding vasogenic edema with no midline shift but slight depression of the RIGHT lateral ventricle. No additional brain lesions. No osseous lesions. Extracranial soft tissues unremarkable. No significant change from previous exam. IMPRESSION: Superficial, but intra-axial, RIGHT posterior frontal parasagittal metastasis. This appears to be a solitary lesion, approximately 2 cm. Extensive vasogenic edema but no midline shift.  Electronically Signed   By: Staci Righter M.D.   On: 05/10/2015 15:32   Mr [name] [id number] Contrast  05/08/2015  CLINICAL DATA:  Left hemi paresis. History of colon cancer. Metastasis versus infarct. EXAM: MRI HEAD WITHOUT AND WITH CONTRAST TECHNIQUE: Multiplanar, multiecho pulse sequences of the brain and surrounding structures were obtained without and with intravenous contrast. CONTRAST:  10 cc MultiHance intravenous COMPARISON:  None. FINDINGS: Calvarium and upper cervical spine: No definitive skeletal metastasis. Orbits: Bilateral cataract resection.  Negative for mass Sinuses and Mastoids: Clear. Brain: There is an enhancing intra-axial mass in the parasagittal right frontal parietal lobe along the upper motor strip measuring 19 mm. Moderate vasogenic edema without shift or herniation. No other masses identified. MR signal shows dense cellularity. No definitive hemorrhagic component. No acute infarct, hemorrhage, hydrocephalus, or major vessel occlusion. Metastasis is unexpected finding, but to accelerate care these results will be called to the ordering clinician or representative by the Radiologist Assistant, and communication documented in the PACS or zVision Dashboard. IMPRESSION: 19 mm high right frontoparietal mass consistent with solitary metastasis. Moderate vasogenic edema without shift. Electronically Signed   By: Monte Fantasia M.D.   On: 05/08/2015 11:40   Ct Abdomen Pelvis W Contrast  05/24/2015  CLINICAL DATA:  Abdominal pain.  Sepsis.  History of colon cancer. EXAM: CT ABDOMEN AND PELVIS WITH CONTRAST TECHNIQUE: Multidetector CT imaging of the abdomen and pelvis was performed using the standard protocol following bolus administration of intravenous contrast. CONTRAST:  80mL OMNIPAQUE IOHEXOL 300 MG/ML SOLN, 67mL OMNIPAQUE IOHEXOL 300 MG/ML SOLN COMPARISON:  03/30/2015 FINDINGS: Lower chest: Small pleural effusions are noted left greater night. There is overlying compressive type atelectasis  and consolidation involving the left lower lobe. Nodule in the lingula is identified measuring 7 mm, image number 1 of series 4. New from previous exam. New right lower lobe pulmonary nodule is identified measuring 4 mm, image 12 of series 4. Also new from previous exam is a lingular nodule measuring 4 mm, image 2 of series 4. Hepatobiliary: No focal liver abnormality identified. There has been interval progression of intrahepatic  bile duct dilatation. The common bile duct measures 1.6 cm, image number 39 of series 6. On the previous exam this measured 0.9 cm. Distal common bile duct stricture is suspected, image 39 of series 6. Pancreas: Mild diffuse edema of the pancreas. The appearance is similar to previous exam. Spleen: Calcified granulomas identified within the spleen. Adrenals/Urinary Tract: Scratch set bilateral adrenal nodules are again identified. There is left-sided hydronephrosis, progressed from previous exam. Normal appearance of the right kidney. The urinary bladder appears within normal limits. Stomach/Bowel: The stomach and the small bowel loops have a normal course and caliber. No bowel obstruction. No dilated loops of small or large bowel loops identified at this time. Vascular/Lymphatic: Calcified atherosclerotic disease involves the abdominal aorta. No aneurysm. Index portacaval node measures 1 cm, image 30 of series 5. Previously 1.3 cm. Gastrohepatic ligament lymph node measures 9 mm, image 29 of series 5. Previously 1.2 cm. The index left periaortic node measures 1.5 cm, image 38 of series 5. Previously 1.1 cm. Reproductive: The uterus and the adnexal structures are unremarkable. Other: Small fat containing hernias are identified at the previous ostomy site within the right lower quadrant ventral abdominal wall, image 51 of series 5. There is a small amount of adjacent fluid within the ventral abdominal wall measuring 2.7 by 0.9 cm, image 51 of series 5. No significant free fluid within the  abdomen or pelvis and no fluid collections identified. Musculoskeletal: Spondylosis noted within the lumbar spine. No aggressive lytic or sclerotic bone lesions. IMPRESSION: 1. Interval progression of intrahepatic and common bile duct dilatation. A stricture involving the distal common bile duct is suspected. Underlying mass lesion cannot be excluded. 2. Progressive dilatation of the left renal collecting system to the level of the UPJ. Findings are compatible with hydronephrosis. No obstructing stone or mass noted. 3. Increase in size of left periaortic lymph node. Portacaval and gastrohepatic ligament lymph nodes have decreased in size in the interval. 4. New pulmonary nodules are identified within the lungs, suspicious for metastatic disease. Electronically Signed   By: Kerby Moors M.D.   On: 05/24/2015 13:11         Subjective: Patient is feeling better this morning. However she complains of having poor taste in her mouth resulting in poor oral intake. She denies any fevers, chills, chest pain, vomiting, diarrhea, dysuria, hematuria. She has chronic epigastric pain.  Objective: Filed Vitals:   05/26/15 0216 05/26/15 0500 05/26/15 0700 05/26/15 0821  BP: 122/79  145/78 129/79  Pulse: 101  117 106  Temp: 98.4 F (36.9 C)  98.4 F (36.9 C)   TempSrc: Oral  Oral   Resp: 18  18 20   Height:      Weight:  57.153 kg (126 lb)    SpO2: 100%  100% 100%    Intake/Output Summary (Last 24 hours) at 05/26/15 1252 Last data filed at 05/26/15 0701  Gross per 24 hour  Intake      0 ml  Output    900 ml  Net   -900 ml   Weight change: 4.309 kg (9 lb 8 oz) Exam:   General:  Pt is alert, follows commands appropriately, not in acute distress  HEENT: No icterus, No thrush, No neck mass, [place]/AT  Cardiovascular: IRRR, S1/S2, no rubs, no gallops  Respiratory: Bibasilar crackles but no wheezing. Good air movement.  Abdomen: Soft/+BS, periumbilical and epigastric tenderness without any  rebound, non distended, no guarding  Extremities: No edema, No lymphangitis, No petechiae, No rashes,  no synovitis  Data Reviewed: Basic Metabolic Panel:  Recent Labs Lab 05/23/15 2120 05/25/15 0427 05/26/15 0539  NA 135 140 143  K 4.2 3.5 4.6  CL 102 113* 115*  CO2 20* 19* 15*  GLUCOSE 123* 123* 118*  BUN 20 20 19   CREATININE 0.64 0.51 0.45  CALCIUM 8.9 8.0* 8.6*  MG  --   --  1.9   Liver Function Tests:  Recent Labs Lab 05/23/15 2120 05/25/15 0427  AST 20 19  ALT 20 20  ALKPHOS 80 68  BILITOT 0.9 0.3  PROT 6.0* 4.9*  ALBUMIN 2.5* 1.8*   No results for input(s): LIPASE, AMYLASE in the last 168 hours. No results for input(s): AMMONIA in the last 168 hours. CBC:  Recent Labs Lab 05/23/15 2120 05/24/15 0607 05/25/15 0427 05/26/15 0539  WBC 22.6* 19.4* 14.7* 14.3*  NEUTROABS 21.2*  --   --   --   HGB 13.3 12.7 10.0* 10.8*  HCT 39.6 37.9 30.2* 33.3*  MCV 86.3 86.7 87.8 89.0  PLT 245 246 229 273   Cardiac Enzymes:  Recent Labs Lab 05/24/15 0037 05/24/15 0607 05/24/15 1405  TROPONINI 0.45* 0.63* 0.59*   BNP: Invalid input(s): POCBNP CBG: No results for input(s): GLUCAP in the last 168 hours.  Recent Results (from the past 240 hour(s))  Culture, blood (Routine X 2) w Reflex to ID Panel     Status: None   Collection Time: 05/23/15  9:20 PM  Result Value Ref Range Status   Specimen Description BLOOD RIGHT HAND  Final   Special Requests IN PEDIATRIC BOTTLE 2CC  Final   Culture  Setup Time   Final    GRAM POSITIVE COCCI IN CLUSTERS PEDIATRIC BOTTLE S. WEST,RN AT 1451 ON [id number] BY Rhea Bleacher    Culture   Final    STAPHYLOCOCCUS AUREUS SUSCEPTIBILITIES PERFORMED ON PREVIOUS CULTURE WITHIN THE LAST 5 DAYS. Performed at [facility]    Report Status 05/26/2015 FINAL  Final  Culture, blood (Routine X 2) w Reflex to ID Panel     Status: None   Collection Time: 05/23/15  9:35 PM  Result Value Ref Range Status   Specimen Description BLOOD  LEFT FOREARM  Final   Special Requests BOTTLES DRAWN AEROBIC AND ANAEROBIC 5CC  Final   Culture  Setup Time   Final    GRAM POSITIVE COCCI IN CLUSTERS IN BOTH AEROBIC AND ANAEROBIC BOTTLES CRITICAL RESULT CALLED TO, READ BACK BY AND VERIFIED WITH: I BOULOUDENE,RN AT [id number] 05/24/15 BY L BENFIELD    Culture   Final    STAPHYLOCOCCUS AUREUS Performed at [facility]    Report Status 05/26/2015 FINAL  Final   Organism ID, Bacteria STAPHYLOCOCCUS AUREUS  Final      Susceptibility   Staphylococcus aureus - MIC*    CIPROFLOXACIN <=0.5 SENSITIVE Sensitive     ERYTHROMYCIN 0.5 SENSITIVE Sensitive     GENTAMICIN <=0.5 SENSITIVE Sensitive     OXACILLIN 0.5 SENSITIVE Sensitive     TETRACYCLINE <=1 SENSITIVE Sensitive     VANCOMYCIN 1 SENSITIVE Sensitive     TRIMETH/SULFA <=10 SENSITIVE Sensitive     CLINDAMYCIN <=0.25 SENSITIVE Sensitive     RIFAMPIN <=0.5 SENSITIVE Sensitive     Inducible Clindamycin NEGATIVE Sensitive     * STAPHYLOCOCCUS AUREUS  Urine culture     Status: None   Collection Time: 05/23/15  9:55 PM  Result Value Ref Range Status   Specimen Description URINE, CATHETERIZED  Final  Special Requests Normal  Final   Culture   Final    >=100,000 COLONIES/mL STAPHYLOCOCCUS AUREUS Performed at [facility]    Report Status 05/26/2015 FINAL  Final   Organism ID, Bacteria STAPHYLOCOCCUS AUREUS  Final      Susceptibility   Staphylococcus aureus - MIC*    CIPROFLOXACIN <=0.5 SENSITIVE Sensitive     GENTAMICIN <=0.5 SENSITIVE Sensitive     NITROFURANTOIN <=16 SENSITIVE Sensitive     OXACILLIN 0.5 SENSITIVE Sensitive     TETRACYCLINE <=1 SENSITIVE Sensitive     VANCOMYCIN 1 SENSITIVE Sensitive     TRIMETH/SULFA <=10 SENSITIVE Sensitive     CLINDAMYCIN <=0.25 SENSITIVE Sensitive     RIFAMPIN <=0.5 SENSITIVE Sensitive     Inducible Clindamycin NEGATIVE Sensitive     * >=100,000 COLONIES/mL STAPHYLOCOCCUS AUREUS  MRSA PCR Screening     Status: None   Collection  Time: 05/24/15  7:02 PM  Result Value Ref Range Status   MRSA by PCR NEGATIVE NEGATIVE Final    Comment:        The GeneXpert MRSA Assay (FDA approved for NASAL specimens only), is one component of a comprehensive MRSA colonization surveillance program. It is not intended to diagnose MRSA infection nor to guide or monitor treatment for MRSA infections.      Scheduled Meds: . aspirin EC  81 mg Oral Daily  . dexamethasone  4 mg Oral Daily  . feeding supplement (ENSURE ENLIVE)  237 mL Oral TID BM  . metoprolol tartrate  12.5 mg Oral QID  . prochlorperazine  2.5 mg Oral TID  . senna  1 tablet Oral BID  . vancomycin  750 mg Intravenous Q24H   Continuous Infusions: . 0.9 % NaCl with KCl 20 mEq / L 75 mL/hr at 05/26/15 0749     Crystal Trinka, DO  [facility] Pager [phone]  If 7PM-7AM, please contact night-coverage [email or web address] Password [email or web address] 05/26/2015, 12:52 PM   LOS: 2 days

## 2015-05-26 NOTE — Progress Notes (Signed)
Per tele, patient had RVR with HR in 140s. Patient asymptomatic, VSS. MD paged to be made aware- also made aware HR is trending up towards upper 120s-140s. Current HR is A-Fib at 133.

## 2015-05-26 NOTE — Progress Notes (Signed)
Patient HR continue  to trend between 120-140, Asymptomatic. In bed resting quietly at this time. MD on call made aware.

## 2015-05-26 NOTE — Progress Notes (Addendum)
CRITICAL VALUE ALERT  Critical value received: Both sets of blood cultures growing gram + cocci in clusters  Date of notification: 05/26/15  Time of notification:  [id number]  Critical value read back:Yes.    Nurse who received alert:  Willene Hatchet, RN  MD notified (1st page): MD Tat  Time of first page:  [phone]  MD notified (2nd page):  Time of second page:  Responding MD:  MD Tat  Time MD responded:  [phone]

## 2015-05-26 NOTE — Progress Notes (Signed)
MD Tat paged and made aware of patient's steadily increasing HR. Continues to be in A-Fib with HR in 110s-120s. HR increased to 135 briefly but then decreased back to 110s-120s. VSS. Patient c/o "I can't exhale all the way. I just feel jumpy." Denies chest pain and pressure. O2 Sats stable on 2L/[place], denies SOB. Continuous pulse ox continues. Awaiting new orders.

## 2015-05-26 NOTE — Progress Notes (Signed)
    SUBJECTIVE:  No pain.  Feels like she cannot get a deep breath.   PHYSICAL EXAM Filed Vitals:   05/26/15 0216 05/26/15 0500 05/26/15 0700 05/26/15 0821  BP: 122/79  145/78 129/79  Pulse: 101  117 106  Temp: 98.4 F (36.9 C)  98.4 F (36.9 C)   TempSrc: Oral  Oral   Resp: 18  18 20   Height:      Weight:  126 lb (57.153 kg)    SpO2: 100%  100% 100%   General:  No distress Lungs:  Clear  Heart:  Irregular  Abdomen:  Positive bowel sounds, no rebound no guarding Extremities:  No edema   LABS: Lab Results  Component Value Date   TROPONINI 0.59* 05/24/2015   Results for orders placed or performed during the hospital encounter of 05/23/15 (from the past 24 hour(s))  D-dimer, quantitative (not at [facility])     Status: Abnormal   Collection Time: 05/25/15 12:32 PM  Result Value Ref Range   D-Dimer, Quant 2.32 (H) 0.00 - 0.50 ug/mL-FEU  CBC     Status: Abnormal   Collection Time: 05/26/15  5:39 AM  Result Value Ref Range   WBC 14.3 (H) 4.0 - 10.5 K/uL   RBC 3.74 (L) 3.87 - 5.11 MIL/uL   Hemoglobin 10.8 (L) 12.0 - 15.0 g/dL   HCT 33.3 (L) 36.0 - 46.0 %   MCV 89.0 78.0 - 100.0 fL   MCH 28.9 26.0 - 34.0 pg   MCHC 32.4 30.0 - 36.0 g/dL   RDW 17.5 (H) 11.5 - 15.5 %   Platelets 273 150 - 400 K/uL  Basic metabolic panel     Status: Abnormal   Collection Time: 05/26/15  5:39 AM  Result Value Ref Range   Sodium 143 135 - 145 mmol/L   Potassium 4.6 3.5 - 5.1 mmol/L   Chloride 115 (H) 101 - 111 mmol/L   CO2 15 (L) 22 - 32 mmol/L   Glucose, Bld 118 (H) 65 - 99 mg/dL   BUN 19 6 - 20 mg/dL   Creatinine, Ser 0.45 0.44 - 1.00 mg/dL   Calcium 8.6 (L) 8.9 - 10.3 mg/dL   GFR calc non Af Amer >60 >60 mL/min   GFR calc Af Amer >60 >60 mL/min   Anion gap 13 5 - 15  Magnesium     Status: None   Collection Time: 05/26/15  5:39 AM  Result Value Ref Range   Magnesium 1.9 1.7 - 2.4 mg/dL    Intake/Output Summary (Last 24 hours) at 05/26/15 0941 Last data filed at 05/26/15 0701  Gross  per 24 hour  Intake      0 ml  Output    900 ml  Net   -900 ml     ASSESSMENT AND PLAN:  ATRIAL FIB:   Nursing reports heart rates in the 20s.  These should be scanned into Epic.  I did not find these in telemetry.  Currently fib in the 120s.  We will have to accept this tachy rate given the brady rate.  No IV dilt.    DEMAND ISCHEMIA:  Not an NSTEMI.   No further work up given severe comorbidities.      Jeneen Rinks [facility] 05/26/2015 9:41 AM

## 2015-05-27 LAB — CBC
HEMATOCRIT: 34.1 % — AB (ref 36.0–46.0)
HEMOGLOBIN: 11.1 g/dL — AB (ref 12.0–15.0)
MCH: 29.1 pg (ref 26.0–34.0)
MCHC: 32.6 g/dL (ref 30.0–36.0)
MCV: 89.5 fL (ref 78.0–100.0)
Platelets: 336 10*3/uL (ref 150–400)
RBC: 3.81 MIL/uL — ABNORMAL LOW (ref 3.87–5.11)
RDW: 17.5 % — AB (ref 11.5–15.5)
WBC: 16.4 10*3/uL — ABNORMAL HIGH (ref 4.0–10.5)

## 2015-05-27 LAB — BASIC METABOLIC PANEL
Anion gap: 11 (ref 5–15)
BUN: 24 mg/dL — ABNORMAL HIGH (ref 6–20)
CALCIUM: 8.9 mg/dL (ref 8.9–10.3)
CHLORIDE: 117 mmol/L — AB (ref 101–111)
CO2: 15 mmol/L — AB (ref 22–32)
CREATININE: 0.52 mg/dL (ref 0.44–1.00)
GFR calc non Af Amer: >60 mL/min (ref >60)
GLUCOSE: 131 mg/dL — AB (ref 65–99)
Potassium: 5.1 mmol/L (ref 3.5–5.1)
Sodium: 143 mmol/L (ref 135–145)

## 2015-05-27 LAB — MAGNESIUM: Magnesium: 2.1 mg/dL (ref 1.7–2.4)

## 2015-05-27 MED ORDER — SODIUM CHLORIDE 0.9 % IV SOLN
INTRAVENOUS | Status: DC
  Administered 2015-05-27 – 2015-05-29 (×4): via INTRAVENOUS

## 2015-05-27 MED ORDER — METOPROLOL TARTRATE 1 MG/ML IV SOLN
5.0000 mg | Freq: Once | INTRAVENOUS | Status: AC
  Administered 2015-05-27: 5 mg via INTRAVENOUS
  Filled 2015-05-27: qty 5

## 2015-05-27 MED ORDER — CEFAZOLIN SODIUM-DEXTROSE 2-3 GM-% IV SOLR
2.0000 g | Freq: Three times a day (TID) | INTRAVENOUS | Status: DC
  Administered 2015-05-27 – 2015-05-29 (×7): 2 g via INTRAVENOUS
  Filled 2015-05-27 (×8): qty 50

## 2015-05-27 MED ORDER — METOPROLOL TARTRATE 25 MG PO TABS: 25.0000 mg | ORAL_TABLET | Freq: Two times a day (BID) | ORAL | Status: DC

## 2015-05-27 MED ORDER — METOPROLOL TARTRATE 25 MG PO TABS
25.0000 mg | ORAL_TABLET | Freq: Four times a day (QID) | ORAL | Status: DC
  Administered 2015-05-27 – 2015-05-29 (×9): 25 mg via ORAL
  Filled 2015-05-27 (×9): qty 1

## 2015-05-27 MED ORDER — SODIUM CHLORIDE 0.9 % IV SOLN
INTRAVENOUS | Status: DC
Start: 2015-05-27 — End: 2015-05-27
  Filled 2015-05-27: qty 1000

## 2015-05-27 NOTE — Progress Notes (Signed)
PROGRESS NOTE  [name] [id number] DOB: 1924-11-05 DOA: 05/23/2015 PCP:  Melinda Crutch, MD   Brief History 82 y.o. 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 At baseline, the patient has mild cognitive deficit, but is able to express her needs and recognize her family. However, on the morning of admission, she reportedly called her son at approximately 10 AM to report crushing chest pain. He reportedly informed SNF staff of this complaint. When he went to visit her later in the afternoon, she was unable to recognize him and unable to give any information about her chest pain or other symptoms. She was noted to be febrile to 100.63F and a foul odor to the urine was also reported. EMS was activated and she was transported to the hospital for evaluation of acute confusional state and fever. In the emergency department, the patient was noted to have elevated troponin up to 0.63, WBC 22.6, lactic acid 2.27. The patient was started on vancomycin and cefepime and given 1.5 L bolus of normal saline. She was also started on a heparin drip due to concerns for possible NSTEMI. The patient was recently discharged to [facility] from the hospital on 05/11/2015 after suffering hemiparesis secondary to brain metastasis from her colon cancer.  Assessment/Plan: Sepsis secondary to UTI and Bacteremia--MSSA - Meets sepsis criteria on admission with fever, tachycardia, leukocytosis, elevated lactate, grossly positive UA  - Blood and urine cultures--MSSA - pt does not have any foreign bodies, no recent PICC or port -abx per ID -05/28/15--d/c vanco, start cefazolin -3/15 and 3/17 blood cultures positive -Chest x-ray negative for infiltrates Afib with RVR -05/25/15--echo EF 55-60%, moderate AI, HK of basalinferior  myocardium -?due to PE-->CTA chest with elevated D-Dimer--neg for PE -pt became bradycardic with diltiazem drip-->d/c -05/25/15--initially placed on metoprolol--hold due to bradycardia now with 6 second pause -cardiology following -05/26/2015--patient developed RVR 120s--restart her metoprolol--increased to 25 mg q 6 hrs -Not an anticoagulation candidate secondary to brain metastasis and overall poor prognosis Elevated troponin/Chest pain -Likely demand ischemia -No chest pain presently -EKG--sinus rhythm with nonspecific T-wave inversion -AppreciateCardiology consult -05/26/2015--CTA chest negative for PE, bilateral small pleural effusions Left Hydronephrosis -suspect this may be due to her adenopathy -renal function remains preserved -consulted urology--discussed with Dr. Alyce Pagan pt has preserved renal function and pt is NOT to receive any further chemo or radiation from oncologic standpoint, performing surgery and placing another foreign body in the setting of sepsis is not indicated at this time Acute metabolic encephalopathy -Secondary to infectious process -At baseline, the patient is able to converse and clarify her needs -improved, back to baseline Abdominal pain -CT abdomen in setting of sepsis--Interval progression of intrahepatic and common bile duct dilatation. A stricture involving the distal common bile duct is Suspected. Progressive L-hydronephrosis -abd pain overall improving -Biliary stricture is chronic and likely due to carcinomatosis--no further intervention at this time secondary to poor prognosis stage 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 See problem #1.  -On need to follow-up on duodenal biopsy pathology results from [facility]. Per oncology. 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 Ashok Cordia consult--case discussed with him Left hemiparesis secondary to metastatic colon cancer -  metastatic disease noted on MRI head which shows a 19 mm high right frontoparietal mass consistent with solitary metastases. Moderate vasogenic edema without shift.  -Continue po Decadron -pt had one SRS fraction on 05/11/15 -pathology report from duodenal biopsy at [facility] adenocarcinoma  Hypertension -Resume home dose Norvasc if BP climbs  -Hydralazine as needed. -discontinue losartan HCTZ-->BP remains stable -HCTZ contributing to hyponatremia hyponatremia - likely due to volume depletion from poor oral intake -IV fluids-->improved Gapped metabolic acidosis -trend -lactic acid  Family Communication: Son updated at beside 05/25/15 Disposition Plan: SNF 2-3 days   Procedures/Studies: Dg Chest 2 View  05/23/2015  CLINICAL DATA:  Altered mental status, weakness and fever. History of colon cancer. EXAM: CHEST  2 VIEW COMPARISON:  Chest x-ray dated 07/06/2013. FINDINGS: Mild cardiomegaly is stable. Atherosclerotic changes again noted at the aortic arch. Vague opacity at the left costophrenic angle is grossly stable, most compatible with chronic atelectasis or chronic pleural thickening. Lungs are otherwise clear. No evidence of pneumonia. Mild degenerative change again noted within the thoracic spine. Degenerative changes also noted at each shoulder. No acute- appearing osseous abnormality. IMPRESSION: No active cardiopulmonary disease. Electronically Signed   By: Franki Cabot M.D.   On: 05/23/2015 21:41   Ct Head Wo Contrast  05/23/2015  CLINICAL DATA:  Acute onset of altered level of consciousness. Fever and malodorous urine. Nausea and vomiting. Initial encounter. EXAM: CT HEAD WITHOUT CONTRAST TECHNIQUE: Contiguous axial images were obtained from the base of the skull through the vertex without intravenous contrast. COMPARISON:  MRI of  the brain performed 05/10/2015 FINDINGS: A 1.9 cm right parasagittal mass is again noted at the convexity, with surrounding decreased attenuation within the white matter. No midline shift is seen. Mild periventricular white matter change likely reflects small vessel ischemic microangiopathy. There is no evidence of acute intra or extra-axial hemorrhage. There is no evidence for acute infarct. The brainstem and fourth ventricle are within normal limits. The basal ganglia are unremarkable in appearance. The cerebral hemispheres demonstrate grossly normal gray-white differentiation. No mass effect or midline shift is seen. There is no evidence of fracture; visualized osseous structures are unremarkable in appearance. The orbits are within normal limits. The paranasal sinuses and mastoid air cells are well-aerated. No significant soft tissue abnormalities are seen. IMPRESSION: 1. 1.9 cm right parasagittal mass at the convexity, with surrounding decreased attenuation within the white matter. No midline shift seen. 2. Mild small vessel ischemic microangiopathy. Electronically Signed   By: Garald Balding M.D.   On: 05/23/2015 21:58   Ct Angio Chest Pe W/cm &/or Wo Cm  05/25/2015  CLINICAL DATA:  Elevated D-dimer. Metastatic colon cancer. New atrial fibrillation. EXAM: CT ANGIOGRAPHY CHEST WITH CONTRAST TECHNIQUE: Multidetector CT imaging of the chest was performed using the standard protocol during bolus administration of intravenous contrast. Multiplanar CT image reconstructions and MIPs were obtained to evaluate the vascular anatomy. CONTRAST:  165mL OMNIPAQUE IOHEXOL 350 MG/ML SOLN COMPARISON:  Mid chest radiographs dated 05/23/2015 and chest CT dated 03/30/2015. FINDINGS: Mediastinum/Lymph Nodes: No pulmonary emboli or thoracic aortic dissection identified. No masses or pathologically enlarged lymph nodes identified. Lungs/Pleura: Multiple bilateral lung nodules. The largest is a 10 mm upper lobe nodule on image  number 25 of series 7. There are multiple additional smaller nodules throughout both lungs. There are also small bilateral pleural effusions and mild bilateral lower lobe atelectasis. Upper abdomen: Progressive biliary ductal dilatation. The left hepatic duct currently measures 14 mm in diameter. This previously measured 6  mm. Musculoskeletal: Marked right shoulder degenerative changes and incompletely included left shoulder degenerative changes. Extensive, large calcified loose bodies are again demonstrated inferior and medial to the right shoulder. No evidence of bony metastatic disease. Review of the MIP images confirms the above findings. IMPRESSION: 1. No pulmonary emboli. 2. Small bilateral pleural effusions. 3. Mild bilateral lower lobe atelectasis. 4. Multiple bilateral lung nodules compatible with lung metastases. 5. Biliary obstruction with a significant interval increase in size of the intrahepatic ducts. This suggest at the pancreatic head abnormality seen on the previous CTA may have represented a pancreatic neoplasm. This is not included on the current examination. Electronically Signed   By: Claudie Revering M.D.   On: 05/25/2015 18:53   Mr [name] [id number] Contrast  05/10/2015  CLINICAL DATA:  Colon cancer. LEFT hemiparesis, leg greater than arm. Confusion. SRS planning EXAM: MRI HEAD WITHOUT AND WITH CONTRAST TECHNIQUE: Multiplanar, multiecho pulse sequences of the brain and surrounding structures were obtained without and with intravenous contrast. CONTRAST:  30mL MULTIHANCE GADOBENATE DIMEGLUMINE 529 MG/ML IV SOLN COMPARISON:  1.5 tesla MR 05/08/2015. FINDINGS: Redemonstrated is 14 x 18 x 18 mm (R-L x A-P x C-C) superficial, but intra-axial mass, RIGHT posterior frontal cortex, parasagittal location with surrounding vasogenic edema. There is avid postcontrast enhancement. Marked surrounding vasogenic edema with no midline shift but slight depression of the RIGHT lateral ventricle. No additional brain  lesions. No osseous lesions. Extracranial soft tissues unremarkable. No significant change from previous exam. IMPRESSION: Superficial, but intra-axial, RIGHT posterior frontal parasagittal metastasis. This appears to be a solitary lesion, approximately 2 cm. Extensive vasogenic edema but no midline shift. Electronically Signed   By: Staci Righter M.D.   On: 05/10/2015 15:32   Mr [name] [id number] Contrast  05/08/2015  CLINICAL DATA:  Left hemi paresis. History of colon cancer. Metastasis versus infarct. EXAM: MRI HEAD WITHOUT AND WITH CONTRAST TECHNIQUE: Multiplanar, multiecho pulse sequences of the brain and surrounding structures were obtained without and with intravenous contrast. CONTRAST:  10 cc MultiHance intravenous COMPARISON:  None. FINDINGS: Calvarium and upper cervical spine: No definitive skeletal metastasis. Orbits: Bilateral cataract resection.  Negative for mass Sinuses and Mastoids: Clear. Brain: There is an enhancing intra-axial mass in the parasagittal right frontal parietal lobe along the upper motor strip measuring 19 mm. Moderate vasogenic edema without shift or herniation. No other masses identified. MR signal shows dense cellularity. No definitive hemorrhagic component. No acute infarct, hemorrhage, hydrocephalus, or major vessel occlusion. Metastasis is unexpected finding, but to accelerate care these results will be called to the ordering clinician or representative by the Radiologist Assistant, and communication documented in the PACS or zVision Dashboard. IMPRESSION: 19 mm high right frontoparietal mass consistent with solitary metastasis. Moderate vasogenic edema without shift. Electronically Signed   By: Monte Fantasia M.D.   On: 05/08/2015 11:40   Ct Abdomen Pelvis W Contrast  05/24/2015  CLINICAL DATA:  Abdominal pain.  Sepsis.  History of colon cancer. EXAM: CT ABDOMEN AND PELVIS WITH CONTRAST TECHNIQUE: Multidetector CT imaging of the abdomen and pelvis was performed using the  standard protocol following bolus administration of intravenous contrast. CONTRAST:  55mL OMNIPAQUE IOHEXOL 300 MG/ML SOLN, 92mL OMNIPAQUE IOHEXOL 300 MG/ML SOLN COMPARISON:  03/30/2015 FINDINGS: Lower chest: Small pleural effusions are noted left greater night. There is overlying compressive type atelectasis and consolidation involving the left lower lobe. Nodule in the lingula is identified measuring 7 mm, image number 1 of series 4. New from previous exam. New right  lower lobe pulmonary nodule is identified measuring 4 mm, image 12 of series 4. Also new from previous exam is a lingular nodule measuring 4 mm, image 2 of series 4. Hepatobiliary: No focal liver abnormality identified. There has been interval progression of intrahepatic bile duct dilatation. The common bile duct measures 1.6 cm, image number 39 of series 6. On the previous exam this measured 0.9 cm. Distal common bile duct stricture is suspected, image 39 of series 6. Pancreas: Mild diffuse edema of the pancreas. The appearance is similar to previous exam. Spleen: Calcified granulomas identified within the spleen. Adrenals/Urinary Tract: Scratch set bilateral adrenal nodules are again identified. There is left-sided hydronephrosis, progressed from previous exam. Normal appearance of the right kidney. The urinary bladder appears within normal limits. Stomach/Bowel: The stomach and the small bowel loops have a normal course and caliber. No bowel obstruction. No dilated loops of small or large bowel loops identified at this time. Vascular/Lymphatic: Calcified atherosclerotic disease involves the abdominal aorta. No aneurysm. Index portacaval node measures 1 cm, image 30 of series 5. Previously 1.3 cm. Gastrohepatic ligament lymph node measures 9 mm, image 29 of series 5. Previously 1.2 cm. The index left periaortic node measures 1.5 cm, image 38 of series 5. Previously 1.1 cm. Reproductive: The uterus and the adnexal structures are unremarkable. Other:  Small fat containing hernias are identified at the previous ostomy site within the right lower quadrant ventral abdominal wall, image 51 of series 5. There is a small amount of adjacent fluid within the ventral abdominal wall measuring 2.7 by 0.9 cm, image 51 of series 5. No significant free fluid within the abdomen or pelvis and no fluid collections identified. Musculoskeletal: Spondylosis noted within the lumbar spine. No aggressive lytic or sclerotic bone lesions. IMPRESSION: 1. Interval progression of intrahepatic and common bile duct dilatation. A stricture involving the distal common bile duct is suspected. Underlying mass lesion cannot be excluded. 2. Progressive dilatation of the left renal collecting system to the level of the UPJ. Findings are compatible with hydronephrosis. No obstructing stone or mass noted. 3. Increase in size of left periaortic lymph node. Portacaval and gastrohepatic ligament lymph nodes have decreased in size in the interval. 4. New pulmonary nodules are identified within the lungs, suspicious for metastatic disease. Electronically Signed   By: Kerby Moors M.D.   On: 05/24/2015 13:11         Subjective: Patient complains of intermittent nausea. Denies any fevers, chills, chest pain, vomiting, diarrhea, dysuria, hematuria. No rashes.  Objective: Filed Vitals:   05/26/15 2019 05/27/15 0455 05/27/15 1215 05/27/15 1403  BP: 132/98 127/96 128/90 96/81  Pulse: 126 142 122 97  Temp: 97.7 F (36.5 C) 98 F (36.7 C)  98 F (36.7 C)  TempSrc: Oral Oral  Oral  Resp: 20 21  22   Height:      Weight:  56.79 kg (125 lb 3.2 oz)    SpO2: 100% 99%  100%    Intake/Output Summary (Last 24 hours) at 05/27/15 1421 Last data filed at 05/27/15 1407  Gross per 24 hour  Intake 3036.25 ml  Output   1150 ml  Net 1886.25 ml   Weight change: -0.363 kg (-12.8 oz) Exam:   General:  Pt is alert, follows commands appropriately, not in acute distress  HEENT: No icterus, No  thrush, No neck mass, [place]/AT  Cardiovascular: IRRR, S1/S2, no rubs, no gallops  Respiratory: Bibasilar crackles. No wheezing. Good air movement  Abdomen: Soft/+BS, non tender, non  distended, no guarding  Extremities: No edema, No lymphangitis, No petechiae, No rashes, no synovitis  Data Reviewed: Basic Metabolic Panel:  Recent Labs Lab 05/23/15 2120 05/25/15 0427 05/26/15 0539 05/27/15 0544  NA 135 140 143 143  K 4.2 3.5 4.6 5.1  CL 102 113* 115* 117*  CO2 20* 19* 15* 15*  GLUCOSE 123* 123* 118* 131*  BUN 20 20 19  24*  CREATININE 0.64 0.51 0.45 0.52  CALCIUM 8.9 8.0* 8.6* 8.9  MG  --   --  1.9 2.1   Liver Function Tests:  Recent Labs Lab 05/23/15 2120 05/25/15 0427  AST 20 19  ALT 20 20  ALKPHOS 80 68  BILITOT 0.9 0.3  PROT 6.0* 4.9*  ALBUMIN 2.5* 1.8*   No results for input(s): LIPASE, AMYLASE in the last 168 hours. No results for input(s): AMMONIA in the last 168 hours. CBC:  Recent Labs Lab 05/23/15 2120 05/24/15 0607 05/25/15 0427 05/26/15 0539 05/27/15 0544  WBC 22.6* 19.4* 14.7* 14.3* 16.4*  NEUTROABS 21.2*  --   --   --   --   HGB 13.3 12.7 10.0* 10.8* 11.1*  HCT 39.6 37.9 30.2* 33.3* 34.1*  MCV 86.3 86.7 87.8 89.0 89.5  PLT 245 246 229 273 336   Cardiac Enzymes:  Recent Labs Lab 05/24/15 0037 05/24/15 0607 05/24/15 1405  TROPONINI 0.45* 0.63* 0.59*   BNP: Invalid input(s): POCBNP CBG: No results for input(s): GLUCAP in the last 168 hours.  Recent Results (from the past 240 hour(s))  Culture, blood (Routine X 2) w Reflex to ID Panel     Status: None   Collection Time: 05/23/15  9:20 PM  Result Value Ref Range Status   Specimen Description BLOOD RIGHT HAND  Final   Special Requests IN PEDIATRIC BOTTLE 2CC  Final   Culture  Setup Time   Final    GRAM POSITIVE COCCI IN CLUSTERS PEDIATRIC BOTTLE S. WEST,RN AT 1451 ON [id number] BY Rhea Bleacher    Culture   Final    STAPHYLOCOCCUS AUREUS SUSCEPTIBILITIES PERFORMED ON PREVIOUS CULTURE  WITHIN THE LAST 5 DAYS. Performed at [facility]    Report Status 05/26/2015 FINAL  Final  Culture, blood (Routine X 2) w Reflex to ID Panel     Status: None   Collection Time: 05/23/15  9:35 PM  Result Value Ref Range Status   Specimen Description BLOOD LEFT FOREARM  Final   Special Requests BOTTLES DRAWN AEROBIC AND ANAEROBIC 5CC  Final   Culture  Setup Time   Final    GRAM POSITIVE COCCI IN CLUSTERS IN BOTH AEROBIC AND ANAEROBIC BOTTLES CRITICAL RESULT CALLED TO, READ BACK BY AND VERIFIED WITH: I BOULOUDENE,RN AT [id number] 05/24/15 BY L BENFIELD    Culture   Final    STAPHYLOCOCCUS AUREUS Performed at [facility]    Report Status 05/26/2015 FINAL  Final   Organism ID, Bacteria STAPHYLOCOCCUS AUREUS  Final      Susceptibility   Staphylococcus aureus - MIC*    CIPROFLOXACIN <=0.5 SENSITIVE Sensitive     ERYTHROMYCIN 0.5 SENSITIVE Sensitive     GENTAMICIN <=0.5 SENSITIVE Sensitive     OXACILLIN 0.5 SENSITIVE Sensitive     TETRACYCLINE <=1 SENSITIVE Sensitive     VANCOMYCIN 1 SENSITIVE Sensitive     TRIMETH/SULFA <=10 SENSITIVE Sensitive     CLINDAMYCIN <=0.25 SENSITIVE Sensitive     RIFAMPIN <=0.5 SENSITIVE Sensitive     Inducible Clindamycin NEGATIVE Sensitive     * STAPHYLOCOCCUS  AUREUS  Urine culture     Status: None   Collection Time: 05/23/15  9:55 PM  Result Value Ref Range Status   Specimen Description URINE, CATHETERIZED  Final   Special Requests Normal  Final   Culture   Final    >=100,000 COLONIES/mL STAPHYLOCOCCUS AUREUS Performed at [facility]    Report Status 05/26/2015 FINAL  Final   Organism ID, Bacteria STAPHYLOCOCCUS AUREUS  Final      Susceptibility   Staphylococcus aureus - MIC*    CIPROFLOXACIN <=0.5 SENSITIVE Sensitive     GENTAMICIN <=0.5 SENSITIVE Sensitive     NITROFURANTOIN <=16 SENSITIVE Sensitive     OXACILLIN 0.5 SENSITIVE Sensitive     TETRACYCLINE <=1 SENSITIVE Sensitive     VANCOMYCIN 1 SENSITIVE Sensitive      TRIMETH/SULFA <=10 SENSITIVE Sensitive     CLINDAMYCIN <=0.25 SENSITIVE Sensitive     RIFAMPIN <=0.5 SENSITIVE Sensitive     Inducible Clindamycin NEGATIVE Sensitive     * >=100,000 COLONIES/mL STAPHYLOCOCCUS AUREUS  MRSA PCR Screening     Status: None   Collection Time: 05/24/15  7:02 PM  Result Value Ref Range Status   MRSA by PCR NEGATIVE NEGATIVE Final    Comment:        The GeneXpert MRSA Assay (FDA approved for NASAL specimens only), is one component of a comprehensive MRSA colonization surveillance program. It is not intended to diagnose MRSA infection nor to guide or monitor treatment for MRSA infections.   Culture, blood (routine x 2)     Status: None (Preliminary result)   Collection Time: 05/25/15  6:00 PM  Result Value Ref Range Status   Specimen Description BLOOD RIGHT HAND  Final   Special Requests IN PEDIATRIC BOTTLE 2.5CC  Final   Culture  Setup Time   Final    GRAM POSITIVE COCCI IN CLUSTERS PEDIATRIC BOTTLE CALLED TO SEAY,K RN 05/26/15 1754 [place] CONFIRMED BY K WOOLLEN    Culture   Final    STAPHYLOCOCCUS AUREUS SUSCEPTIBILITIES PERFORMED ON PREVIOUS CULTURE WITHIN THE LAST 5 DAYS. Performed at [facility]    Report Status PENDING  Incomplete  Culture, blood (routine x 2)     Status: None (Preliminary result)   Collection Time: 05/25/15  6:08 PM  Result Value Ref Range Status   Specimen Description BLOOD RIGHT HAND  Final   Special Requests IN PEDIATRIC BOTTLE 1CC  Final   Culture  Setup Time   Final    GRAM POSITIVE COCCI IN CLUSTERS PEDIATRIC BOTTLE CALLED TO SEAY,K RN 05/26/15 1755 [place]    Culture   Final    STAPHYLOCOCCUS AUREUS SUSCEPTIBILITIES PERFORMED ON PREVIOUS CULTURE WITHIN THE LAST 5 DAYS. Performed at [facility]    Report Status PENDING  Incomplete     Scheduled Meds: . aspirin EC  81 mg Oral Daily  .  ceFAZolin (ANCEF) IV  2 g Intravenous 3 times per day  . dexamethasone  4 mg Oral Daily  . feeding  supplement (ENSURE ENLIVE)  237 mL Oral TID BM  . metoprolol tartrate  25 mg Oral 4 times per day  . prochlorperazine  2.5 mg Oral TID  . senna  1 tablet Oral BID   Continuous Infusions: . sodium chloride 75 mL/hr at 05/27/15 1334     Offie Pickron, DO  [facility] Pager [phone]  If 7PM-7AM, please contact night-coverage [email or web address] Password [email or web address] 05/27/2015, 2:21 PM   LOS: 3 days

## 2015-05-27 NOTE — Progress Notes (Signed)
Received call from  central telemetry that  patient was in afib with RVR with a HR of 151 and 162. Patient remain asymptomatic. Cardiology paged and one time order for 5mg  IV metoprolol  given. RR nurse made aware. Another order received to give metoprolol 25mg  po. Patient tolerated well. Resting quietly in bed @ this time.

## 2015-05-27 NOTE — Progress Notes (Signed)
PT Cancellation Note  Patient Details Name: [name] MRN: [id number] DOB: November 09, 1924   Cancelled Treatment:    Reason Eval/Treat Not Completed: Medical issues which prohibited therapy (HR in 140's)   [name] 05/27/2015, 9:53 AM Tresa Endo PT [phone]

## 2015-05-27 NOTE — Progress Notes (Signed)
    SUBJECTIVE:  No pain.  Stomach not feeling well this morning.     PHYSICAL EXAM Filed Vitals:   05/26/15 1328 05/26/15 1731 05/26/15 2019 05/27/15 0455  BP: 140/86 139/82 132/98 127/96  Pulse: 111 133 126 142  Temp: 98.6 F (37 C)  97.7 F (36.5 C) 98 F (36.7 C)  TempSrc: Oral  Oral Oral  Resp: 18 18 20 21   Height:      Weight:    125 lb 3.2 oz (56.79 kg)  SpO2: 100% 100% 100% 99%   General:  No distress Lungs:  Clear  Heart:  Irregular  Abdomen:  Positive bowel sounds, no rebound no guarding Extremities:  No edema   LABS:  Results for orders placed or performed during the hospital encounter of 05/23/15 (from the past 24 hour(s))  CBC     Status: Abnormal   Collection Time: 05/27/15  5:44 AM  Result Value Ref Range   WBC 16.4 (H) 4.0 - 10.5 K/uL   RBC 3.81 (L) 3.87 - 5.11 MIL/uL   Hemoglobin 11.1 (L) 12.0 - 15.0 g/dL   HCT 34.1 (L) 36.0 - 46.0 %   MCV 89.5 78.0 - 100.0 fL   MCH 29.1 26.0 - 34.0 pg   MCHC 32.6 30.0 - 36.0 g/dL   RDW 17.5 (H) 11.5 - 15.5 %   Platelets 336 150 - 400 K/uL  Basic metabolic panel     Status: Abnormal   Collection Time: 05/27/15  5:44 AM  Result Value Ref Range   Sodium 143 135 - 145 mmol/L   Potassium 5.1 3.5 - 5.1 mmol/L   Chloride 117 (H) 101 - 111 mmol/L   CO2 15 (L) 22 - 32 mmol/L   Glucose, Bld 131 (H) 65 - 99 mg/dL   BUN 24 (H) 6 - 20 mg/dL   Creatinine, Ser 0.52 0.44 - 1.00 mg/dL   Calcium 8.9 8.9 - 10.3 mg/dL   GFR calc non Af Amer >60 >60 mL/min   GFR calc Af Amer >60 >60 mL/min   Anion gap 11 5 - 15  Magnesium     Status: None   Collection Time: 05/27/15  5:44 AM  Result Value Ref Range   Magnesium 2.1 1.7 - 2.4 mg/dL    Intake/Output Summary (Last 24 hours) at 05/27/15 0835 Last data filed at 05/27/15 0457  Gross per 24 hour  Intake 1828.75 ml  Output    950 ml  Net 878.75 ml     ASSESSMENT AND PLAN:  ATRIAL FIB:   Rate faster overnight.  Given the absence of recurrent sustained bradyarrhythmias beta  blocker was restarted.  I will increase the dose.   DEMAND ISCHEMIA:  Not a NSTEMI.   No further work up given severe comorbidities.      Minus Breeding 05/27/2015 8:35 AM

## 2015-05-28 DIAGNOSIS — I248 Other forms of acute ischemic heart disease: Secondary | ICD-10-CM

## 2015-05-28 DIAGNOSIS — I480 Paroxysmal atrial fibrillation: Secondary | ICD-10-CM

## 2015-05-28 DIAGNOSIS — A4101 Sepsis due to Methicillin susceptible Staphylococcus aureus: Principal | ICD-10-CM

## 2015-05-28 LAB — BASIC METABOLIC PANEL
Anion gap: 10 (ref 5–15)
BUN: 24 mg/dL — AB (ref 6–20)
CHLORIDE: 115 mmol/L — AB (ref 101–111)
CO2: 15 mmol/L — ABNORMAL LOW (ref 22–32)
CREATININE: 0.47 mg/dL (ref 0.44–1.00)
Calcium: 8.4 mg/dL — ABNORMAL LOW (ref 8.9–10.3)
GFR calc Af Amer: >60 mL/min (ref >60)
GLUCOSE: 141 mg/dL — AB (ref 65–99)
Potassium: 4.3 mmol/L (ref 3.5–5.1)
SODIUM: 140 mmol/L (ref 135–145)

## 2015-05-28 LAB — CBC
HCT: 33.5 % — ABNORMAL LOW (ref 36.0–46.0)
Hemoglobin: 10.7 g/dL — ABNORMAL LOW (ref 12.0–15.0)
MCH: 28.5 pg (ref 26.0–34.0)
MCHC: 31.9 g/dL (ref 30.0–36.0)
MCV: 89.1 fL (ref 78.0–100.0)
PLATELETS: 240 10*3/uL (ref 150–400)
RBC: 3.76 MIL/uL — ABNORMAL LOW (ref 3.87–5.11)
RDW: 17.3 % — ABNORMAL HIGH (ref 11.5–15.5)
WBC: 13 10*3/uL — ABNORMAL HIGH (ref 4.0–10.5)

## 2015-05-28 LAB — CULTURE, BLOOD (ROUTINE X 2)

## 2015-05-28 MED ORDER — DOCUSATE SODIUM 100 MG PO CAPS
100.0000 mg | ORAL_CAPSULE | Freq: Two times a day (BID) | ORAL | Status: DC
  Administered 2015-05-28 – 2015-05-29 (×2): 100 mg via ORAL
  Filled 2015-05-28 (×2): qty 1

## 2015-05-28 MED ORDER — LACTULOSE 10 GM/15ML PO SOLN
30.0000 g | Freq: Once | ORAL | Status: AC
Start: 2015-05-28 — End: 2015-05-28
  Administered 2015-05-28: 30 g via ORAL
  Filled 2015-05-28: qty 60

## 2015-05-28 NOTE — Evaluation (Signed)
Physical Therapy Evaluation Patient Details Name: [name] MRN: [id number] DOB: September 21, 1924 Today's Date: 05/28/2015   History of Present Illness    [name] is a 82 y.o. 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 Patient is typically confused and has significant cognitive deficits at baseline, but is able to communicate her basic needs and recognizes her family.  Clinical Impression  Pt admitted with above diagnosis. Pt currently with functional limitations due to the deficits listed below (see PT Problem List).  Pt will benefit from skilled PT to increase their independence and safety with mobility to allow discharge to the venue listed below.       Follow Up Recommendations SNF    Equipment Recommendations  None recommended by PT    Recommendations for Other Services       Precautions / Restrictions Precautions Precautions: Fall      Mobility  Bed Mobility Overal bed mobility: Needs Assistance Bed Mobility: Supine to Sit;Sit to Supine;Rolling Rolling: Mod assist (to right for pressure relief as pt c/o her buttocks being numb)   Supine to sit: Mod assist Sit to supine: Mod assist   General bed mobility comments: verbal and tactile cues for pt participation, tehcnique  Transfers Overall transfer level: Needs assistance               General transfer comment: pt refused OOB; pt was max to total assist to scoot laterally in sitting on EOB, using pad to assist  Ambulation/Gait                Stairs            Wheelchair Mobility    Modified Rankin (Stroke Patients Only)       Balance Overall balance assessment: Needs assistance Sitting-balance support: Feet supported;Bilateral upper extremity supported;No upper extremity supported Sitting balance-Leahy Scale: Fair       Standing balance-Leahy Scale:  Fair                               Pertinent Vitals/Pain Pain Assessment: Faces Faces Pain Scale: Hurts a little bit Pain Descriptors / Indicators: Grimacing Pain Intervention(s): Limited activity within patient's tolerance;Monitored during session    Home Living   Living Arrangements: Alone   Type of Home: House Home Access: Stairs to enter Entrance Stairs-Rails: Left Entrance Stairs-Number of Steps: 1 - side, 4 - front Home Layout: One level Home Equipment: Hand held shower head;Walker - 2 wheels;Bedside commode      Prior Function Level of Independence: Independent         Comments: pt independent prior to approximately 1 month aqo; pt was at [facility] prior to this adm     Hand Dominance   Dominant Hand: Right    Extremity/Trunk Assessment   Upper Extremity Assessment: Generalized weakness;LUE deficits/detail       LUE Deficits / Details: decreased AROM, weakness greater proximally. Grossly 2+/5 MMT; unable to flex bil shoulders greater than 65*   Lower Extremity Assessment: Generalized weakness   LLE Deficits / Details: grossly 2/5 to 2+/5 ankle and knee; hip flexors 2-/5     Communication   Communication: Expressive difficulties  Cognition Arousal/Alertness: Awake/alert Behavior During Therapy: WFL for tasks assessed/performed Overall Cognitive Status: Within Functional Limits for tasks assessed  General Comments General comments (skin integrity, edema, etc.): pt instructed in assisting with scooting to [facility] while in supine; instructed in pressure relief while in bed    Exercises        Assessment/Plan    PT Assessment Patient needs continued PT services  PT Diagnosis Hemiplegia non-dominant side;Generalized weakness   PT Problem List Decreased strength;Decreased activity tolerance;Decreased balance;Decreased mobility;Decreased coordination;Decreased knowledge of use of DME;Decreased safety awareness   PT Treatment Interventions DME instruction;Gait training;Functional mobility training;Therapeutic activities;Therapeutic exercise;Neuromuscular re-education;Balance training   PT Goals (Current goals can be found in the Care Plan section) Acute Rehab PT Goals Patient Stated Goal: go  back to  rehab PT Goal Formulation: With patient Time For Goal Achievement: 06/11/15 Potential to Achieve Goals: Fair    Frequency Min 3X/week   Barriers to discharge Decreased caregiver support      Co-evaluation               End of Session   Activity Tolerance: Patient limited by fatigue Patient left: in bed;with call bell/phone within reach;with bed alarm set           Time: [id number] PT Time Calculation (min) (ACUTE ONLY): 23 min   Charges:   PT Evaluation $PT Eval Moderate Complexity: 1 Procedure PT Treatments $Therapeutic Activity: 8-22 mins   PT G Codes:        Ludie Pavlik 06/26/2015, 4:19 PM

## 2015-05-28 NOTE — Progress Notes (Signed)
INFECTIOUS DISEASE PROGRESS NOTE  ID: [name] is a 82 y.o. 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   Left hemiparesis ([place])   Essential hypertension   Metastasis to brain [facility])   UTI (lower urinary tract infection)   Demand ischemia ([facility])   Staphylococcus aureus bacteremia   Elevated d-dimer  Subjective: Without complaints  Abtx:  Anti-infectives    Start     Dose/Rate Route Frequency Ordered Stop   05/27/15 1400  ceFAZolin (ANCEF) IVPB 2 g/50 mL premix     2 g 100 mL/hr over 30 Minutes Intravenous 3 times per day 05/27/15 1321     05/25/15 0600  ceFEPIme (MAXIPIME) 2 g in dextrose 5 % 50 mL IVPB  Status:  Discontinued     2 g 100 mL/hr over 30 Minutes Intravenous Every 24 hours 05/24/15 0458 05/25/15 1733   05/24/15 2200  vancomycin (VANCOCIN) IVPB 750 mg/150 ml premix  Status:  Discontinued     750 mg 150 mL/hr over 60 Minutes Intravenous Every 24 hours 05/23/15 2207 05/24/15 0006   05/24/15 2200  vancomycin (VANCOCIN) IVPB 750 mg/150 ml premix  Status:  Discontinued     750 mg 150 mL/hr over 60 Minutes Intravenous Every 24 hours 05/24/15 1421 05/27/15 1321   05/24/15 0600  piperacillin-tazobactam (ZOSYN) IVPB 3.375 g  Status:  Discontinued     3.375 g 12.5 mL/hr over 240 Minutes Intravenous Every 8 hours 05/23/15 2207 05/24/15 0006   05/24/15 0015  ceFEPIme (MAXIPIME) 2 g in dextrose 5 % 50 mL IVPB     2 g 100 mL/hr over 30 Minutes Intravenous  Once 05/24/15 0006 05/24/15 0715   05/23/15 2200  piperacillin-tazobactam (ZOSYN) IVPB 3.375 g     3.375 g 100 mL/hr over 30 Minutes Intravenous STAT 05/23/15 2147 05/23/15 2246   05/23/15 2200  vancomycin (VANCOCIN) IVPB 1000 mg/200 mL premix     1,000 mg 200 mL/hr over 60 Minutes Intravenous STAT 05/23/15 2147 05/24/15 0012      Medications:  Scheduled: . aspirin EC  81 mg Oral Daily  .  ceFAZolin (ANCEF) IV   2 g Intravenous 3 times per day  . dexamethasone  4 mg Oral Daily  . feeding supplement (ENSURE ENLIVE)  237 mL Oral TID BM  . metoprolol tartrate  25 mg Oral 4 times per day  . prochlorperazine  2.5 mg Oral TID  . senna  1 tablet Oral BID    Objective: Vital signs in last 24 hours: Temp:  [97.1 F (36.2 C)-97.9 F (36.6 C)] 97.6 F (36.4 C) (03/20 1253) Pulse Rate:  [78-134] 81 (03/20 1253) Resp:  [18-22] 18 (03/20 1253) BP: (114-155)/(78-89) 138/78 mmHg (03/20 1253) SpO2:  [100 %] 100 % (03/20 1253) Weight:  [56.8 kg (125 lb 3.5 oz)] 56.8 kg (125 lb 3.5 oz) (03/20 0549)   General appearance: alert, cooperative and no distress Resp: diminished breath sounds bilaterally Cardio: regular rate and rhythm GI: normal findings: bowel sounds normal and soft, non-tender  Lab Results  Recent Labs  05/27/15 0544 05/28/15 0511  WBC 16.4* 13.0*  HGB 11.1* 10.7*  HCT 34.1* 33.5*  NA 143 140  K 5.1 4.3  CL 117* 115*  CO2 15* 15*  BUN 24* 24*  CREATININE 0.52 0.47   Liver Panel No results for input(s): PROT, ALBUMIN, AST, ALT, ALKPHOS, BILITOT, BILIDIR, IBILI in the last 72 hours.  Sedimentation Rate No results for input(s): ESRSEDRATE in the last 72 hours. C-Reactive Protein No results for input(s): CRP in the last 72 hours.  Microbiology: Recent Results (from the past 240 hour(s))  Culture, blood (Routine X 2) w Reflex to ID Panel     Status: None   Collection Time: 05/23/15  9:20 PM  Result Value Ref Range Status   Specimen Description BLOOD RIGHT HAND  Final   Special Requests IN PEDIATRIC BOTTLE 2CC  Final   Culture  Setup Time   Final    GRAM POSITIVE COCCI IN CLUSTERS PEDIATRIC BOTTLE S. WEST,RN AT [id number] ON [id number] BY Rhea Bleacher    Culture   Final    STAPHYLOCOCCUS AUREUS SUSCEPTIBILITIES PERFORMED ON PREVIOUS CULTURE WITHIN THE LAST 5 DAYS. Performed at [facility]    Report Status 05/26/2015 FINAL  Final  Culture, blood (Routine X 2) w Reflex to ID  Panel     Status: None   Collection Time: 05/23/15  9:35 PM  Result Value Ref Range Status   Specimen Description BLOOD LEFT FOREARM  Final   Special Requests BOTTLES DRAWN AEROBIC AND ANAEROBIC 5CC  Final   Culture  Setup Time   Final    GRAM POSITIVE COCCI IN CLUSTERS IN BOTH AEROBIC AND ANAEROBIC BOTTLES CRITICAL RESULT CALLED TO, READ BACK BY AND VERIFIED WITH: I BOULOUDENE,RN AT [id number] 05/24/15 BY L BENFIELD    Culture   Final    STAPHYLOCOCCUS AUREUS Performed at [facility]    Report Status 05/26/2015 FINAL  Final   Organism ID, Bacteria STAPHYLOCOCCUS AUREUS  Final      Susceptibility   Staphylococcus aureus - MIC*    CIPROFLOXACIN <=0.5 SENSITIVE Sensitive     ERYTHROMYCIN 0.5 SENSITIVE Sensitive     GENTAMICIN <=0.5 SENSITIVE Sensitive     OXACILLIN 0.5 SENSITIVE Sensitive     TETRACYCLINE <=1 SENSITIVE Sensitive     VANCOMYCIN 1 SENSITIVE Sensitive     TRIMETH/SULFA <=10 SENSITIVE Sensitive     CLINDAMYCIN <=0.25 SENSITIVE Sensitive     RIFAMPIN <=0.5 SENSITIVE Sensitive     Inducible Clindamycin NEGATIVE Sensitive     * STAPHYLOCOCCUS AUREUS  Urine culture     Status: None   Collection Time: 05/23/15  9:55 PM  Result Value Ref Range Status   Specimen Description URINE, CATHETERIZED  Final   Special Requests Normal  Final   Culture   Final    >=100,000 COLONIES/mL STAPHYLOCOCCUS AUREUS Performed at [facility]    Report Status 05/26/2015 FINAL  Final   Organism ID, Bacteria STAPHYLOCOCCUS AUREUS  Final      Susceptibility   Staphylococcus aureus - MIC*    CIPROFLOXACIN <=0.5 SENSITIVE Sensitive     GENTAMICIN <=0.5 SENSITIVE Sensitive     NITROFURANTOIN <=16 SENSITIVE Sensitive     OXACILLIN 0.5 SENSITIVE Sensitive     TETRACYCLINE <=1 SENSITIVE Sensitive     VANCOMYCIN 1 SENSITIVE Sensitive     TRIMETH/SULFA <=10 SENSITIVE Sensitive     CLINDAMYCIN <=0.25 SENSITIVE Sensitive     RIFAMPIN <=0.5 SENSITIVE Sensitive     Inducible Clindamycin  NEGATIVE Sensitive     * >=100,000 COLONIES/mL STAPHYLOCOCCUS AUREUS  MRSA PCR Screening     Status: None   Collection Time: 05/24/15  7:02 PM  Result Value Ref Range Status   MRSA by PCR NEGATIVE NEGATIVE Final    Comment:        The GeneXpert MRSA Assay (FDA approved for NASAL  specimens only), is one component of a comprehensive MRSA colonization surveillance program. It is not intended to diagnose MRSA infection nor to guide or monitor treatment for MRSA infections.   Culture, blood (routine x 2)     Status: None   Collection Time: 05/25/15  6:00 PM  Result Value Ref Range Status   Specimen Description BLOOD RIGHT HAND  Final   Special Requests IN PEDIATRIC BOTTLE 2.5CC  Final   Culture  Setup Time   Final    GRAM POSITIVE COCCI IN CLUSTERS PEDIATRIC BOTTLE CALLED TO SEAY,K RN 05/26/15 1754 [place] BY K WOOLLEN    Culture   Final    STAPHYLOCOCCUS AUREUS SUSCEPTIBILITIES PERFORMED ON PREVIOUS CULTURE WITHIN THE LAST 5 DAYS. Performed at [facility]    Report Status 05/28/2015 FINAL  Final  Culture, blood (routine x 2)     Status: None   Collection Time: 05/25/15  6:08 PM  Result Value Ref Range Status   Specimen Description BLOOD RIGHT HAND  Final   Special Requests IN PEDIATRIC BOTTLE 1CC  Final   Culture  Setup Time   Final    GRAM POSITIVE COCCI IN CLUSTERS PEDIATRIC BOTTLE CALLED TO SEAY,K RN 05/26/15 1755 [place]    Culture   Final    STAPHYLOCOCCUS AUREUS SUSCEPTIBILITIES PERFORMED ON PREVIOUS CULTURE WITHIN THE LAST 5 DAYS. Performed at [facility]    Report Status 05/28/2015 FINAL  Final  Culture, blood (Routine X 2) w Reflex to ID Panel     Status: None (Preliminary result)   Collection Time: 05/28/15  5:14 AM  Result Value Ref Range Status   Specimen Description   Final    BLOOD RIGHT HAND Performed at [facility]    Special Requests BOTTLES DRAWN AEROBIC AND ANAEROBIC [facility]  Final   Culture PENDING  Incomplete    Report Status PENDING  Incomplete    Studies/Results: No results found.   Assessment/Plan: MSSA bacteremia  Metastatic colon CA  Total days of antibiotics: 4/14 anbx  Would continue ancef to complete 14 days total Please f/u repeat BCx Available as needed         Bobby Rumpf Infectious Diseases (pager) [phone] [email or web address] 05/28/2015, 3:17 PM  LOS: 4 days

## 2015-05-28 NOTE — Progress Notes (Signed)
Per central telemetry, Patient had un sustained SVT with HR 189 this morning. Patient aysmptomatic at this time.

## 2015-05-28 NOTE — Care Management Important Message (Signed)
Important Message  Patient Details  Name: [name] MRN: [id number] Date of Birth: 1925/02/15   Medicare Important Message Given:  Yes    Camillo Flaming 05/28/2015, 11:14 AMImportant Message  Patient Details  Name: [name] MRN: [id number] Date of Birth: [birth year]   Medicare Important Message Given:  Yes    Camillo Flaming 05/28/2015, 11:13 AM

## 2015-05-28 NOTE — Progress Notes (Signed)
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 Does not remember having AMS and does not remember coming to the hospital. Feeling much better now.   CURRENT MEDS . aspirin EC  81 mg Oral Daily  .  ceFAZolin (ANCEF) IV  2 g Intravenous 3 times per day  . dexamethasone  4 mg Oral Daily  . feeding supplement (ENSURE ENLIVE)  237 mL Oral TID BM  . metoprolol tartrate  25 mg Oral 4 times per day  . prochlorperazine  2.5 mg Oral TID  . senna  1 tablet Oral BID    OBJECTIVE  Filed Vitals:   05/27/15 1403 05/27/15 1756 05/27/15 2137 05/28/15 0549  BP: 96/81 114/89 140/87 155/83  Pulse: 97 134 87 78  Temp: 98 F (36.7 C)  97.9 F (36.6 C) 97.1 F (36.2 C)  TempSrc: Oral  Oral Oral  Resp: 22  22   Height:      Weight:    125 lb 3.5 oz (56.8 kg)  SpO2: 100%  100% 100%    Intake/Output Summary (Last 24 hours) at 05/28/15 1215 Last data filed at 05/28/15 0700  Gross per 24 hour  Intake   2685 ml  Output   1000 ml  Net   1685 ml   Filed Weights   05/26/15 0500 05/27/15 0455 05/28/15 0549  Weight: 126 lb (57.153 kg) 125 lb 3.2 oz (56.79 kg) 125 lb 3.5 oz (56.8 kg)    PHYSICAL EXAM  General: Pleasant, NAD. Neuro: Alert and oriented X 3. L sided weakness. Psych: Normal affect. HEENT:  Normal  Neck: Supple without bruits or JVD. Lungs:  Resp regular and unlabored, CTA. Heart: RRR no s3, s4, or murmurs. Abdomen: Soft, non-tender, non-distended, BS + x 4.  Extremities: No clubbing, cyanosis or edema. DP/PT/Radials 2+ and equal bilaterally.  Accessory Clinical Findings  CBC  Recent  Labs  05/27/15 0544 05/28/15 0511  WBC 16.4* 13.0*  HGB 11.1* 10.7*  HCT 34.1* 33.5*  MCV 89.5 89.1  PLT 336 [id number]   Basic Metabolic Panel  Recent Labs  05/26/15 0539 05/27/15 0544 05/28/15 0511  NA 143 143 140  K 4.6 5.1 4.3  CL 115* 117* 115*  CO2 15* 15* 15*  GLUCOSE 118* 131* 141*  BUN 19 24* 24*  CREATININE 0.45 0.52 0.47  CALCIUM 8.6* 8.9 8.4*  MG 1.9 2.1  --    D-Dimer  Recent Labs  05/25/15 1232  DDIMER 2.32*    TELE afib last night, has since converted to NSR    ECG  NSR  Echocardiogram 05/25/2015  LV EF: 55% - 60%  ------------------------------------------------------------------- History: PMH: Sepsis. Essential hypertension. NSTEMI. Hyperlipdemia. Elevated troponin. Altered mental status.  ------------------------------------------------------------------- Study Conclusions  - Left ventricle: The cavity size was normal. Wall thickness was  normal. Systolic function was normal. The estimated ejection  fraction was in the range of 55% to 60%. Possible hypokinesis of  the basalinferior myocardium. - Aortic valve: There was moderate regurgitation. - Mitral valve: Calcified annulus. There was mild regurgitation. - Left atrium: The atrium was moderately dilated. -  Right atrium: The atrium was mildly dilated.  Impressions:  - Possible hypokinesis of the basal inferior wall; overall  preserved LV function; biatrial enlargement; mild MR; moderate  AI; mild TR.    Radiology/Studies  Dg Chest 2 View  05/23/2015  CLINICAL DATA:  Altered mental status, weakness and fever. History of colon cancer. EXAM: CHEST  2 VIEW COMPARISON:  Chest x-ray dated 07/06/2013. FINDINGS: Mild cardiomegaly is stable. Atherosclerotic changes again noted at the aortic arch. Vague opacity at the left costophrenic angle is grossly stable, most compatible with chronic atelectasis or chronic pleural thickening. Lungs are otherwise clear. No evidence of  pneumonia. Mild degenerative change again noted within the thoracic spine. Degenerative changes also noted at each shoulder. No acute- appearing osseous abnormality. IMPRESSION: No active cardiopulmonary disease. Electronically Signed   By: Franki Cabot M.D.   On: 05/23/2015 21:41   Ct Head Wo Contrast  05/23/2015  CLINICAL DATA:  Acute onset of altered level of consciousness. Fever and malodorous urine. Nausea and vomiting. Initial encounter. EXAM: CT HEAD WITHOUT CONTRAST TECHNIQUE: Contiguous axial images were obtained from the base of the skull through the vertex without intravenous contrast. COMPARISON:  MRI of the brain performed 05/10/2015 FINDINGS: A 1.9 cm right parasagittal mass is again noted at the convexity, with surrounding decreased attenuation within the white matter. No midline shift is seen. Mild periventricular white matter change likely reflects small vessel ischemic microangiopathy. There is no evidence of acute intra or extra-axial hemorrhage. There is no evidence for acute infarct. The brainstem and fourth ventricle are within normal limits. The basal ganglia are unremarkable in appearance. The cerebral hemispheres demonstrate grossly normal gray-white differentiation. No mass effect or midline shift is seen. There is no evidence of fracture; visualized osseous structures are unremarkable in appearance. The orbits are within normal limits. The paranasal sinuses and mastoid air cells are well-aerated. No significant soft tissue abnormalities are seen. IMPRESSION: 1. 1.9 cm right parasagittal mass at the convexity, with surrounding decreased attenuation within the white matter. No midline shift seen. 2. Mild small vessel ischemic microangiopathy. Electronically Signed   By: Garald Balding M.D.   On: 05/23/2015 21:58   Ct Angio Chest Pe W/cm &/or Wo Cm  05/25/2015  CLINICAL DATA:  Elevated D-dimer. Metastatic colon cancer. New atrial fibrillation. EXAM: CT ANGIOGRAPHY CHEST WITH CONTRAST  TECHNIQUE: Multidetector CT imaging of the chest was performed using the standard protocol during bolus administration of intravenous contrast. Multiplanar CT image reconstructions and MIPs were obtained to evaluate the vascular anatomy. CONTRAST:  168mL OMNIPAQUE IOHEXOL 350 MG/ML SOLN COMPARISON:  Mid chest radiographs dated 05/23/2015 and chest CT dated 03/30/2015. FINDINGS: Mediastinum/Lymph Nodes: No pulmonary emboli or thoracic aortic dissection identified. No masses or pathologically enlarged lymph nodes identified. Lungs/Pleura: Multiple bilateral lung nodules. The largest is a 10 mm upper lobe nodule on image number 25 of series 7. There are multiple additional smaller nodules throughout both lungs. There are also small bilateral pleural effusions and mild bilateral lower lobe atelectasis. Upper abdomen: Progressive biliary ductal dilatation. The left hepatic duct currently measures 14 mm in diameter. This previously measured 6 mm. Musculoskeletal: Marked right shoulder degenerative changes and incompletely included left shoulder degenerative changes. Extensive, large calcified loose bodies are again demonstrated inferior and medial to the right shoulder. No evidence of bony metastatic disease. Review of the MIP images confirms the above findings. IMPRESSION: 1. No pulmonary emboli. 2. Small bilateral pleural effusions. 3. Mild bilateral lower lobe atelectasis. 4. Multiple bilateral lung  nodules compatible with lung metastases. 5. Biliary obstruction with a significant interval increase in size of the intrahepatic ducts. This suggest at the pancreatic head abnormality seen on the previous CTA may have represented a pancreatic neoplasm. This is not included on the current examination. Electronically Signed   By: Claudie Revering M.D.   On: 05/25/2015 18:53   Mr [name] [id number] Contrast  05/10/2015  CLINICAL DATA:  Colon cancer. LEFT hemiparesis, leg greater than arm. Confusion. SRS planning EXAM: MRI HEAD WITHOUT  AND WITH CONTRAST TECHNIQUE: Multiplanar, multiecho pulse sequences of the brain and surrounding structures were obtained without and with intravenous contrast. CONTRAST:  83mL MULTIHANCE GADOBENATE DIMEGLUMINE 529 MG/ML IV SOLN COMPARISON:  1.5 tesla MR 05/08/2015. FINDINGS: Redemonstrated is 14 x 18 x 18 mm (R-L x A-P x C-C) superficial, but intra-axial mass, RIGHT posterior frontal cortex, parasagittal location with surrounding vasogenic edema. There is avid postcontrast enhancement. Marked surrounding vasogenic edema with no midline shift but slight depression of the RIGHT lateral ventricle. No additional brain lesions. No osseous lesions. Extracranial soft tissues unremarkable. No significant change from previous exam. IMPRESSION: Superficial, but intra-axial, RIGHT posterior frontal parasagittal metastasis. This appears to be a solitary lesion, approximately 2 cm. Extensive vasogenic edema but no midline shift. Electronically Signed   By: Staci Righter M.D.   On: 05/10/2015 15:32   Mr [name] [id number] Contrast  05/08/2015  CLINICAL DATA:  Left hemi paresis. History of colon cancer. Metastasis versus infarct. EXAM: MRI HEAD WITHOUT AND WITH CONTRAST TECHNIQUE: Multiplanar, multiecho pulse sequences of the brain and surrounding structures were obtained without and with intravenous contrast. CONTRAST:  10 cc MultiHance intravenous COMPARISON:  None. FINDINGS: Calvarium and upper cervical spine: No definitive skeletal metastasis. Orbits: Bilateral cataract resection.  Negative for mass Sinuses and Mastoids: Clear. Brain: There is an enhancing intra-axial mass in the parasagittal right frontal parietal lobe along the upper motor strip measuring 19 mm. Moderate vasogenic edema without shift or herniation. No other masses identified. MR signal shows dense cellularity. No definitive hemorrhagic component. No acute infarct, hemorrhage, hydrocephalus, or major vessel occlusion. Metastasis is unexpected finding, but to  accelerate care these results will be called to the ordering clinician or representative by the Radiologist Assistant, and communication documented in the PACS or zVision Dashboard. IMPRESSION: 19 mm high right frontoparietal mass consistent with solitary metastasis. Moderate vasogenic edema without shift. Electronically Signed   By: Monte Fantasia M.D.   On: 05/08/2015 11:40   Ct Abdomen Pelvis W Contrast  05/24/2015  CLINICAL DATA:  Abdominal pain.  Sepsis.  History of colon cancer. EXAM: CT ABDOMEN AND PELVIS WITH CONTRAST TECHNIQUE: Multidetector CT imaging of the abdomen and pelvis was performed using the standard protocol following bolus administration of intravenous contrast. CONTRAST:  63mL OMNIPAQUE IOHEXOL 300 MG/ML SOLN, 14mL OMNIPAQUE IOHEXOL 300 MG/ML SOLN COMPARISON:  03/30/2015 FINDINGS: Lower chest: Small pleural effusions are noted left greater night. There is overlying compressive type atelectasis and consolidation involving the left lower lobe. Nodule in the lingula is identified measuring 7 mm, image number 1 of series 4. New from previous exam. New right lower lobe pulmonary nodule is identified measuring 4 mm, image 12 of series 4. Also new from previous exam is a lingular nodule measuring 4 mm, image 2 of series 4. Hepatobiliary: No focal liver abnormality identified. There has been interval progression of intrahepatic bile duct dilatation. The common bile duct measures 1.6 cm, image number 39 of series 6. On the previous exam  this measured 0.9 cm. Distal common bile duct stricture is suspected, image 39 of series 6. Pancreas: Mild diffuse edema of the pancreas. The appearance is similar to previous exam. Spleen: Calcified granulomas identified within the spleen. Adrenals/Urinary Tract: Scratch set bilateral adrenal nodules are again identified. There is left-sided hydronephrosis, progressed from previous exam. Normal appearance of the right kidney. The urinary bladder appears within  normal limits. Stomach/Bowel: The stomach and the small bowel loops have a normal course and caliber. No bowel obstruction. No dilated loops of small or large bowel loops identified at this time. Vascular/Lymphatic: Calcified atherosclerotic disease involves the abdominal aorta. No aneurysm. Index portacaval node measures 1 cm, image 30 of series 5. Previously 1.3 cm. Gastrohepatic ligament lymph node measures 9 mm, image 29 of series 5. Previously 1.2 cm. The index left periaortic node measures 1.5 cm, image 38 of series 5. Previously 1.1 cm. Reproductive: The uterus and the adnexal structures are unremarkable. Other: Small fat containing hernias are identified at the previous ostomy site within the right lower quadrant ventral abdominal wall, image 51 of series 5. There is a small amount of adjacent fluid within the ventral abdominal wall measuring 2.7 by 0.9 cm, image 51 of series 5. No significant free fluid within the abdomen or pelvis and no fluid collections identified. Musculoskeletal: Spondylosis noted within the lumbar spine. No aggressive lytic or sclerotic bone lesions. IMPRESSION: 1. Interval progression of intrahepatic and common bile duct dilatation. A stricture involving the distal common bile duct is suspected. Underlying mass lesion cannot be excluded. 2. Progressive dilatation of the left renal collecting system to the level of the UPJ. Findings are compatible with hydronephrosis. No obstructing stone or mass noted. 3. Increase in size of left periaortic lymph node. Portacaval and gastrohepatic ligament lymph nodes have decreased in size in the interval. 4. New pulmonary nodules are identified within the lungs, suspicious for metastatic disease. Electronically Signed   By: Kerby Moors M.D.   On: 05/24/2015 13:11    ASSESSMENT AND PLAN  82 yo female with h/o stage IV colon CA s/p R colectomy and ileostomy with metastasis to brain presented from SNF with AMS and fever.   1. Colon CA with  metastasis to the brain  2. Atrial fibrillation with RVR: converted overnight, currently in NSR  - reportedly had bradycardia, however no strip to back it up. Metoprolol restarted at 25mg  QID, no significant bradycardia noted on telemetry. Stable from cardiac perspective  - echo 05/25/2015 EF 55-60%, possible hypokinesis fo basalinferior myocardium, moderate AR, mild MR.   - given lack of cardiac symptom and stage IV colon CA, would not pursue any ischemia workup for that possible wall motion abnormality on echo.  3. Sepsis aureus bacteremia and UTI  4. Demand ischemia   Signed, Almyra Deforest PA-C Pager: [phone]   I have examined the patient and reviewed assessment and plan and discussed with patient.  Agree with above as stated.  No CP or SHOB.  No ischemia w/u planned at this time.  Not a good anticoagulation candidate.  Continue metoprolol. Will sign off at this time.  Please call with questions.   Neoma Uhrich S.

## 2015-05-28 NOTE — Progress Notes (Signed)
PROGRESS NOTE  [name] [id number] DOB: Sep 23, 1924 DOA: 05/23/2015 PCP:  Melinda Crutch, MD  Brief History 82 y.o. 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 At baseline, the patient has mild cognitive deficit, but is able to express her needs and recognize her family. However, on the morning of admission, she reportedly called her son at approximately 10 AM to report crushing chest pain. He reportedly informed SNF staff of this complaint. When he went to visit her later in the afternoon, she was unable to recognize him and unable to give any information about her chest pain or other symptoms. She was noted to be febrile to 100.15F and a foul odor to the urine was also reported. EMS was activated and she was transported to the hospital for evaluation of acute confusional state and fever. In the emergency department, the patient was noted to have elevated troponin up to 0.63, WBC 22.6, lactic acid 2.27. The patient was started on vancomycin and cefepime and given 1.5 L bolus of normal saline. She was also started on a heparin drip due to concerns for possible NSTEMI. The patient was recently discharged to [facility] from the hospital on 05/11/2015 after suffering hemiparesis secondary to brain metastasis from her colon cancer.  Assessment/Plan: Sepsis secondary to UTI and Bacteremia--MSSA - Meets sepsis criteria on admission with fever, tachycardia, leukocytosis, elevated lactate, grossly positive UA  - Blood and urine cultures--MSSA - pt does not have any foreign bodies, no recent PICC or port -abx per ID -05/28/15--d/c vanco, start cefazolin -3/15 and 3/17 blood cultures positive -Chest x-ray negative for infiltrates -plan intravenous cefazolin for 14 days beyond the first negative  culture -05/28/2015--blood culture--pending -Plan to place a PICC line after 05/28/2015 blood culture remains negative Afib with RVR -05/25/15--echo EF 55-60%, moderate AI, HK of basalinferior myocardium -?due to PE-->CTA chest with elevated D-Dimer--neg for PE -pt became bradycardic with diltiazem drip-->d/c -05/25/15--initially placed on metoprolol--hold due to bradycardia now with 6 second pause -cardiology following--signed off 05/28/2015 -05/26/2015--patient developed RVR 120s--restart her metoprolol--increased to 25 mg q 6 hrs-->better controlled -Not an anticoagulation candidate secondary to brain metastasis and overall poor prognosis Elevated troponin/Chest pain -Likely demand ischemia -No chest pain presently -EKG--sinus rhythm with nonspecific T-wave inversion -AppreciateCardiology consult -05/26/2015--CTA chest negative for PE, bilateral small pleural effusions Left Hydronephrosis -suspect this may be due to her adenopathy -renal function remains preserved -consulted urology--discussed with Dr. Alyce Pagan pt has preserved renal function and pt is NOT to receive any further chemo or radiation from oncologic standpoint, performing surgery and placing another foreign body in the setting of sepsis is not indicated at this time Acute metabolic encephalopathy -Secondary to infectious process -At baseline, the patient is able to converse and clarify her needs -improved, back to baseline Abdominal pain -CT abdomen in setting of sepsis--Interval progression of intrahepatic and common bile duct dilatation. A stricture involving the distal common bile duct is Suspected. Progressive L-hydronephrosis -abd pain overall improving -Biliary stricture is chronic and likely due to carcinomatosis--no further intervention at this time secondary to poor prognosis stage IIIB poorly differentiated adenocarcinoma of the right colon Possible recurrent disease at the upper abdomen  (carcinomatosis) and the right brain. See problem #1.  -On need to follow-up on duodenal biopsy pathology results from [facility]. Per oncology. -  Minimal CBD obstruction and duodenal partial obstruction-- biopsies from [facility]-->showed adenocarcinoma from EUS -Appreciate Dr. Ashok Cordia consult--case discussed with him Left hemiparesis secondary to metastatic colon cancer -metastatic disease noted on MRI head which shows a 19 mm high right frontoparietal mass consistent with solitary metastases. Moderate vasogenic edema without shift.  -Continue po Decadron -pt had one SRS fraction on 05/11/15 -pathology report from duodenal biopsy at [facility] adenocarcinoma  Hypertension -Resume home dose Norvasc if BP climbs  -Hydralazine as needed. -discontinue losartan HCTZ-->BP remains stable -HCTZ contributing to hyponatremia hyponatremia - likely due to volume depletion from poor oral intake -IV fluids-->improved nonGapped metabolic acidosis -trend Constipation -Continue senna and Colace -Patient does not want enema or suppository -addLactulose   Family Communication: Son updated at beside 05/25/15 Disposition Plan: SNF when repeat blood cultures are neg x 5 days   Procedures/Studies: Dg Chest 2 View  05/23/2015  CLINICAL DATA:  Altered mental status, weakness and fever. History of colon cancer. EXAM: CHEST  2 VIEW COMPARISON:  Chest x-ray dated 07/06/2013. FINDINGS: Mild cardiomegaly is stable. Atherosclerotic changes again noted at the aortic arch. Vague opacity at the left costophrenic angle is grossly stable, most compatible with chronic atelectasis or chronic pleural thickening. Lungs are otherwise clear. No evidence of pneumonia. Mild degenerative change again noted within the thoracic spine. Degenerative changes also noted at each shoulder. No acute- appearing osseous abnormality. IMPRESSION: No active cardiopulmonary disease. Electronically Signed   By: Franki Cabot  M.D.   On: 05/23/2015 21:41   Ct Head Wo Contrast  05/23/2015  CLINICAL DATA:  Acute onset of altered level of consciousness. Fever and malodorous urine. Nausea and vomiting. Initial encounter. EXAM: CT HEAD WITHOUT CONTRAST TECHNIQUE: Contiguous axial images were obtained from the base of the skull through the vertex without intravenous contrast. COMPARISON:  MRI of the brain performed 05/10/2015 FINDINGS: A 1.9 cm right parasagittal mass is again noted at the convexity, with surrounding decreased attenuation within the white matter. No midline shift is seen. Mild periventricular white matter change likely reflects small vessel ischemic microangiopathy. There is no evidence of acute intra or extra-axial hemorrhage. There is no evidence for acute infarct. The brainstem and fourth ventricle are within normal limits. The basal ganglia are unremarkable in appearance. The cerebral hemispheres demonstrate grossly normal gray-white differentiation. No mass effect or midline shift is seen. There is no evidence of fracture; visualized osseous structures are unremarkable in appearance. The orbits are within normal limits. The paranasal sinuses and mastoid air cells are well-aerated. No significant soft tissue abnormalities are seen. IMPRESSION: 1. 1.9 cm right parasagittal mass at the convexity, with surrounding decreased attenuation within the white matter. No midline shift seen. 2. Mild small vessel ischemic microangiopathy. Electronically Signed   By: Garald Balding M.D.   On: 05/23/2015 21:58   Ct Angio Chest Pe W/cm &/or Wo Cm  05/25/2015  CLINICAL DATA:  Elevated D-dimer. Metastatic colon cancer. New atrial fibrillation. EXAM: CT ANGIOGRAPHY CHEST WITH CONTRAST TECHNIQUE: Multidetector CT imaging of the chest was performed using the standard protocol during bolus administration of intravenous contrast. Multiplanar CT image reconstructions and MIPs were obtained to evaluate the vascular anatomy. CONTRAST:  183mL  OMNIPAQUE IOHEXOL 350 MG/ML SOLN COMPARISON:  Mid chest radiographs dated 05/23/2015 and chest CT dated 03/30/2015. FINDINGS: Mediastinum/Lymph Nodes: No pulmonary emboli or thoracic aortic dissection identified. No masses or pathologically enlarged lymph nodes identified. Lungs/Pleura: Multiple bilateral lung nodules. The largest is a 10 mm upper lobe nodule on image number  25 of series 7. There are multiple additional smaller nodules throughout both lungs. There are also small bilateral pleural effusions and mild bilateral lower lobe atelectasis. Upper abdomen: Progressive biliary ductal dilatation. The left hepatic duct currently measures 14 mm in diameter. This previously measured 6 mm. Musculoskeletal: Marked right shoulder degenerative changes and incompletely included left shoulder degenerative changes. Extensive, large calcified loose bodies are again demonstrated inferior and medial to the right shoulder. No evidence of bony metastatic disease. Review of the MIP images confirms the above findings. IMPRESSION: 1. No pulmonary emboli. 2. Small bilateral pleural effusions. 3. Mild bilateral lower lobe atelectasis. 4. Multiple bilateral lung nodules compatible with lung metastases. 5. Biliary obstruction with a significant interval increase in size of the intrahepatic ducts. This suggest at the pancreatic head abnormality seen on the previous CTA may have represented a pancreatic neoplasm. This is not included on the current examination. Electronically Signed   By: Claudie Revering M.D.   On: 05/25/2015 18:53   Mr [name] [id number] Contrast  05/10/2015  CLINICAL DATA:  Colon cancer. LEFT hemiparesis, leg greater than arm. Confusion. SRS planning EXAM: MRI HEAD WITHOUT AND WITH CONTRAST TECHNIQUE: Multiplanar, multiecho pulse sequences of the brain and surrounding structures were obtained without and with intravenous contrast. CONTRAST:  66mL MULTIHANCE GADOBENATE DIMEGLUMINE 529 MG/ML IV SOLN COMPARISON:  1.5 tesla MR  05/08/2015. FINDINGS: Redemonstrated is 14 x 18 x 18 mm (R-L x A-P x C-C) superficial, but intra-axial mass, RIGHT posterior frontal cortex, parasagittal location with surrounding vasogenic edema. There is avid postcontrast enhancement. Marked surrounding vasogenic edema with no midline shift but slight depression of the RIGHT lateral ventricle. No additional brain lesions. No osseous lesions. Extracranial soft tissues unremarkable. No significant change from previous exam. IMPRESSION: Superficial, but intra-axial, RIGHT posterior frontal parasagittal metastasis. This appears to be a solitary lesion, approximately 2 cm. Extensive vasogenic edema but no midline shift. Electronically Signed   By: Staci Righter M.D.   On: 05/10/2015 15:32   Mr [name] [id number] Contrast  05/08/2015  CLINICAL DATA:  Left hemi paresis. History of colon cancer. Metastasis versus infarct. EXAM: MRI HEAD WITHOUT AND WITH CONTRAST TECHNIQUE: Multiplanar, multiecho pulse sequences of the brain and surrounding structures were obtained without and with intravenous contrast. CONTRAST:  10 cc MultiHance intravenous COMPARISON:  None. FINDINGS: Calvarium and upper cervical spine: No definitive skeletal metastasis. Orbits: Bilateral cataract resection.  Negative for mass Sinuses and Mastoids: Clear. Brain: There is an enhancing intra-axial mass in the parasagittal right frontal parietal lobe along the upper motor strip measuring 19 mm. Moderate vasogenic edema without shift or herniation. No other masses identified. MR signal shows dense cellularity. No definitive hemorrhagic component. No acute infarct, hemorrhage, hydrocephalus, or major vessel occlusion. Metastasis is unexpected finding, but to accelerate care these results will be called to the ordering clinician or representative by the Radiologist Assistant, and communication documented in the PACS or zVision Dashboard. IMPRESSION: 19 mm high right frontoparietal mass consistent with solitary  metastasis. Moderate vasogenic edema without shift. Electronically Signed   By: Monte Fantasia M.D.   On: 05/08/2015 11:40   Ct Abdomen Pelvis W Contrast  05/24/2015  CLINICAL DATA:  Abdominal pain.  Sepsis.  History of colon cancer. EXAM: CT ABDOMEN AND PELVIS WITH CONTRAST TECHNIQUE: Multidetector CT imaging of the abdomen and pelvis was performed using the standard protocol following bolus administration of intravenous contrast. CONTRAST:  55mL OMNIPAQUE IOHEXOL 300 MG/ML SOLN, 35mL OMNIPAQUE IOHEXOL 300 MG/ML SOLN COMPARISON:  03/30/2015 FINDINGS: Lower chest: Small pleural effusions are noted left greater night. There is overlying compressive type atelectasis and consolidation involving the left lower lobe. Nodule in the lingula is identified measuring 7 mm, image number 1 of series 4. New from previous exam. New right lower lobe pulmonary nodule is identified measuring 4 mm, image 12 of series 4. Also new from previous exam is a lingular nodule measuring 4 mm, image 2 of series 4. Hepatobiliary: No focal liver abnormality identified. There has been interval progression of intrahepatic bile duct dilatation. The common bile duct measures 1.6 cm, image number 39 of series 6. On the previous exam this measured 0.9 cm. Distal common bile duct stricture is suspected, image 39 of series 6. Pancreas: Mild diffuse edema of the pancreas. The appearance is similar to previous exam. Spleen: Calcified granulomas identified within the spleen. Adrenals/Urinary Tract: Scratch set bilateral adrenal nodules are again identified. There is left-sided hydronephrosis, progressed from previous exam. Normal appearance of the right kidney. The urinary bladder appears within normal limits. Stomach/Bowel: The stomach and the small bowel loops have a normal course and caliber. No bowel obstruction. No dilated loops of small or large bowel loops identified at this time. Vascular/Lymphatic: Calcified atherosclerotic disease involves  the abdominal aorta. No aneurysm. Index portacaval node measures 1 cm, image 30 of series 5. Previously 1.3 cm. Gastrohepatic ligament lymph node measures 9 mm, image 29 of series 5. Previously 1.2 cm. The index left periaortic node measures 1.5 cm, image 38 of series 5. Previously 1.1 cm. Reproductive: The uterus and the adnexal structures are unremarkable. Other: Small fat containing hernias are identified at the previous ostomy site within the right lower quadrant ventral abdominal wall, image 51 of series 5. There is a small amount of adjacent fluid within the ventral abdominal wall measuring 2.7 by 0.9 cm, image 51 of series 5. No significant free fluid within the abdomen or pelvis and no fluid collections identified. Musculoskeletal: Spondylosis noted within the lumbar spine. No aggressive lytic or sclerotic bone lesions. IMPRESSION: 1. Interval progression of intrahepatic and common bile duct dilatation. A stricture involving the distal common bile duct is suspected. Underlying mass lesion cannot be excluded. 2. Progressive dilatation of the left renal collecting system to the level of the UPJ. Findings are compatible with hydronephrosis. No obstructing stone or mass noted. 3. Increase in size of left periaortic lymph node. Portacaval and gastrohepatic ligament lymph nodes have decreased in size in the interval. 4. New pulmonary nodules are identified within the lungs, suspicious for metastatic disease. Electronically Signed   By: Kerby Moors M.D.   On: 05/24/2015 13:11         Subjective: Patient complains that food does not taste good. She denies any fevers, chills, chest pain, both, vomiting, diarrhea. She complains of constipation. Denies any headache or neck pain.  Objective: Filed Vitals:   05/28/15 0549 05/28/15 1253 05/28/15 1600 05/28/15 1714  BP: 155/83 138/78  121/71  Pulse: 78 81  75  Temp: 97.1 F (36.2 C) 97.6 F (36.4 C)    TempSrc: Oral Oral    Resp:  18    Height:       Weight: 56.8 kg (125 lb 3.5 oz)     SpO2: 100% 100% 99% 99%    Intake/Output Summary (Last 24 hours) at 05/28/15 1956 Last data filed at 05/28/15 0700  Gross per 24 hour  Intake 1046.25 ml  Output    800 ml  Net 246.25 ml  Weight change: 0.01 kg (0.3 oz) Exam:   General:  Pt is alert, follows commands appropriately, not in acute distress  HEENT: No icterus, No thrush, No neck mass, [place]/AT  Cardiovascular: RRR, S1/S2, no rubs, no gallops  Respiratory: CTA bilaterally, no wheezing, no crackles, no rhonchi  Abdomen: Soft/+BS, non tender, non distended, no guarding  Extremities: No edema, No lymphangitis, No petechiae, No rashes, no synovitis  Data Reviewed: Basic Metabolic Panel:  Recent Labs Lab 05/23/15 2120 05/25/15 0427 05/26/15 0539 05/27/15 0544 05/28/15 0511  NA 135 140 143 143 140  K 4.2 3.5 4.6 5.1 4.3  CL 102 113* 115* 117* 115*  CO2 20* 19* 15* 15* 15*  GLUCOSE 123* 123* 118* 131* 141*  BUN 20 20 19  24* 24*  CREATININE 0.64 0.51 0.45 0.52 0.47  CALCIUM 8.9 8.0* 8.6* 8.9 8.4*  MG  --   --  1.9 2.1  --    Liver Function Tests:  Recent Labs Lab 05/23/15 2120 05/25/15 0427  AST 20 19  ALT 20 20  ALKPHOS 80 68  BILITOT 0.9 0.3  PROT 6.0* 4.9*  ALBUMIN 2.5* 1.8*   No results for input(s): LIPASE, AMYLASE in the last 168 hours. No results for input(s): AMMONIA in the last 168 hours. CBC:  Recent Labs Lab 05/23/15 2120 05/24/15 0607 05/25/15 0427 05/26/15 0539 05/27/15 0544 05/28/15 0511  WBC 22.6* 19.4* 14.7* 14.3* 16.4* 13.0*  NEUTROABS 21.2*  --   --   --   --   --   HGB 13.3 12.7 10.0* 10.8* 11.1* 10.7*  HCT 39.6 37.9 30.2* 33.3* 34.1* 33.5*  MCV 86.3 86.7 87.8 89.0 89.5 89.1  PLT 245 246 229 273 336 240   Cardiac Enzymes:  Recent Labs Lab 05/24/15 0037 05/24/15 0607 05/24/15 1405  TROPONINI 0.45* 0.63* 0.59*   BNP: Invalid input(s): POCBNP CBG: No results for input(s): GLUCAP in the last 168 hours.  Recent Results  (from the past 240 hour(s))  Culture, blood (Routine X 2) w Reflex to ID Panel     Status: None   Collection Time: 05/23/15  9:20 PM  Result Value Ref Range Status   Specimen Description BLOOD RIGHT HAND  Final   Special Requests IN PEDIATRIC BOTTLE 2CC  Final   Culture  Setup Time   Final    GRAM POSITIVE COCCI IN CLUSTERS PEDIATRIC BOTTLE S. WEST,RN AT 1451 ON [id number] BY Rhea Bleacher    Culture   Final    STAPHYLOCOCCUS AUREUS SUSCEPTIBILITIES PERFORMED ON PREVIOUS CULTURE WITHIN THE LAST 5 DAYS. Performed at [facility]    Report Status 05/26/2015 FINAL  Final  Culture, blood (Routine X 2) w Reflex to ID Panel     Status: None   Collection Time: 05/23/15  9:35 PM  Result Value Ref Range Status   Specimen Description BLOOD LEFT FOREARM  Final   Special Requests BOTTLES DRAWN AEROBIC AND ANAEROBIC 5CC  Final   Culture  Setup Time   Final    GRAM POSITIVE COCCI IN CLUSTERS IN BOTH AEROBIC AND ANAEROBIC BOTTLES CRITICAL RESULT CALLED TO, READ BACK BY AND VERIFIED WITH: I BOULOUDENE,RN AT [id number] 05/24/15 BY L BENFIELD    Culture   Final    STAPHYLOCOCCUS AUREUS Performed at [facility]    Report Status 05/26/2015 FINAL  Final   Organism ID, Bacteria STAPHYLOCOCCUS AUREUS  Final      Susceptibility   Staphylococcus aureus - MIC*    CIPROFLOXACIN <=0.5 SENSITIVE Sensitive  ERYTHROMYCIN 0.5 SENSITIVE Sensitive     GENTAMICIN <=0.5 SENSITIVE Sensitive     OXACILLIN 0.5 SENSITIVE Sensitive     TETRACYCLINE <=1 SENSITIVE Sensitive     VANCOMYCIN 1 SENSITIVE Sensitive     TRIMETH/SULFA <=10 SENSITIVE Sensitive     CLINDAMYCIN <=0.25 SENSITIVE Sensitive     RIFAMPIN <=0.5 SENSITIVE Sensitive     Inducible Clindamycin NEGATIVE Sensitive     * STAPHYLOCOCCUS AUREUS  Urine culture     Status: None   Collection Time: 05/23/15  9:55 PM  Result Value Ref Range Status   Specimen Description URINE, CATHETERIZED  Final   Special Requests Normal  Final   Culture   Final     >=100,000 COLONIES/mL STAPHYLOCOCCUS AUREUS Performed at [facility]    Report Status 05/26/2015 FINAL  Final   Organism ID, Bacteria STAPHYLOCOCCUS AUREUS  Final      Susceptibility   Staphylococcus aureus - MIC*    CIPROFLOXACIN <=0.5 SENSITIVE Sensitive     GENTAMICIN <=0.5 SENSITIVE Sensitive     NITROFURANTOIN <=16 SENSITIVE Sensitive     OXACILLIN 0.5 SENSITIVE Sensitive     TETRACYCLINE <=1 SENSITIVE Sensitive     VANCOMYCIN 1 SENSITIVE Sensitive     TRIMETH/SULFA <=10 SENSITIVE Sensitive     CLINDAMYCIN <=0.25 SENSITIVE Sensitive     RIFAMPIN <=0.5 SENSITIVE Sensitive     Inducible Clindamycin NEGATIVE Sensitive     * >=100,000 COLONIES/mL STAPHYLOCOCCUS AUREUS  MRSA PCR Screening     Status: None   Collection Time: 05/24/15  7:02 PM  Result Value Ref Range Status   MRSA by PCR NEGATIVE NEGATIVE Final    Comment:        The GeneXpert MRSA Assay (FDA approved for NASAL specimens only), is one component of a comprehensive MRSA colonization surveillance program. It is not intended to diagnose MRSA infection nor to guide or monitor treatment for MRSA infections.   Culture, blood (routine x 2)     Status: None   Collection Time: 05/25/15  6:00 PM  Result Value Ref Range Status   Specimen Description BLOOD RIGHT HAND  Final   Special Requests IN PEDIATRIC BOTTLE 2.5CC  Final   Culture  Setup Time   Final    GRAM POSITIVE COCCI IN CLUSTERS PEDIATRIC BOTTLE CALLED TO SEAY,K RN 05/26/15 1754 [place] BY K WOOLLEN    Culture   Final    STAPHYLOCOCCUS AUREUS SUSCEPTIBILITIES PERFORMED ON PREVIOUS CULTURE WITHIN THE LAST 5 DAYS. Performed at [facility]    Report Status 05/28/2015 FINAL  Final  Culture, blood (routine x 2)     Status: None   Collection Time: 05/25/15  6:08 PM  Result Value Ref Range Status   Specimen Description BLOOD RIGHT HAND  Final   Special Requests IN PEDIATRIC BOTTLE 1CC  Final   Culture  Setup Time   Final     GRAM POSITIVE COCCI IN CLUSTERS PEDIATRIC BOTTLE CALLED TO SEAY,K RN 05/26/15 1755 [place]    Culture   Final    STAPHYLOCOCCUS AUREUS SUSCEPTIBILITIES PERFORMED ON PREVIOUS CULTURE WITHIN THE LAST 5 DAYS. Performed at [facility]    Report Status 05/28/2015 FINAL  Final  Culture, blood (Routine X 2) w Reflex to ID Panel     Status: None (Preliminary result)   Collection Time: 05/28/15  5:14 AM  Result Value Ref Range Status   Specimen Description   Final    BLOOD RIGHT HAND Performed at [facility]    Special Requests BOTTLES DRAWN AEROBIC AND ANAEROBIC 6CC  Final   Culture PENDING  Incomplete   Report Status PENDING  Incomplete     Scheduled Meds: . aspirin EC  81 mg Oral Daily  .  ceFAZolin (ANCEF) IV  2 g Intravenous 3 times per day  . dexamethasone  4 mg Oral Daily  . docusate sodium  100 mg Oral BID  . feeding supplement (ENSURE ENLIVE)  237 mL Oral TID BM  . metoprolol tartrate  25 mg Oral 4 times per day  . prochlorperazine  2.5 mg Oral TID  . senna  1 tablet Oral BID   Continuous Infusions: . sodium chloride 75 mL/hr at 05/28/15 1741     Carleta Woodrow, DO  [facility] Pager [phone]  If 7PM-7AM, please contact night-coverage [email or web address] Password [email or web address] 05/28/2015, 7:56 PM   LOS: 4 days

## 2015-05-29 ENCOUNTER — Inpatient Hospital Stay (HOSPITAL_COMMUNITY): Payer: Medicare Other

## 2015-05-29 DIAGNOSIS — Z66 Do not resuscitate: Secondary | ICD-10-CM

## 2015-05-29 DIAGNOSIS — R109 Unspecified abdominal pain: Secondary | ICD-10-CM | POA: Insufficient documentation

## 2015-05-29 DIAGNOSIS — E872 Acidosis, unspecified: Secondary | ICD-10-CM

## 2015-05-29 DIAGNOSIS — Z515 Encounter for palliative care: Secondary | ICD-10-CM

## 2015-05-29 DIAGNOSIS — R112 Nausea with vomiting, unspecified: Secondary | ICD-10-CM | POA: Insufficient documentation

## 2015-05-29 DIAGNOSIS — R1084 Generalized abdominal pain: Secondary | ICD-10-CM

## 2015-05-29 LAB — CBC
HCT: 38.6 % (ref 36.0–46.0)
HEMOGLOBIN: 13.2 g/dL (ref 12.0–15.0)
MCH: 29.4 pg (ref 26.0–34.0)
MCHC: 34.2 g/dL (ref 30.0–36.0)
MCV: 86 fL (ref 78.0–100.0)
Platelets: 302 10*3/uL (ref 150–400)
RBC: 4.49 MIL/uL (ref 3.87–5.11)
RDW: 16.6 % — ABNORMAL HIGH (ref 11.5–15.5)
WBC: 20.5 10*3/uL — AB (ref 4.0–10.5)

## 2015-05-29 LAB — BASIC METABOLIC PANEL
ANION GAP: 11 (ref 5–15)
BUN: 17 mg/dL (ref 6–20)
CALCIUM: 8.3 mg/dL — AB (ref 8.9–10.3)
CO2: 13 mmol/L — AB (ref 22–32)
CREATININE: 0.48 mg/dL (ref 0.44–1.00)
Chloride: 113 mmol/L — ABNORMAL HIGH (ref 101–111)
GFR calc Af Amer: >60 mL/min (ref >60)
GLUCOSE: 129 mg/dL — AB (ref 65–99)
Potassium: 3.8 mmol/L (ref 3.5–5.1)
Sodium: 137 mmol/L (ref 135–145)

## 2015-05-29 MED ORDER — MORPHINE SULFATE (PF) 2 MG/ML IV SOLN: 2.0000 mg | INTRAVENOUS | Status: DC | PRN

## 2015-05-29 MED ORDER — SODIUM CHLORIDE 0.9 % IV SOLN
INTRAVENOUS | Status: DC
  Administered 2015-05-29: 16:00:00 via INTRAVENOUS

## 2015-05-29 MED ORDER — LORAZEPAM 2 MG/ML IJ SOLN: 1.0000 mg | INTRAMUSCULAR | Status: DC | PRN

## 2015-05-29 MED ORDER — SODIUM BICARBONATE 8.4 % IV SOLN
INTRAVENOUS | Status: DC
  Administered 2015-05-29: 09:00:00 via INTRAVENOUS
  Filled 2015-05-29: qty 150

## 2015-05-29 MED ORDER — MORPHINE SULFATE (PF) 2 MG/ML IV SOLN
2.0000 mg | INTRAVENOUS | Status: DC | PRN
  Administered 2015-05-29: 2 mg via INTRAVENOUS
  Filled 2015-05-29: qty 1

## 2015-05-29 MED ORDER — BISACODYL 10 MG RE SUPP
10.0000 mg | Freq: Once | RECTAL | Status: AC
  Administered 2015-05-29: 10 mg via RECTAL
  Filled 2015-05-29: qty 1

## 2015-05-29 MED ORDER — BISACODYL 10 MG RE SUPP: 10.0000 mg | Freq: Every day | RECTAL | Status: DC | PRN

## 2015-05-29 NOTE — Progress Notes (Signed)
Spoke with Minna Merritts pt's son [phone], Minna Merritts wants pt to go to [place] and asked if pt could be evaluation for Hospice. CSW to follow.

## 2015-05-29 NOTE — Consult Note (Signed)
Consultation Note Date: 05/29/2015   Patient Name: [name]  DOB: Jul 23, 1924  MRN: [id number]  Age / Sex: 82 y.o., female  PCP: Lona Kettle, MD Referring Physician: Orson Eva, MD  Reason for Consultation: Establishing goals of care, Hospice Evaluation, Inpatient hospice referral, Non pain symptom management, Pain control and Psychosocial/spiritual support  Clinical Assessment/Narrative:  [name] is a 82 y.o. 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   Continued physical, functional and cognitive decline.  Heavy symptom burden 2/2 to nausea, today desire is to shift to a full comfort path, no life prolonging measures, tolerateing only sips   This NP Wadie Lessen reviewed medical records, received report from team, assessed the patient and then meet at the patient's bedside along with her daughter/Stephanie and Grandson to discuss prognosis, GOC, EOL wishes disposition and options.     Family is hopeful for residential hospice  A discussion was had today regarding advanced directives.  Concepts specific to artifical  hydration, continued IV antibiotics and rehospitalization was had.   Values and goals of care important to patient and family were attempted to be elicited.  Questions and concerns addressed.   Family encouraged to call with questions or concerns.  PMT will continue to support holistically.   SUMMARY OF RECOMMENDATIONS  -focus on comfort, no life prolonging measures -hopeful for hospice facility for EOL Care, will write for choice -symptom managment to enhance comfort  Code Status/Advance Care Planning:  DNR      Code Status Orders         Start     Ordered   05/25/15 1434  Do not attempt resuscitation (DNR)   Continuous    Question Answer Comment  In the event of cardiac or respiratory ARREST Do not call a "code blue"   In the event of cardiac or respiratory ARREST Do not perform Intubation, CPR, defibrillation or ACLS   In the event of cardiac or respiratory ARREST Use medication by any route, position, wound care, and other measures to relive pain and suffering. May use oxygen, suction and manual treatment of airway obstruction as needed for comfort.      05/25/15 1434    Code Status History    Date Active Date Inactive Code Status Order ID Comments User Context   05/24/2015 12:06 AM 05/25/2015  8:58 AM DNR [id number]  Vianne Bulls, MD ED   05/07/2015  3:14 PM 05/11/2015  7:55 PM DNR [id number]  Bonnielee Haff, MD Inpatient   03/23/2014 10:52 AM 03/29/2014  2:50 PM Full Code [id number]  Armandina Gemma, MD Inpatient   07/09/2013  5:47 PM 07/15/2013  7:34 PM Full Code [id number]  Armandina Gemma, MD Inpatient   07/06/2013 12:15 PM 07/09/2013  5:47 PM Full Code [id number]  Velvet Bathe, MD Inpatient    Advance Directive Documentation        Most Recent Value   Type of Advance Directive  Out of facility DNR (pink MOST or yellow form)   Pre-existing out of facility DNR order (yellow form or pink MOST form)  Yellow form placed in chart (order not valid for inpatient use), Pink MOST form placed in chart (order not valid for inpatient use)   "MOST" Form in Place?  Symptom Management:   Nausea: Zofran and Ativan  Pain: cancer related- Morphine IV prn  Palliative Prophylaxis:    Aspiration, Frequent Pain Assessment and Oral Care  Additional Recommendations (Limitations, Scope, Preferences)   Full Comfort Care   Psycho-social/Spiritual:  Support System: Strong Desire for further Chaplaincy support:yes Additional Recommendations: Education on Hospice and Grief/Bereavement Support  Prognosis: < 2 weeks  Discharge  Planning: Hospice facility, will write for choice    Chief Complaint/ Primary Diagnoses: Present on Admission:  . Essential hypertension . Left hemiparesis ([place]) . Metastasis to brain ([place]) . 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 UTI (lower urinary tract infection) . Sepsis ([place]) . Demand ischemia ([place])  I have reviewed the medical record, interviewed the patient and family, and examined the patient. The following aspects are pertinent.  Past Medical History  Diagnosis Date  . Hypertension   . Hyperlipidemia   . Arthritis   . Cancer [facility])     colon   Social History   Social History  . Marital Status: Widowed    Spouse Name: N/A  . Number of Children: N/A  . Years of Education: N/A   Social History Main Topics  . Smoking status: Never Smoker   . Smokeless tobacco: Never Used  . Alcohol Use: No  . Drug Use: No  . Sexual Activity: No   Other Topics Concern  . None   Social History Narrative   Family History  Problem Relation Age of Onset  . Lung cancer Sister    Scheduled Meds: . dexamethasone  4 mg Oral Daily  . feeding supplement (ENSURE ENLIVE)  237 mL Oral TID BM  . prochlorperazine  2.5 mg Oral TID   Continuous Infusions: . sodium chloride     PRN Meds:.acetaminophen, bisacodyl, LORazepam, morphine injection, nitroGLYCERIN, ondansetron (ZOFRAN) IV Medications Prior to Admission:  Prior to Admission medications   Medication Sig Start Date End Date Taking? Authorizing Provider  acetaminophen (TYLENOL) 650 MG CR tablet Take 650 mg by mouth every 8 (eight) hours as needed for pain.   Yes Historical Provider, MD  amLODipine (NORVASC) 5 MG tablet Take 5 mg by mouth daily.  02/01/13  Yes Historical Provider, MD  aspirin EC 81 MG tablet Take 81 mg by mouth daily.   Yes Historical Provider, MD  dexamethasone (DECADRON) 4 MG tablet Take 1 tablet (4 mg total) by mouth 2 (two) times daily at 10 am and 4 pm. 05/11/15  Yes Orson Eva, MD  Morphine Sulfate (ROXANOL PO) Take 5 mLs by mouth every 4 (four) hours as needed (pain).   Yes Historical Provider, MD  prochlorperazine (COMPAZINE) 5 MG tablet Take 1 tablet (5 mg total) by mouth every 6 (six) hours as needed for nausea or vomiting. Patient taking differently: Take 2.5 mg by mouth 3 (three) times daily.  09/22/13  Yes Owens Shark, NP  senna (SENOKOT) 8.6 MG tablet Take 1 tablet by mouth 2 (two) times daily.   Yes Historical Provider, MD  traMADol (ULTRAM) 50 MG tablet Take 1 tablet (50 mg total) by mouth every 6 (six) hours as needed (pain). 05/11/15  Yes Orson Eva, MD  fluocinonide cream (LIDEX) [id number] % Apply 1 application topically 2 (two) times daily as needed (rash).  03/05/15   Historical Provider, MD  HYDROcodone-acetaminophen (NORCO/VICODIN) 5-325 MG tablet Take 1 tablet by mouth every 6 (six) hours as needed for moderate pain. Patient not taking: Reported on 05/23/2015 05/11/15   Orson Eva, MD  LORazepam (ATIVAN) 0.5 MG tablet Take 0.5 tablets (0.25 mg total) by mouth every 6 (six) hours as needed for anxiety. 05/11/15   Orson Eva, MD  ondansetron (ZOFRAN) 8 MG tablet Take 1 tablet (8 mg total) by mouth every 8 (eight) hours as needed for nausea or vomiting. 05/01/15   Ladell Pier, MD   No Known Allergies  Review of Systems  Constitutional: Positive for activity change and fatigue.  Gastrointestinal: Positive for nausea and vomiting.    Physical Exam  Constitutional: She appears lethargic. She appears cachectic. She appears ill.  Cardiovascular: Tachycardia present.   Respiratory: She has decreased breath sounds in the right lower field and the left lower field.  Neurological: She appears lethargic.  Skin: Skin is warm and dry.    Vital Signs: BP 108/68 mmHg  Pulse 93  Temp(Src) 97.6 F (36.4 C) (Oral)  Resp 20  Ht 5\' 4"  (1.626 m)  Wt 56.5 kg (124 lb 9 oz)  BMI 21.37 kg/m2  SpO2 99%  SpO2: SpO2: 99 % O2 Device:SpO2: 99 % O2 Flow Rate: .O2 Flow  Rate (L/min): 1 L/min  IO: Intake/output summary:  Intake/Output Summary (Last 24 hours) at 05/29/15 1553 Last data filed at 05/29/15 1220  Gross per 24 hour  Intake   1000 ml  Output    350 ml  Net    650 ml    LBM: Last BM Date: 05/25/15 (MD aware. Stool softeners given) Baseline Weight: Weight: 44.453 kg (98 lb) Most recent weight: Weight: 56.5 kg (124 lb 9 oz)      Palliative Assessment/Data:    Additional Data Reviewed:  CBC:    Component Value Date/Time   WBC 20.5* 05/29/2015 0530   WBC 6.9 05/07/2015 1129   HGB 13.2 05/29/2015 0530   HGB 12.5 05/07/2015 1129   HCT 38.6 05/29/2015 0530   HCT 37.8 05/07/2015 1129   PLT 302 05/29/2015 0530   PLT 452* 05/07/2015 1129   MCV 86.0 05/29/2015 0530   MCV 84.4 05/07/2015 1129   NEUTROABS 21.2* 05/23/2015 2120   NEUTROABS 5.0 05/07/2015 1129   LYMPHSABS 0.6* 05/23/2015 2120   LYMPHSABS 1.0 05/07/2015 1129   MONOABS 0.9 05/23/2015 2120   MONOABS 0.6 05/07/2015 1129   EOSABS 0.0 05/23/2015 2120   EOSABS 0.2 05/07/2015 1129   BASOSABS 0.0 05/23/2015 2120   BASOSABS 0.0 05/07/2015 1129   Comprehensive Metabolic Panel:    Component Value Date/Time   NA 137 05/29/2015 0530   NA 129* 05/07/2015 1129   K 3.8 05/29/2015 0530   K 3.4* 05/07/2015 1129   CL 113* 05/29/2015 0530   CO2 13* 05/29/2015 0530   CO2 23 05/07/2015 1129   BUN 17 05/29/2015 0530   BUN 9.1 05/07/2015 1129   CREATININE 0.48 05/29/2015 0530   CREATININE 0.8 05/07/2015 1129   GLUCOSE 129* 05/29/2015 0530   GLUCOSE 86 05/07/2015 1129   CALCIUM 8.3* 05/29/2015 0530   CALCIUM 10.0 05/07/2015 1129   AST 19 05/25/2015 0427   AST 40* 05/07/2015 1129   ALT 20 05/25/2015 0427   ALT 41 05/07/2015 1129   ALKPHOS 68 05/25/2015 0427   ALKPHOS 177* 05/07/2015 1129   BILITOT 0.3 05/25/2015 0427   BILITOT 0.46 05/07/2015 1129   PROT 4.9* 05/25/2015 0427   PROT 7.6 05/07/2015 1129   ALBUMIN 1.8* 05/25/2015 0427   ALBUMIN 3.5 05/07/2015 1129   Discussed  with Dr Tat  Time In: 1600 Time Out: 1715 Time Total: 75 min Greater  than 50%  of this time was spent counseling and coordinating care related to the above assessment and plan.  Signed by: Wadie Lessen, NP  Knox Royalty, NP  05/29/2015, 3:53 PM  Please contact Palliative Medicine Team phone at [phone] for questions and concerns.

## 2015-05-29 NOTE — Progress Notes (Signed)
The patient had increasing abdominal pain and vomiting earlier today. Abdominal x-ray suggested possible small bowel obstruction. I contacted the patient's daughter and also spoke with the patient regarding the new findings as well as providing an update on the patient's current medical condition. Given her metastatic cancer and current complications including her bacteremia, atrial fibrillation, and now possible small bowel obstruction, decision was made to transition the patient's focus of care to that a full comfort. Presently, the patient is hemodynamically stable. I plan to continue intravenous cefazolin and metoprolol for now until confirmation is obtained that she has been accepted for residential hospice. The patient's daughter has requested [facility] transfer to [facility].  Overall prognosis less than 2 weeks.  Orson Eva, DO

## 2015-05-29 NOTE — Progress Notes (Signed)
PROGRESS NOTE  [name] [id number] DOB: March 19, 1924 DOA: 05/23/2015 PCP:  Melinda Crutch, MD  Brief History 82 y.o. 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 At baseline, the patient has mild cognitive deficit, but is able to express her needs and recognize her family. However, on the morning of admission, she reportedly called her son at approximately 10 AM to report crushing chest pain. He reportedly informed SNF staff of this complaint. When he went to visit her later in the afternoon, she was unable to recognize him and unable to give any information about her chest pain or other symptoms. She was noted to be febrile to 100.31F and a foul odor to the urine was also reported.   The patient was recently discharged to [facility] from the hospital on 05/11/2015 after suffering hemiparesis secondary to brain metastasis from her colon cancer. After admission, the patient was noted to have MSSA bacteremia. Antibiotics were ultimately narrowed to cefazolin. Repeat blood cultures were ordered 05/28/2015. The patient has progressive nongapped metabolic acidosis likely resulting in her sensation of dyspnea. Suspect this may be due to her L-hydronephrosis resulting in some degree of tubular dysfunction and bicarbonate reabsorption. As a result, the patient was started on bicarbonate drip. Palliative medicine was consulted on 05/29/2015.   Assessment/Plan: Sepsis secondary to UTI (MSSA) and Bacteremia--MSSA - Meets sepsis criteria on admission with fever, tachycardia, leukocytosis, elevated lactate, grossly positive UA  - Blood and urine cultures--MSSA - pt does not have any foreign bodies, no recent PICC or port -abx per ID-->signed off -05/28/15--d/c vanco, start cefazolin -3/15 and 3/17 blood cultures  positive -Chest x-ray negative for infiltrates -plan intravenous cefazolin for 14 days beyond the first negative culture -05/28/2015--blood culture--pending -Plan to place a PICC line after 05/28/2015 blood culture remains negative -3/21--do not feel CBC is correct as pt has increased 3 grams of hgb Afib with RVR -05/25/15--echo EF 55-60%, moderate AI, HK of basalinferior myocardium -?due to PE-->CTA chest with elevated D-Dimer--neg for PE -pt became bradycardic with diltiazem drip-->d/c -05/25/15--initially placed on metoprolol--hold due to bradycardia now with 6 second pause -cardiology following--signed off 05/28/2015 -05/26/2015--patient developed RVR 120s--restart her metoprolol--increased to 25 mg q 6 hrs-->better controlled -Not an anticoagulation candidate secondary to brain metastasis and overall poor prognosis Dyspnea -05/25/2015 CT angiogram chest negative for pulmonary embolus -05/29/2015--patient feels "can't catch breath"--likely due to metabolic acidosis--> check chest x-ray -No respiratory distress. Oxygen saturation 99% on room air. -start bicarbonate drip Elevated troponin/Chest pain -Likely demand ischemia -No chest pain presently -EKG--sinus rhythm with nonspecific T-wave inversion -AppreciateCardiology consult -05/26/2015--CTA chest negative for PE, bilateral small pleural effusions Left Hydronephrosis -suspect this may be due to her adenopathy -renal function remains preserved -consulted urology--discussed with Dr. Alyce Pagan pt has preserved renal function and pt is NOT to receive any further chemo or radiation from oncologic standpoint, performing surgery and placing another foreign body in the setting of sepsis is not indicated at this time Acute metabolic encephalopathy -Secondary to infectious process -At baseline, the patient is able to converse and clarify her needs -improved, back to baseline Abdominal pain -CT abdomen in setting of  sepsis--Interval progression of intrahepatic and common bile duct dilatation. A stricture involving the distal common bile duct is Suspected. 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 See problem #1.  -On need to follow-up on duodenal biopsy pathology results from [facility]. Per oncology. -Minimal CBD obstruction and duodenal partial obstruction-- biopsies from [facility]-->showed adenocarcinoma from EUS -Appreciate Dr. Ashok Cordia consult--case discussed with him Left hemiparesis secondary to metastatic colon cancer -metastatic disease noted on MRI head which shows a 19 mm high right frontoparietal mass consistent with solitary metastases. Moderate vasogenic edema without shift.  -Continue po Decadron -pt had one SRS fraction on 05/11/15 -pathology report from duodenal biopsy at [facility] adenocarcinoma  Hypertension -Controlled with metoprolol tartrate -Discontinued home amlodipine -Hydralazine as needed. -discontinue losartan HCTZ-->BP remains stable -HCTZ contributing to hyponatremia hyponatremia - likely due to volume depletion from poor oral intake -IV fluids-->improved nonGapped metabolic acidosis -trend -Suspect this may be due to left hydronephrosis causing some degree of renal tubular dysfunction/?RTA -Start bicarbonate drip--likely will be able to transition to oral bicarbonate at some point. Constipation -Continue senna and Colace -Patient does not want enema or suppository -addLactulose   Family Communication: Son updated at beside 05/25/15 Disposition Plan: SNF when repeat blood cultures are neg x 5 days   Procedures/Studies: Dg Chest 2 View  05/23/2015   CLINICAL DATA:  Altered mental status, weakness and fever. History of colon cancer. EXAM: CHEST  2 VIEW COMPARISON:  Chest x-ray dated 07/06/2013. FINDINGS: Mild cardiomegaly is stable. Atherosclerotic changes again noted at the aortic arch. Vague opacity at the left costophrenic angle is grossly stable, most compatible with chronic atelectasis or chronic pleural thickening. Lungs are otherwise clear. No evidence of pneumonia. Mild degenerative change again noted within the thoracic spine. Degenerative changes also noted at each shoulder. No acute- appearing osseous abnormality. IMPRESSION: No active cardiopulmonary disease. Electronically Signed   By: Franki Cabot M.D.   On: 05/23/2015 21:41   Ct Head Wo Contrast  05/23/2015  CLINICAL DATA:  Acute onset of altered level of consciousness. Fever and malodorous urine. Nausea and vomiting. Initial encounter. EXAM: CT HEAD WITHOUT CONTRAST TECHNIQUE: Contiguous axial images were obtained from the base of the skull through the vertex without intravenous contrast. COMPARISON:  MRI of the brain performed 05/10/2015 FINDINGS: A 1.9 cm right parasagittal mass is again noted at the convexity, with surrounding decreased attenuation within the white matter. No midline shift is seen. Mild periventricular white matter change likely reflects small vessel ischemic microangiopathy. There is no evidence of acute intra or extra-axial hemorrhage. There is no evidence for acute infarct. The brainstem and fourth ventricle are within normal limits. The basal ganglia are unremarkable in appearance. The cerebral hemispheres demonstrate grossly normal gray-white differentiation. No mass effect or midline shift is seen. There is no evidence of fracture; visualized osseous structures are unremarkable in appearance. The orbits are within normal limits. The paranasal sinuses and mastoid air cells are well-aerated. No significant soft tissue abnormalities are seen. IMPRESSION: 1. 1.9 cm right  parasagittal mass at the convexity, with surrounding decreased attenuation within the white matter. No midline shift seen. 2. Mild small vessel ischemic microangiopathy. Electronically Signed   By: Garald Balding M.D.   On: 05/23/2015 21:58   Ct Angio Chest Pe W/cm &/or Wo Cm  05/25/2015  CLINICAL DATA:  Elevated D-dimer. Metastatic colon cancer. New atrial fibrillation. EXAM: CT ANGIOGRAPHY CHEST WITH CONTRAST TECHNIQUE: Multidetector CT imaging of the  chest was performed using the standard protocol during bolus administration of intravenous contrast. Multiplanar CT image reconstructions and MIPs were obtained to evaluate the vascular anatomy. CONTRAST:  182mL OMNIPAQUE IOHEXOL 350 MG/ML SOLN COMPARISON:  Mid chest radiographs dated 05/23/2015 and chest CT dated 03/30/2015. FINDINGS: Mediastinum/Lymph Nodes: No pulmonary emboli or thoracic aortic dissection identified. No masses or pathologically enlarged lymph nodes identified. Lungs/Pleura: Multiple bilateral lung nodules. The largest is a 10 mm upper lobe nodule on image number 25 of series 7. There are multiple additional smaller nodules throughout both lungs. There are also small bilateral pleural effusions and mild bilateral lower lobe atelectasis. Upper abdomen: Progressive biliary ductal dilatation. The left hepatic duct currently measures 14 mm in diameter. This previously measured 6 mm. Musculoskeletal: Marked right shoulder degenerative changes and incompletely included left shoulder degenerative changes. Extensive, large calcified loose bodies are again demonstrated inferior and medial to the right shoulder. No evidence of bony metastatic disease. Review of the MIP images confirms the above findings. IMPRESSION: 1. No pulmonary emboli. 2. Small bilateral pleural effusions. 3. Mild bilateral lower lobe atelectasis. 4. Multiple bilateral lung nodules compatible with lung metastases. 5. Biliary obstruction with a significant interval increase in size of  the intrahepatic ducts. This suggest at the pancreatic head abnormality seen on the previous CTA may have represented a pancreatic neoplasm. This is not included on the current examination. Electronically Signed   By: Claudie Revering M.D.   On: 05/25/2015 18:53   Mr [name] [id number] Contrast  05/10/2015  CLINICAL DATA:  Colon cancer. LEFT hemiparesis, leg greater than arm. Confusion. SRS planning EXAM: MRI HEAD WITHOUT AND WITH CONTRAST TECHNIQUE: Multiplanar, multiecho pulse sequences of the brain and surrounding structures were obtained without and with intravenous contrast. CONTRAST:  8mL MULTIHANCE GADOBENATE DIMEGLUMINE 529 MG/ML IV SOLN COMPARISON:  1.5 tesla MR 05/08/2015. FINDINGS: Redemonstrated is 14 x 18 x 18 mm (R-L x A-P x C-C) superficial, but intra-axial mass, RIGHT posterior frontal cortex, parasagittal location with surrounding vasogenic edema. There is avid postcontrast enhancement. Marked surrounding vasogenic edema with no midline shift but slight depression of the RIGHT lateral ventricle. No additional brain lesions. No osseous lesions. Extracranial soft tissues unremarkable. No significant change from previous exam. IMPRESSION: Superficial, but intra-axial, RIGHT posterior frontal parasagittal metastasis. This appears to be a solitary lesion, approximately 2 cm. Extensive vasogenic edema but no midline shift. Electronically Signed   By: Staci Righter M.D.   On: 05/10/2015 15:32   Mr [name] [id number] Contrast  05/08/2015  CLINICAL DATA:  Left hemi paresis. History of colon cancer. Metastasis versus infarct. EXAM: MRI HEAD WITHOUT AND WITH CONTRAST TECHNIQUE: Multiplanar, multiecho pulse sequences of the brain and surrounding structures were obtained without and with intravenous contrast. CONTRAST:  10 cc MultiHance intravenous COMPARISON:  None. FINDINGS: Calvarium and upper cervical spine: No definitive skeletal metastasis. Orbits: Bilateral cataract resection.  Negative for mass Sinuses and Mastoids:  Clear. Brain: There is an enhancing intra-axial mass in the parasagittal right frontal parietal lobe along the upper motor strip measuring 19 mm. Moderate vasogenic edema without shift or herniation. No other masses identified. MR signal shows dense cellularity. No definitive hemorrhagic component. No acute infarct, hemorrhage, hydrocephalus, or major vessel occlusion. Metastasis is unexpected finding, but to accelerate care these results will be called to the ordering clinician or representative by the Radiologist Assistant, and communication documented in the PACS or zVision Dashboard. IMPRESSION: 19 mm high right frontoparietal mass consistent with solitary metastasis. Moderate vasogenic edema  without shift. Electronically Signed   By: Monte Fantasia M.D.   On: 05/08/2015 11:40   Ct Abdomen Pelvis W Contrast  05/24/2015  CLINICAL DATA:  Abdominal pain.  Sepsis.  History of colon cancer. EXAM: CT ABDOMEN AND PELVIS WITH CONTRAST TECHNIQUE: Multidetector CT imaging of the abdomen and pelvis was performed using the standard protocol following bolus administration of intravenous contrast. CONTRAST:  37mL OMNIPAQUE IOHEXOL 300 MG/ML SOLN, 16mL OMNIPAQUE IOHEXOL 300 MG/ML SOLN COMPARISON:  03/30/2015 FINDINGS: Lower chest: Small pleural effusions are noted left greater night. There is overlying compressive type atelectasis and consolidation involving the left lower lobe. Nodule in the lingula is identified measuring 7 mm, image number 1 of series 4. New from previous exam. New right lower lobe pulmonary nodule is identified measuring 4 mm, image 12 of series 4. Also new from previous exam is a lingular nodule measuring 4 mm, image 2 of series 4. Hepatobiliary: No focal liver abnormality identified. There has been interval progression of intrahepatic bile duct dilatation. The common bile duct measures 1.6 cm, image number 39 of series 6. On the previous exam this measured 0.9 cm. Distal common bile duct stricture is  suspected, image 39 of series 6. Pancreas: Mild diffuse edema of the pancreas. The appearance is similar to previous exam. Spleen: Calcified granulomas identified within the spleen. Adrenals/Urinary Tract: Scratch set bilateral adrenal nodules are again identified. There is left-sided hydronephrosis, progressed from previous exam. Normal appearance of the right kidney. The urinary bladder appears within normal limits. Stomach/Bowel: The stomach and the small bowel loops have a normal course and caliber. No bowel obstruction. No dilated loops of small or large bowel loops identified at this time. Vascular/Lymphatic: Calcified atherosclerotic disease involves the abdominal aorta. No aneurysm. Index portacaval node measures 1 cm, image 30 of series 5. Previously 1.3 cm. Gastrohepatic ligament lymph node measures 9 mm, image 29 of series 5. Previously 1.2 cm. The index left periaortic node measures 1.5 cm, image 38 of series 5. Previously 1.1 cm. Reproductive: The uterus and the adnexal structures are unremarkable. Other: Small fat containing hernias are identified at the previous ostomy site within the right lower quadrant ventral abdominal wall, image 51 of series 5. There is a small amount of adjacent fluid within the ventral abdominal wall measuring 2.7 by 0.9 cm, image 51 of series 5. No significant free fluid within the abdomen or pelvis and no fluid collections identified. Musculoskeletal: Spondylosis noted within the lumbar spine. No aggressive lytic or sclerotic bone lesions. IMPRESSION: 1. Interval progression of intrahepatic and common bile duct dilatation. A stricture involving the distal common bile duct is suspected. Underlying mass lesion cannot be excluded. 2. Progressive dilatation of the left renal collecting system to the level of the UPJ. Findings are compatible with hydronephrosis. No obstructing stone or mass noted. 3. Increase in size of left periaortic lymph node. Portacaval and gastrohepatic  ligament lymph nodes have decreased in size in the interval. 4. New pulmonary nodules are identified within the lungs, suspicious for metastatic disease. Electronically Signed   By: Kerby Moors M.D.   On: 05/24/2015 13:11         Subjective: Patient feels some shortness of breath. Denies any nausea, vomiting, diarrhea. Complains of constipation. Denies any headache, neck pain. Denies any hematochezia, melena.  Objective: Filed Vitals:   05/28/15 2049 05/28/15 2257 05/29/15 0630 05/29/15 0645  BP: 129/76 140/78 126/85 126/85  Pulse: 81 81 82 108  Temp: 97.7 F (36.5 C)  97.5  F (36.4 C)   TempSrc: Oral  Axillary   Resp: 18  18   Height:      Weight:   56.5 kg (124 lb 9 oz)   SpO2: 98%  98%     Intake/Output Summary (Last 24 hours) at 05/29/15 0756 Last data filed at 05/29/15 0700  Gross per 24 hour  Intake   1000 ml  Output    300 ml  Net    700 ml   Weight change: -0.3 kg (-10.6 oz) Exam:   General:  Pt is alert, follows commands appropriately, not in acute distress  HEENT: No icterus, No thrush, No neck mass, [place]/AT  Cardiovascular: RRR, S1/S2, no rubs, no gallops  Respiratory: Bibasilar crackles. No wheeze. Good air movement.  Abdomen: Soft/+BS, non tender, non distended, no guarding  Extremities: 1+LE edema, No lymphangitis, No petechiae, No rashes, no synovitis  Data Reviewed: Basic Metabolic Panel:  Recent Labs Lab 05/25/15 0427 05/26/15 0539 05/27/15 0544 05/28/15 0511 05/29/15 0530  NA 140 143 143 140 137  K 3.5 4.6 5.1 4.3 3.8  CL 113* 115* 117* 115* 113*  CO2 19* 15* 15* 15* 13*  GLUCOSE 123* 118* 131* 141* 129*  BUN 20 19 24* 24* 17  CREATININE 0.51 0.45 0.52 0.47 0.48  CALCIUM 8.0* 8.6* 8.9 8.4* 8.3*  MG  --  1.9 2.1  --   --    Liver Function Tests:  Recent Labs Lab 05/23/15 2120 05/25/15 0427  AST 20 19  ALT 20 20  ALKPHOS 80 68  BILITOT 0.9 0.3  PROT 6.0* 4.9*  ALBUMIN 2.5* 1.8*   No results for input(s): LIPASE,  AMYLASE in the last 168 hours. No results for input(s): AMMONIA in the last 168 hours. CBC:  Recent Labs Lab 05/23/15 2120  05/25/15 0427 05/26/15 0539 05/27/15 0544 05/28/15 0511 05/29/15 0530  WBC 22.6*  < > 14.7* 14.3* 16.4* 13.0* 20.5*  NEUTROABS 21.2*  --   --   --   --   --   --   HGB 13.3  < > 10.0* 10.8* 11.1* 10.7* 13.2  HCT 39.6  < > 30.2* 33.3* 34.1* 33.5* 38.6  MCV 86.3  < > 87.8 89.0 89.5 89.1 86.0  PLT 245  < > 229 273 336 240 302  < > = values in this interval not displayed. Cardiac Enzymes:  Recent Labs Lab 05/24/15 0037 05/24/15 0607 05/24/15 1405  TROPONINI 0.45* 0.63* 0.59*   BNP: Invalid input(s): POCBNP CBG: No results for input(s): GLUCAP in the last 168 hours.  Recent Results (from the past 240 hour(s))  Culture, blood (Routine X 2) w Reflex to ID Panel     Status: None   Collection Time: 05/23/15  9:20 PM  Result Value Ref Range Status   Specimen Description BLOOD RIGHT HAND  Final   Special Requests IN PEDIATRIC BOTTLE 2CC  Final   Culture  Setup Time   Final    GRAM POSITIVE COCCI IN CLUSTERS PEDIATRIC BOTTLE S. WEST,RN AT 1451 ON [id number] BY Rhea Bleacher    Culture   Final    STAPHYLOCOCCUS AUREUS SUSCEPTIBILITIES PERFORMED ON PREVIOUS CULTURE WITHIN THE LAST 5 DAYS. Performed at [facility]    Report Status 05/26/2015 FINAL  Final  Culture, blood (Routine X 2) w Reflex to ID Panel     Status: None   Collection Time: 05/23/15  9:35 PM  Result Value Ref Range Status   Specimen Description BLOOD LEFT FOREARM  Final   Special Requests BOTTLES DRAWN AEROBIC AND ANAEROBIC 5CC  Final   Culture  Setup Time   Final    GRAM POSITIVE COCCI IN CLUSTERS IN BOTH AEROBIC AND ANAEROBIC BOTTLES CRITICAL RESULT CALLED TO, READ BACK BY AND VERIFIED WITH: I BOULOUDENE,RN AT [id number] 05/24/15 BY L BENFIELD    Culture   Final    STAPHYLOCOCCUS AUREUS Performed at [facility]    Report Status 05/26/2015 FINAL  Final   Organism ID,  Bacteria STAPHYLOCOCCUS AUREUS  Final      Susceptibility   Staphylococcus aureus - MIC*    CIPROFLOXACIN <=0.5 SENSITIVE Sensitive     ERYTHROMYCIN 0.5 SENSITIVE Sensitive     GENTAMICIN <=0.5 SENSITIVE Sensitive     OXACILLIN 0.5 SENSITIVE Sensitive     TETRACYCLINE <=1 SENSITIVE Sensitive     VANCOMYCIN 1 SENSITIVE Sensitive     TRIMETH/SULFA <=10 SENSITIVE Sensitive     CLINDAMYCIN <=0.25 SENSITIVE Sensitive     RIFAMPIN <=0.5 SENSITIVE Sensitive     Inducible Clindamycin NEGATIVE Sensitive     * STAPHYLOCOCCUS AUREUS  Urine culture     Status: None   Collection Time: 05/23/15  9:55 PM  Result Value Ref Range Status   Specimen Description URINE, CATHETERIZED  Final   Special Requests Normal  Final   Culture   Final    >=100,000 COLONIES/mL STAPHYLOCOCCUS AUREUS Performed at [facility]    Report Status 05/26/2015 FINAL  Final   Organism ID, Bacteria STAPHYLOCOCCUS AUREUS  Final      Susceptibility   Staphylococcus aureus - MIC*    CIPROFLOXACIN <=0.5 SENSITIVE Sensitive     GENTAMICIN <=0.5 SENSITIVE Sensitive     NITROFURANTOIN <=16 SENSITIVE Sensitive     OXACILLIN 0.5 SENSITIVE Sensitive     TETRACYCLINE <=1 SENSITIVE Sensitive     VANCOMYCIN 1 SENSITIVE Sensitive     TRIMETH/SULFA <=10 SENSITIVE Sensitive     CLINDAMYCIN <=0.25 SENSITIVE Sensitive     RIFAMPIN <=0.5 SENSITIVE Sensitive     Inducible Clindamycin NEGATIVE Sensitive     * >=100,000 COLONIES/mL STAPHYLOCOCCUS AUREUS  MRSA PCR Screening     Status: None   Collection Time: 05/24/15  7:02 PM  Result Value Ref Range Status   MRSA by PCR NEGATIVE NEGATIVE Final    Comment:        The GeneXpert MRSA Assay (FDA approved for NASAL specimens only), is one component of a comprehensive MRSA colonization surveillance program. It is not intended to diagnose MRSA infection nor to guide or monitor treatment for MRSA infections.   Culture, blood (routine x 2)     Status: None   Collection Time:  05/25/15  6:00 PM  Result Value Ref Range Status   Specimen Description BLOOD RIGHT HAND  Final   Special Requests IN PEDIATRIC BOTTLE 2.5CC  Final   Culture  Setup Time   Final    GRAM POSITIVE COCCI IN CLUSTERS PEDIATRIC BOTTLE CALLED TO SEAY,K RN 05/26/15 1754 [place] BY K WOOLLEN    Culture   Final    STAPHYLOCOCCUS AUREUS SUSCEPTIBILITIES PERFORMED ON PREVIOUS CULTURE WITHIN THE LAST 5 DAYS. Performed at [facility]    Report Status 05/28/2015 FINAL  Final  Culture, blood (routine x 2)     Status: None   Collection Time: 05/25/15  6:08 PM  Result Value Ref Range Status   Specimen Description BLOOD RIGHT HAND  Final   Special Requests IN PEDIATRIC BOTTLE [place]  Final  Culture  Setup Time   Final    GRAM POSITIVE COCCI IN CLUSTERS PEDIATRIC BOTTLE CALLED TO SEAY,K RN 05/26/15 1755 [place]    Culture   Final    STAPHYLOCOCCUS AUREUS SUSCEPTIBILITIES PERFORMED ON PREVIOUS CULTURE WITHIN THE LAST 5 DAYS. Performed at [facility]    Report Status 05/28/2015 FINAL  Final  Culture, blood (Routine X 2) w Reflex to ID Panel     Status: None (Preliminary result)   Collection Time: 05/28/15  5:14 AM  Result Value Ref Range Status   Specimen Description   Final    BLOOD RIGHT HAND Performed at [facility]    Special Requests BOTTLES DRAWN AEROBIC AND ANAEROBIC [facility]  Final   Culture PENDING  Incomplete   Report Status PENDING  Incomplete     Scheduled Meds: . aspirin EC  81 mg Oral Daily  .  ceFAZolin (ANCEF) IV  2 g Intravenous 3 times per day  . dexamethasone  4 mg Oral Daily  . docusate sodium  100 mg Oral BID  . feeding supplement (ENSURE ENLIVE)  237 mL Oral TID BM  . metoprolol tartrate  25 mg Oral 4 times per day  . prochlorperazine  2.5 mg Oral TID  . senna  1 tablet Oral BID   Continuous Infusions: . sodium chloride 75 mL/hr at 05/29/15 0732     Knolan Simien, DO  [facility] Pager [phone]  If 7PM-7AM, please contact  night-coverage [email or web address] Password [facility] 05/29/2015, 7:56 AM   LOS: 5 days

## 2015-05-29 NOTE — Progress Notes (Signed)
MD paged regarding patient's increasingly distended and firm abdomen. Patient states her pain is better after administration of IV Morphine. Patient only had a smear of a BM after Dulcolax suppository. However, patient now vomiting small amounts of dark brown vomit. MD Tat paged to be made aware. Abd XR and Palliative Care consult ordered. Will continue to monitor closely.

## 2015-05-29 NOTE — Progress Notes (Signed)
CSW received update from MD and recommendation for residential hospice.  CSW met with pt daughter, Colletta Maryland in hallway to discuss.   CSW offered choice for residential hospice placement. Pt daughter chooses [facility]. CSW discussed process of referral and clarified questions.   Pt daughter inquired about [facility] and CSW provided contact information to pt daughter for [facility].   CSW made referral to [facility], [facility].   CSW to continue to follow to assist with residential hospice placement.  Alison Murray, MSW, [facility] [employer] [phone]

## 2015-05-30 NOTE — Progress Notes (Signed)
Nutrition Brief Note  Chart reviewed. Pt now transitioning to comfort care.  No further nutrition interventions warranted at this time.  Please re-consult as needed.   Oleta Gunnoe, MS, RD, LDN Pager: [phone] After Hours Pager: [phone]    

## 2015-05-30 NOTE — Consult Note (Signed)
[facility] the patient at the bedside. She was talking on the phone on arrival. She denies pain or discomfort. She has no difficulty swallowing, per patient. She reports that she had coffee this morning.  She is A&Ox3, Foley catheter in place, draining medium yellow urine.The patient has not required prn's for symptom management over the past 24 hours. Discussed findings and reviewed Medical Record with Dr. Beverlyn Roux. The patient does not currently meet criteria for end of life care at [facility]. Should her clinical picture change, we are available to re-evaluate.  Crystal Glassman RN [facility] [phone]

## 2015-05-30 NOTE — Progress Notes (Signed)
CSW continuing to follow.   CSW received notification from [facility], Erling Conte that [facility] does not have a bed available today.   CSW visited pt room. No family present at bedside.   CSW contacted pt daughter, Colletta Maryland via telephone to notify that [facility] does not have bed available at this time and unknown when bed may become available. CSW discussed that given it is not known when [facility] may become available then need to make referral to secondary options. CSW discussed other options and pt daughter agreeable to referral to [facility] [place].   CSW contacted [facility] and made referral. [facility] liaison, Beverlee Nims will process referral and notify CSW of availability.   CSW to continue to follow for transition to residential hospice.  Alison Murray, MSW, [place] [facility] [phone]

## 2015-05-30 NOTE — Progress Notes (Signed)
PROGRESS NOTE  GRACYN ANCONA [id number] DOB: 05-04-1924 DOA: 05/23/2015 PCP:  Melinda Crutch, MD  Brief History 82 y.o. 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 At baseline, the patient has mild cognitive deficit, but is able to express her needs and recognize her family. However, on the morning of admission, she reportedly called her son at approximately 10 AM to report crushing chest pain. He reportedly informed SNF staff of this complaint. When he went to visit her later in the afternoon, she was unable to recognize him and unable to give any information about her chest pain or other symptoms. She was noted to be febrile to 100.6F and a foul odor to the urine was also reported.   The patient was recently discharged to [facility] from the hospital on 05/11/2015 after suffering hemiparesis secondary to brain metastasis from her colon cancer. After admission, the patient was noted to have MSSA bacteremia. Antibiotics were ultimately narrowed to cefazolin. Repeat blood cultures were ordered 05/28/2015. The patient has progressive nongapped metabolic acidosis likely resulting in her sensation of dyspnea. Suspect this may be due to her L-hydronephrosis resulting in some degree of tubular dysfunction and bicarbonate reabsorption. As a result, the patient was started on bicarbonate drip. Palliative medicine was consulted on 05/29/2015.  I have started taking care of this patient today. She has been transitioned to comfort care only. Currently on Decadron and when necessary morphine, Ativan.    Subjective: Awake and alert. She has no complaints.  Assessment/Plan: Sepsis secondary to UTI (MSSA) and Bacteremia--MSSA - Meets sepsis criteria on admission with fever, tachycardia, leukocytosis, elevated lactate,  grossly positive UA  - Blood and urine cultures--MSSA - pt does not have any foreign bodies, no recent PICC or port -abx per ID-->signed off -05/28/15--d/c vanco, start cefazolin -3/15 and 3/17 blood cultures positive -Chest x-ray negative for infiltrates   Afib with RVR -05/25/15--echo EF 55-60%, moderate AI, HK of basalinferior myocardium -?due to PE-->CTA chest with elevated D-Dimer--neg for PE -pt became bradycardic with diltiazem drip-->d/c -05/25/15--initially placed on metoprolol--hold due to bradycardia now with 6 second pause -cardiology following--signed off 05/28/2015 -05/26/2015--patient developed RVR 120s--restart her metoprolol--increased to 25 mg q 6 hrs-->better controlled -Not an anticoagulation candidate secondary to brain metastasis and overall poor prognosis  Dyspnea -05/25/2015 CT angiogram chest negative for pulmonary embolus -05/29/2015--patient feels "can't catch breath"--likely due to metabolic acidosis--> check chest x-ray -No respiratory distress. Oxygen saturation 99% on room air.   Elevated troponin/Chest pain -Likely demand ischemia -No chest pain presently -EKG--sinus rhythm with nonspecific T-wave inversion -AppreciateCardiology consult -05/26/2015--CTA chest negative for PE, bilateral small pleural effusions  Left Hydronephrosis -suspect this may be due to her adenopathy -renal function remains preserved -consulted urology--discussed with Dr. Alyce Pagan pt has preserved renal function and pt is NOT to receive any further chemo or radiation from oncologic standpoint, performing surgery and placing another foreign body in the setting of sepsis is not indicated at this time  Acute metabolic encephalopathy -Secondary to infectious process -At baseline, the patient is able to converse and clarify her needs -improved, back to baseline  Abdominal pain -CT abdomen in setting of sepsis--Interval progression of intrahepatic and common bile duct  dilatation. A stricture involving the distal common bile duct is Suspected. Progressive L-hydronephrosis -abd pain overall improving -Biliary  stricture is chronic and likely due to carcinomatosis--no further intervention at this time secondary to poor prognosis  stage 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 See problem #1.  -On need to follow-up on duodenal biopsy pathology results from [facility]. Per oncology. -Minimal CBD obstruction and duodenal partial obstruction-- biopsies from [facility]-->showed adenocarcinoma from EUS -Appreciate Dr. Ashok Cordia consult--case discussed with him  Left hemiparesis secondary to metastatic colon cancer -metastatic disease noted on MRI head which shows a 19 mm high right frontoparietal mass consistent with solitary metastases. Moderate vasogenic edema without shift.  -Continue po Decadron -pt had one SRS fraction on 05/11/15 -pathology report from duodenal biopsy at [facility] adenocarcinoma   Hypertension -Controlled with metoprolol tartrate -Discontinued home amlodipine -Hydralazine as needed. -discontinue losartan HCTZ-->BP remains stable -HCTZ contributing to hyponatremia  hyponatremia - likely due to volume depletion from poor oral intake -IV fluids-->improved  nonGapped metabolic acidosis -trend -Suspect this may be due to left hydronephrosis causing some degree of renal tubular dysfunction/?RTA   Constipation -Dulcolax suppository ordered by palliative RN    Family Communication: Son updated at beside 05/25/15 Disposition Plan: SNF when repeat blood cultures are neg x 5 days   Procedures/Studies: Dg Chest 2 View  05/23/2015  CLINICAL DATA:  Altered mental status, weakness and fever. History of colon cancer. EXAM: CHEST  2 VIEW COMPARISON:  Chest x-ray dated 07/06/2013. FINDINGS: Mild cardiomegaly is stable.  Atherosclerotic changes again noted at the aortic arch. Vague opacity at the left costophrenic angle is grossly stable, most compatible with chronic atelectasis or chronic pleural thickening. Lungs are otherwise clear. No evidence of pneumonia. Mild degenerative change again noted within the thoracic spine. Degenerative changes also noted at each shoulder. No acute- appearing osseous abnormality. IMPRESSION: No active cardiopulmonary disease. Electronically Signed   By: Franki Cabot M.D.   On: 05/23/2015 21:41   Ct Head Wo Contrast  05/23/2015  CLINICAL DATA:  Acute onset of altered level of consciousness. Fever and malodorous urine. Nausea and vomiting. Initial encounter. EXAM: CT HEAD WITHOUT CONTRAST TECHNIQUE: Contiguous axial images were obtained from the base of the skull through the vertex without intravenous contrast. COMPARISON:  MRI of the brain performed 05/10/2015 FINDINGS: A 1.9 cm right parasagittal mass is again noted at the convexity, with surrounding decreased attenuation within the white matter. No midline shift is seen. Mild periventricular white matter change likely reflects small vessel ischemic microangiopathy. There is no evidence of acute intra or extra-axial hemorrhage. There is no evidence for acute infarct. The brainstem and fourth ventricle are within normal limits. The basal ganglia are unremarkable in appearance. The cerebral hemispheres demonstrate grossly normal gray-white differentiation. No mass effect or midline shift is seen. There is no evidence of fracture; visualized osseous structures are unremarkable in appearance. The orbits are within normal limits. The paranasal sinuses and mastoid air cells are well-aerated. No significant soft tissue abnormalities are seen. IMPRESSION: 1. 1.9 cm right parasagittal mass at the convexity, with surrounding decreased attenuation within the white matter. No midline shift seen. 2. Mild small vessel ischemic microangiopathy. Electronically  Signed   By: Garald Balding M.D.   On: 05/23/2015 21:58   Ct Angio Chest Pe W/cm &/or Wo Cm  05/25/2015  CLINICAL DATA:  Elevated D-dimer. Metastatic colon cancer. New atrial fibrillation. EXAM: CT ANGIOGRAPHY CHEST WITH CONTRAST TECHNIQUE: Multidetector CT imaging of the chest was performed using the standard protocol during bolus administration  of intravenous contrast. Multiplanar CT image reconstructions and MIPs were obtained to evaluate the vascular anatomy. CONTRAST:  161mL OMNIPAQUE IOHEXOL 350 MG/ML SOLN COMPARISON:  Mid chest radiographs dated 05/23/2015 and chest CT dated 03/30/2015. FINDINGS: Mediastinum/Lymph Nodes: No pulmonary emboli or thoracic aortic dissection identified. No masses or pathologically enlarged lymph nodes identified. Lungs/Pleura: Multiple bilateral lung nodules. The largest is a 10 mm upper lobe nodule on image number 25 of series 7. There are multiple additional smaller nodules throughout both lungs. There are also small bilateral pleural effusions and mild bilateral lower lobe atelectasis. Upper abdomen: Progressive biliary ductal dilatation. The left hepatic duct currently measures 14 mm in diameter. This previously measured 6 mm. Musculoskeletal: Marked right shoulder degenerative changes and incompletely included left shoulder degenerative changes. Extensive, large calcified loose bodies are again demonstrated inferior and medial to the right shoulder. No evidence of bony metastatic disease. Review of the MIP images confirms the above findings. IMPRESSION: 1. No pulmonary emboli. 2. Small bilateral pleural effusions. 3. Mild bilateral lower lobe atelectasis. 4. Multiple bilateral lung nodules compatible with lung metastases. 5. Biliary obstruction with a significant interval increase in size of the intrahepatic ducts. This suggest at the pancreatic head abnormality seen on the previous CTA may have represented a pancreatic neoplasm. This is not included on the current  examination. Electronically Signed   By: Claudie Revering M.D.   On: 05/25/2015 18:53   Mr [name] [id number] Contrast  05/10/2015  CLINICAL DATA:  Colon cancer. LEFT hemiparesis, leg greater than arm. Confusion. SRS planning EXAM: MRI HEAD WITHOUT AND WITH CONTRAST TECHNIQUE: Multiplanar, multiecho pulse sequences of the brain and surrounding structures were obtained without and with intravenous contrast. CONTRAST:  58mL MULTIHANCE GADOBENATE DIMEGLUMINE 529 MG/ML IV SOLN COMPARISON:  1.5 tesla MR 05/08/2015. FINDINGS: Redemonstrated is 14 x 18 x 18 mm (R-L x A-P x C-C) superficial, but intra-axial mass, RIGHT posterior frontal cortex, parasagittal location with surrounding vasogenic edema. There is avid postcontrast enhancement. Marked surrounding vasogenic edema with no midline shift but slight depression of the RIGHT lateral ventricle. No additional brain lesions. No osseous lesions. Extracranial soft tissues unremarkable. No significant change from previous exam. IMPRESSION: Superficial, but intra-axial, RIGHT posterior frontal parasagittal metastasis. This appears to be a solitary lesion, approximately 2 cm. Extensive vasogenic edema but no midline shift. Electronically Signed   By: Staci Righter M.D.   On: 05/10/2015 15:32   Mr [name] [id number] Contrast  05/08/2015  CLINICAL DATA:  Left hemi paresis. History of colon cancer. Metastasis versus infarct. EXAM: MRI HEAD WITHOUT AND WITH CONTRAST TECHNIQUE: Multiplanar, multiecho pulse sequences of the brain and surrounding structures were obtained without and with intravenous contrast. CONTRAST:  10 cc MultiHance intravenous COMPARISON:  None. FINDINGS: Calvarium and upper cervical spine: No definitive skeletal metastasis. Orbits: Bilateral cataract resection.  Negative for mass Sinuses and Mastoids: Clear. Brain: There is an enhancing intra-axial mass in the parasagittal right frontal parietal lobe along the upper motor strip measuring 19 mm. Moderate vasogenic edema without  shift or herniation. No other masses identified. MR signal shows dense cellularity. No definitive hemorrhagic component. No acute infarct, hemorrhage, hydrocephalus, or major vessel occlusion. Metastasis is unexpected finding, but to accelerate care these results will be called to the ordering clinician or representative by the Radiologist Assistant, and communication documented in the PACS or zVision Dashboard. IMPRESSION: 19 mm high right frontoparietal mass consistent with solitary metastasis. Moderate vasogenic edema without shift. Electronically Signed   By: Monte Fantasia  M.D.   On: 05/08/2015 11:40   Ct Abdomen Pelvis W Contrast  05/24/2015  CLINICAL DATA:  Abdominal pain.  Sepsis.  History of colon cancer. EXAM: CT ABDOMEN AND PELVIS WITH CONTRAST TECHNIQUE: Multidetector CT imaging of the abdomen and pelvis was performed using the standard protocol following bolus administration of intravenous contrast. CONTRAST:  4mL OMNIPAQUE IOHEXOL 300 MG/ML SOLN, 3mL OMNIPAQUE IOHEXOL 300 MG/ML SOLN COMPARISON:  03/30/2015 FINDINGS: Lower chest: Small pleural effusions are noted left greater night. There is overlying compressive type atelectasis and consolidation involving the left lower lobe. Nodule in the lingula is identified measuring 7 mm, image number 1 of series 4. New from previous exam. New right lower lobe pulmonary nodule is identified measuring 4 mm, image 12 of series 4. Also new from previous exam is a lingular nodule measuring 4 mm, image 2 of series 4. Hepatobiliary: No focal liver abnormality identified. There has been interval progression of intrahepatic bile duct dilatation. The common bile duct measures 1.6 cm, image number 39 of series 6. On the previous exam this measured 0.9 cm. Distal common bile duct stricture is suspected, image 39 of series 6. Pancreas: Mild diffuse edema of the pancreas. The appearance is similar to previous exam. Spleen: Calcified granulomas identified within the  spleen. Adrenals/Urinary Tract: Scratch set bilateral adrenal nodules are again identified. There is left-sided hydronephrosis, progressed from previous exam. Normal appearance of the right kidney. The urinary bladder appears within normal limits. Stomach/Bowel: The stomach and the small bowel loops have a normal course and caliber. No bowel obstruction. No dilated loops of small or large bowel loops identified at this time. Vascular/Lymphatic: Calcified atherosclerotic disease involves the abdominal aorta. No aneurysm. Index portacaval node measures 1 cm, image 30 of series 5. Previously 1.3 cm. Gastrohepatic ligament lymph node measures 9 mm, image 29 of series 5. Previously 1.2 cm. The index left periaortic node measures 1.5 cm, image 38 of series 5. Previously 1.1 cm. Reproductive: The uterus and the adnexal structures are unremarkable. Other: Small fat containing hernias are identified at the previous ostomy site within the right lower quadrant ventral abdominal wall, image 51 of series 5. There is a small amount of adjacent fluid within the ventral abdominal wall measuring 2.7 by 0.9 cm, image 51 of series 5. No significant free fluid within the abdomen or pelvis and no fluid collections identified. Musculoskeletal: Spondylosis noted within the lumbar spine. No aggressive lytic or sclerotic bone lesions. IMPRESSION: 1. Interval progression of intrahepatic and common bile duct dilatation. A stricture involving the distal common bile duct is suspected. Underlying mass lesion cannot be excluded. 2. Progressive dilatation of the left renal collecting system to the level of the UPJ. Findings are compatible with hydronephrosis. No obstructing stone or mass noted. 3. Increase in size of left periaortic lymph node. Portacaval and gastrohepatic ligament lymph nodes have decreased in size in the interval. 4. New pulmonary nodules are identified within the lungs, suspicious for metastatic disease. Electronically Signed    By: Kerby Moors M.D.   On: 05/24/2015 13:11   Dg Chest Port 1 View  05/29/2015  CLINICAL DATA:  Dyspnea. EXAM: PORTABLE CHEST 1 VIEW COMPARISON:  05/23/2015 FINDINGS: New hazy opacification over the left base likely small effusion with associated atelectasis. Possible small amount right pleural fluid/atelectasis. Cardiomediastinal silhouette is within normal. There is calcified plaque over the aortic arch. There are degenerative changes of the spine and shoulders. IMPRESSION: Interval development of left base opacification likely small effusions/atelectasis and possible  small amount right pleural fluid/atelectasis. Electronically Signed   By: Marin Olp M.D.   On: 05/29/2015 08:52   Dg Abd 2 Views  05/29/2015  CLINICAL DATA:  Abdominal pain and distention. EXAM: ABDOMEN - 2 VIEW COMPARISON:  CT 05/24/2015 FINDINGS: Exam demonstrates several air-filled dilated small bowel loops over the right abdomen measuring up to 3.4 mm 3.4 cm in diameter. Several scattered air-fluid levels are present on the decubitus film. Air and stool present throughout the colon with moderate fecal retention over the rectum. Multiple surgical sutures are present over the left mid abdomen likely related to previous bowel surgery. No evidence of free peritoneal. Moderate degenerate changes of the spine and degenerative change of the left hip. Right hip prosthesis intact. IMPRESSION: Several air-filled mildly dilated small bowel loops over the right abdomen/pelvis with scattered air-fluid levels. Air and stool throughout the colon. Findings may be due to early/partial small bowel obstruction. Electronically Signed   By: Marin Olp M.D.   On: 05/29/2015 14:16    Objective: Filed Vitals:   05/29/15 0645 05/29/15 1201 05/29/15 2221 05/30/15 0430  BP: 126/85 108/68 107/69 118/71  Pulse: 108 93 93 97  Temp:  97.6 F (36.4 C) 97.4 F (36.3 C) 97.4 F (36.3 C)  TempSrc:  Oral Oral Oral  Resp:  20 20 20   Height:        Weight:    56.2 kg (123 lb 14.4 oz)  SpO2:  99% 98% 97%    Intake/Output Summary (Last 24 hours) at 05/30/15 1635 Last data filed at 05/30/15 1200  Gross per 24 hour  Intake  566.5 ml  Output    401 ml  Net  165.5 ml   Weight change: -0.3 kg (-10.6 oz) Exam:   General:  Pt is alert, follows commands appropriately, not in acute distress  Cardiovascular: RRR, S1/S2\  Respiratory: CTA b/l   Abdomen: Soft/+BS, non tender, non distended, no guarding   Data Reviewed: Basic Metabolic Panel:  Recent Labs Lab 05/25/15 0427 05/26/15 0539 05/27/15 0544 05/28/15 0511 05/29/15 0530  NA 140 143 143 140 137  K 3.5 4.6 5.1 4.3 3.8  CL 113* 115* 117* 115* 113*  CO2 19* 15* 15* 15* 13*  GLUCOSE 123* 118* 131* 141* 129*  BUN 20 19 24* 24* 17  CREATININE 0.51 0.45 0.52 0.47 0.48  CALCIUM 8.0* 8.6* 8.9 8.4* 8.3*  MG  --  1.9 2.1  --   --    Liver Function Tests:  Recent Labs Lab 05/23/15 2120 05/25/15 0427  AST 20 19  ALT 20 20  ALKPHOS 80 68  BILITOT 0.9 0.3  PROT 6.0* 4.9*  ALBUMIN 2.5* 1.8*   No results for input(s): LIPASE, AMYLASE in the last 168 hours. No results for input(s): AMMONIA in the last 168 hours. CBC:  Recent Labs Lab 05/23/15 2120  05/25/15 0427 05/26/15 0539 05/27/15 0544 05/28/15 0511 05/29/15 0530  WBC 22.6*  < > 14.7* 14.3* 16.4* 13.0* 20.5*  NEUTROABS 21.2*  --   --   --   --   --   --   HGB 13.3  < > 10.0* 10.8* 11.1* 10.7* 13.2  HCT 39.6  < > 30.2* 33.3* 34.1* 33.5* 38.6  MCV 86.3  < > 87.8 89.0 89.5 89.1 86.0  PLT 245  < > 229 273 336 240 302  < > = values in this interval not displayed. Cardiac Enzymes:  Recent Labs Lab 05/24/15 0037 05/24/15 [id number] 05/24/15 1405  TROPONINI 0.45* 0.63* 0.59*   BNP: Invalid input(s): POCBNP CBG: No results for input(s): GLUCAP in the last 168 hours.  Recent Results (from the past 240 hour(s))  Culture, blood (Routine X 2) w Reflex to ID Panel     Status: None   Collection Time: 05/23/15   9:20 PM  Result Value Ref Range Status   Specimen Description BLOOD RIGHT HAND  Final   Special Requests IN PEDIATRIC BOTTLE 2CC  Final   Culture  Setup Time   Final    GRAM POSITIVE COCCI IN CLUSTERS PEDIATRIC BOTTLE S. WEST,RN AT 1451 ON [id number] BY Rhea Bleacher    Culture   Final    STAPHYLOCOCCUS AUREUS SUSCEPTIBILITIES PERFORMED ON PREVIOUS CULTURE WITHIN THE LAST 5 DAYS. Performed at [facility]    Report Status 05/26/2015 FINAL  Final  Culture, blood (Routine X 2) w Reflex to ID Panel     Status: None   Collection Time: 05/23/15  9:35 PM  Result Value Ref Range Status   Specimen Description BLOOD LEFT FOREARM  Final   Special Requests BOTTLES DRAWN AEROBIC AND ANAEROBIC 5CC  Final   Culture  Setup Time   Final    GRAM POSITIVE COCCI IN CLUSTERS IN BOTH AEROBIC AND ANAEROBIC BOTTLES CRITICAL RESULT CALLED TO, READ BACK BY AND VERIFIED WITH: I BOULOUDENE,RN AT [id number] 05/24/15 BY L BENFIELD    Culture   Final    STAPHYLOCOCCUS AUREUS Performed at [facility]    Report Status 05/26/2015 FINAL  Final   Organism ID, Bacteria STAPHYLOCOCCUS AUREUS  Final      Susceptibility   Staphylococcus aureus - MIC*    CIPROFLOXACIN <=0.5 SENSITIVE Sensitive     ERYTHROMYCIN 0.5 SENSITIVE Sensitive     GENTAMICIN <=0.5 SENSITIVE Sensitive     OXACILLIN 0.5 SENSITIVE Sensitive     TETRACYCLINE <=1 SENSITIVE Sensitive     VANCOMYCIN 1 SENSITIVE Sensitive     TRIMETH/SULFA <=10 SENSITIVE Sensitive     CLINDAMYCIN <=0.25 SENSITIVE Sensitive     RIFAMPIN <=0.5 SENSITIVE Sensitive     Inducible Clindamycin NEGATIVE Sensitive     * STAPHYLOCOCCUS AUREUS  Urine culture     Status: None   Collection Time: 05/23/15  9:55 PM  Result Value Ref Range Status   Specimen Description URINE, CATHETERIZED  Final   Special Requests Normal  Final   Culture   Final    >=100,000 COLONIES/mL STAPHYLOCOCCUS AUREUS Performed at [facility]    Report Status 05/26/2015 FINAL  Final    Organism ID, Bacteria STAPHYLOCOCCUS AUREUS  Final      Susceptibility   Staphylococcus aureus - MIC*    CIPROFLOXACIN <=0.5 SENSITIVE Sensitive     GENTAMICIN <=0.5 SENSITIVE Sensitive     NITROFURANTOIN <=16 SENSITIVE Sensitive     OXACILLIN 0.5 SENSITIVE Sensitive     TETRACYCLINE <=1 SENSITIVE Sensitive     VANCOMYCIN 1 SENSITIVE Sensitive     TRIMETH/SULFA <=10 SENSITIVE Sensitive     CLINDAMYCIN <=0.25 SENSITIVE Sensitive     RIFAMPIN <=0.5 SENSITIVE Sensitive     Inducible Clindamycin NEGATIVE Sensitive     * >=100,000 COLONIES/mL STAPHYLOCOCCUS AUREUS  MRSA PCR Screening     Status: None   Collection Time: 05/24/15  7:02 PM  Result Value Ref Range Status   MRSA by PCR NEGATIVE NEGATIVE Final    Comment:        The GeneXpert MRSA Assay (FDA approved for NASAL specimens only), is one  component of a comprehensive MRSA colonization surveillance program. It is not intended to diagnose MRSA infection nor to guide or monitor treatment for MRSA infections.   Culture, blood (routine x 2)     Status: None   Collection Time: 05/25/15  6:00 PM  Result Value Ref Range Status   Specimen Description BLOOD RIGHT HAND  Final   Special Requests IN PEDIATRIC BOTTLE 2.5CC  Final   Culture  Setup Time   Final    GRAM POSITIVE COCCI IN CLUSTERS PEDIATRIC BOTTLE CALLED TO SEAY,K RN 05/26/15 1754 [place] BY K WOOLLEN    Culture   Final    STAPHYLOCOCCUS AUREUS SUSCEPTIBILITIES PERFORMED ON PREVIOUS CULTURE WITHIN THE LAST 5 DAYS. Performed at [facility]    Report Status 05/28/2015 FINAL  Final  Culture, blood (routine x 2)     Status: None   Collection Time: 05/25/15  6:08 PM  Result Value Ref Range Status   Specimen Description BLOOD RIGHT HAND  Final   Special Requests IN PEDIATRIC BOTTLE 1CC  Final   Culture  Setup Time   Final    GRAM POSITIVE COCCI IN CLUSTERS PEDIATRIC BOTTLE CALLED TO SEAY,K RN 05/26/15 1755 [place]    Culture   Final     STAPHYLOCOCCUS AUREUS SUSCEPTIBILITIES PERFORMED ON PREVIOUS CULTURE WITHIN THE LAST 5 DAYS. Performed at [facility]    Report Status 05/28/2015 FINAL  Final  Culture, blood (Routine X 2) w Reflex to ID Panel     Status: None (Preliminary result)   Collection Time: 05/28/15  5:11 AM  Result Value Ref Range Status   Specimen Description BLOOD RIGHT ARM  Final   Special Requests BOTTLES DRAWN AEROBIC AND ANAEROBIC 5CC  Final   Culture   Final    NO GROWTH 2 DAYS Performed at [facility]    Report Status PENDING  Incomplete  Culture, blood (Routine X 2) w Reflex to ID Panel     Status: None (Preliminary result)   Collection Time: 05/28/15  5:14 AM  Result Value Ref Range Status   Specimen Description BLOOD RIGHT HAND  Final   Special Requests BOTTLES DRAWN AEROBIC AND ANAEROBIC 6CC  Final   Culture   Final    NO GROWTH 2 DAYS Performed at [facility]    Report Status PENDING  Incomplete     Scheduled Meds: . dexamethasone  4 mg Oral Daily  . feeding supplement (ENSURE ENLIVE)  237 mL Oral TID BM   Continuous Infusions: . sodium chloride 10 mL/hr at 05/29/15 1621     Vannia Pola, MD  [facility] Pager: [email or web address] Password [email or web address] 05/30/2015, 4:35 PM   LOS: 6 days

## 2015-05-31 ENCOUNTER — Telehealth: Payer: Self-pay | Admitting: *Deleted

## 2015-05-31 DIAGNOSIS — R7881 Bacteremia: Secondary | ICD-10-CM

## 2015-05-31 DIAGNOSIS — B9561 Methicillin susceptible Staphylococcus aureus infection as the cause of diseases classified elsewhere: Secondary | ICD-10-CM

## 2015-05-31 DIAGNOSIS — I10 Essential (primary) hypertension: Secondary | ICD-10-CM

## 2015-05-31 MED ORDER — BISACODYL 10 MG RE SUPP: 10.0000 mg | Freq: Every day | RECTAL | Status: AC | PRN

## 2015-05-31 MED ORDER — ENSURE ENLIVE PO LIQD: 237.0000 mL | Freq: Three times a day (TID) | ORAL | Status: AC

## 2015-05-31 MED ORDER — DEXAMETHASONE 4 MG PO TABS: 4.0000 mg | ORAL_TABLET | Freq: Every day | ORAL | Status: AC

## 2015-05-31 NOTE — Telephone Encounter (Signed)
Call received in [place] from Erling Conte at [facility] stating pt will be transferred to their facility today. Inquiring if Dr Benay Spice will be attending.  Per Dr Benay Spice - he will be attending. This RN informed Erling Conte.

## 2015-05-31 NOTE — Progress Notes (Signed)
Pt discharged from the unit via [facility] to [facility]. Discharge papers were sent with transportation. Pt belongings sent home with daughter prior to discharge. Report called to [place], Therapist, sports. No questions or concerns from the pt or family members at this time.  Lynnetta Tom W Kyria Bumgardner, RN

## 2015-05-31 NOTE — Discharge Summary (Signed)
Physician Discharge Summary  [name] [id number] DOB: [birth year] DOA: 05/23/2015  PCP:  Melinda Crutch, MD  Admit date: 05/23/2015 Discharge date: 05/31/2015  Time spent: 50 minutes  Recommendations for Outpatient Follow-up:  1. 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 At baseline, the patient has mild cognitive deficit, but is able to express her needs and recognize her family. However, on the morning of admission, she reportedly called her son at approximately 10 AM to report crushing chest pain. He reportedly informed SNF staff of this complaint. When he went to visit her later in the afternoon, she was unable to recognize him and unable to give any information about her chest pain or other symptoms. She was noted to be febrile to 100.10F and a foul odor to the urine was also reported. The patient was recently discharged to [name] from the hospital on 05/11/2015 after suffering hemiparesis secondary to brain metastasis from her colon cancer. After admission, the patient was noted to have MSSA bacteremia. Antibiotics were ultimately narrowed to cefazolin. Repeat blood cultures were ordered 05/28/2015. The patient has  progressive nongapped metabolic acidosis likely resulting in her sensation of dyspnea. Suspect this may be due to her L-hydronephrosis resulting in some degree of tubular dysfunction and bicarbonate reabsorption. As a result, the patient was started on bicarbonate drip. Palliative medicine was consulted on 05/29/2015.I have started taking care of this patient on 3/22. She was transitioned to comfort care only by the palliative care team on 3/21. Currently on Decadron and when necessary morphine, Ativan which she has not needed in the past 24 hrs. She will be transitioned to [facility] today.   Hospital Course:  Sepsis secondary to UTI and Bacteremia--MSSA - Meets sepsis criteria on admission with fever, tachycardia, leukocytosis, elevated lactate, grossly positive UA  - Blood and urine cultures--MSSA - pt does not have any foreign bodies, no recent PICC or port -abx per ID-->signed off -05/28/15--d/c vanco, start cefazolin -3/15 and 3/17 blood cultures positive -Chest x-ray negative for infiltrates   Afib with RVR -05/25/15--echo EF 55-60%, moderate AI, HK of basalinferior myocardium -?due to PE-->CTA chest with elevated D-Dimer--neg for PE -pt became bradycardic with diltiazem drip-->d/c -05/25/15--initially placed on metoprolol--hold due to bradycardia now with 6 second pause -cardiology following--signed off 05/28/2015 -05/26/2015--patient developed RVR 120s--restart her metoprolol--increased to 25 mg q 6 hrs-->better controlled -Not an anticoagulation candidate secondary to brain metastasis and overall poor prognosis  Dyspnea -05/25/2015 CT angiogram chest negative for pulmonary embolus -05/29/2015--patient feels "can't catch breath"--likely due to metabolic acidosis--> check chest x-ray -No respiratory distress. Oxygen saturation 99% on room air.   Elevated troponin/Chest pain -Likely demand ischemia -No chest pain presently -EKG--sinus rhythm with nonspecific T-wave  inversion -AppreciateCardiology consult -  05/26/2015--CTA chest negative for PE, bilateral small pleural effusions  Left Hydronephrosis -suspect this may be due to her adenopathy -renal function remains preserved -consulted urology--discussed with Dr. Alyce Pagan pt has preserved renal function and pt is NOT to receive any further chemo or radiation from oncologic standpoint, performing surgery and placing another foreign body in the setting of sepsis is not indicated at this time  Acute metabolic encephalopathy -Secondary to infectious process -At baseline, the patient is able to converse and clarify her needs -improved, back to baseline  Abdominal pain -CT abdomen in setting of sepsis--Interval progression of intrahepatic and common bile duct dilatation. A stricture involving the distal common bile duct is Suspected. 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 See problem #1.  -On need to follow-up on duodenal biopsy pathology results from [name]. Per oncology. -Minimal CBD obstruction and duodenal partial obstruction-- biopsies from [facility]-->showed adenocarcinoma from EUS -Appreciate Dr. Ashok Cordia consult--case discussed with him  Left hemiparesis secondary to metastatic colon cancer -metastatic disease noted on MRI head which shows a 19 mm high right frontoparietal mass consistent with solitary metastases. Moderate vasogenic edema without shift.  -Continue po Decadron -pt had one SRS fraction on 05/11/15 -pathology report from duodenal biopsy at [facility] adenocarcinoma   Hypertension -Controlled with metoprolol tartrate -Discontinued home amlodipine -Hydralazine  as needed. -discontinue losartan HCTZ-->BP remains stable -HCTZ contributing to hyponatremia  hyponatremia - likely due to volume depletion from poor oral intake -IV fluids-->improved  nonGapped metabolic acidosis -trend -Suspect this may be due to left hydronephrosis causing some degree of renal tubular dysfunction/?RTA   Constipation -Dulcolax suppository ordered by palliative RN   Consultations:  ID, Cardiology, palliative care  Discharge Exam: Filed Weights   05/28/15 0549 05/29/15 0630 05/30/15 0430  Weight: 56.8 kg (125 lb 3.5 oz) 56.5 kg (124 lb 9 oz) 56.2 kg (123 lb 14.4 oz)   Filed Vitals:   05/30/15 2054 05/31/15 0600  BP: 143/78 153/75  Pulse: 95 90  Temp: 97.8 F (36.6 C) 98 F (36.7 C)  Resp: 18 18    General: AAO x 3, no distress Cardiovascular: RRR, no murmurs  Respiratory: clear to auscultation bilaterally GI: soft, non-tender, non-distended, bowel sound positive  Discharge Instructions You were cared for by a hospitalist during your hospital stay. If you have any questions about your discharge medications or the care you received while you were in the hospital after you are discharged, you can call the unit and asked to speak with the hospitalist on call if the hospitalist that took care of you is not available. Once you are discharged, your primary care physician will handle any further medical issues. Please note that NO REFILLS for any discharge medications will be authorized once you are discharged, as it is imperative that you return to your primary care physician (or establish a relationship with a primary care physician if you do not have one) for your aftercare needs so that they can reassess your need for medications and monitor your lab values.      Discharge Instructions    Discharge instructions    Complete by:  As directed   Regular diet as tolerated            Medication List    STOP taking  these medications        amLODipine 5  MG tablet  Commonly known as:  NORVASC     aspirin EC 81 MG tablet     HYDROcodone-acetaminophen 5-325 MG tablet  Commonly known as:  NORCO/VICODIN     prochlorperazine 5 MG tablet  Commonly known as:  COMPAZINE     traMADol 50 MG tablet  Commonly known as:  ULTRAM      TAKE these medications        acetaminophen 650 MG CR tablet  Commonly known as:  TYLENOL  Take 650 mg by mouth every 8 (eight) hours as needed for pain.     bisacodyl 10 MG suppository  Commonly known as:  DULCOLAX  Place 1 suppository (10 mg total) rectally daily as needed for mild constipation.     dexamethasone 4 MG tablet  Commonly known as:  DECADRON  Take 1 tablet (4 mg total) by mouth daily.     feeding supplement (ENSURE ENLIVE) Liqd  Take 237 mLs by mouth 3 (three) times daily between meals.     fluocinonide cream 0.05 %  Commonly known as:  LIDEX  Apply 1 application topically 2 (two) times daily as needed (rash).     LORazepam 0.5 MG tablet  Commonly known as:  ATIVAN  Take 0.5 tablets (0.25 mg total) by mouth every 6 (six) hours as needed for anxiety.     ondansetron 8 MG tablet  Commonly known as:  ZOFRAN  Take 1 tablet (8 mg total) by mouth every 8 (eight) hours as needed for nausea or vomiting.     ROXANOL PO  Take 5 mLs by mouth every 4 (four) hours as needed (pain).     senna 8.6 MG tablet  Commonly known as:  SENOKOT  Take 1 tablet by mouth 2 (two) times daily.       No Known Allergies    The results of significant diagnostics from this hospitalization (including imaging, microbiology, ancillary and laboratory) are listed below for reference.    Significant Diagnostic Studies: Dg Chest 2 View  05/23/2015  CLINICAL DATA:  Altered mental status, weakness and fever. History of colon cancer. EXAM: CHEST  2 VIEW COMPARISON:  Chest x-ray dated 07/06/2013. FINDINGS: Mild cardiomegaly is stable. Atherosclerotic changes again noted at the aortic arch. Vague opacity at the left  costophrenic angle is grossly stable, most compatible with chronic atelectasis or chronic pleural thickening. Lungs are otherwise clear. No evidence of pneumonia. Mild degenerative change again noted within the thoracic spine. Degenerative changes also noted at each shoulder. No acute- appearing osseous abnormality. IMPRESSION: No active cardiopulmonary disease. Electronically Signed   By: Franki Cabot M.D.   On: 05/23/2015 21:41   Ct Head Wo Contrast  05/23/2015  CLINICAL DATA:  Acute onset of altered level of consciousness. Fever and malodorous urine. Nausea and vomiting. Initial encounter. EXAM: CT HEAD WITHOUT CONTRAST TECHNIQUE: Contiguous axial images were obtained from the base of the skull through the vertex without intravenous contrast. COMPARISON:  MRI of the brain performed 05/10/2015 FINDINGS: A 1.9 cm right parasagittal mass is again noted at the convexity, with surrounding decreased attenuation within the white matter. No midline shift is seen. Mild periventricular white matter change likely reflects small vessel ischemic microangiopathy. There is no evidence of acute intra or extra-axial hemorrhage. There is no evidence for acute infarct. The brainstem and fourth ventricle are within normal limits. The basal ganglia are unremarkable in appearance. The cerebral hemispheres demonstrate grossly  normal gray-white differentiation. No mass effect or midline shift is seen. There is no evidence of fracture; visualized osseous structures are unremarkable in appearance. The orbits are within normal limits. The paranasal sinuses and mastoid air cells are well-aerated. No significant soft tissue abnormalities are seen. IMPRESSION: 1. 1.9 cm right parasagittal mass at the convexity, with surrounding decreased attenuation within the white matter. No midline shift seen. 2. Mild small vessel ischemic microangiopathy. Electronically Signed   By: Garald Balding M.D.   On: 05/23/2015 21:58   Ct Angio Chest Pe W/cm  &/or Wo Cm  05/25/2015  CLINICAL DATA:  Elevated D-dimer. Metastatic colon cancer. New atrial fibrillation. EXAM: CT ANGIOGRAPHY CHEST WITH CONTRAST TECHNIQUE: Multidetector CT imaging of the chest was performed using the standard protocol during bolus administration of intravenous contrast. Multiplanar CT image reconstructions and MIPs were obtained to evaluate the vascular anatomy. CONTRAST:  133mL OMNIPAQUE IOHEXOL 350 MG/ML SOLN COMPARISON:  Mid chest radiographs dated 05/23/2015 and chest CT dated 03/30/2015. FINDINGS: Mediastinum/Lymph Nodes: No pulmonary emboli or thoracic aortic dissection identified. No masses or pathologically enlarged lymph nodes identified. Lungs/Pleura: Multiple bilateral lung nodules. The largest is a 10 mm upper lobe nodule on image number 25 of series 7. There are multiple additional smaller nodules throughout both lungs. There are also small bilateral pleural effusions and mild bilateral lower lobe atelectasis. Upper abdomen: Progressive biliary ductal dilatation. The left hepatic duct currently measures 14 mm in diameter. This previously measured 6 mm. Musculoskeletal: Marked right shoulder degenerative changes and incompletely included left shoulder degenerative changes. Extensive, large calcified loose bodies are again demonstrated inferior and medial to the right shoulder. No evidence of bony metastatic disease. Review of the MIP images confirms the above findings. IMPRESSION: 1. No pulmonary emboli. 2. Small bilateral pleural effusions. 3. Mild bilateral lower lobe atelectasis. 4. Multiple bilateral lung nodules compatible with lung metastases. 5. Biliary obstruction with a significant interval increase in size of the intrahepatic ducts. This suggest at the pancreatic head abnormality seen on the previous CTA may have represented a pancreatic neoplasm. This is not included on the current examination. Electronically Signed   By: Claudie Revering M.D.   On: 05/25/2015 18:53   Mr  [name] [id number] Contrast  05/10/2015  CLINICAL DATA:  Colon cancer. LEFT hemiparesis, leg greater than arm. Confusion. SRS planning EXAM: MRI HEAD WITHOUT AND WITH CONTRAST TECHNIQUE: Multiplanar, multiecho pulse sequences of the brain and surrounding structures were obtained without and with intravenous contrast. CONTRAST:  59mL MULTIHANCE GADOBENATE DIMEGLUMINE 529 MG/ML IV SOLN COMPARISON:  1.5 tesla MR 05/08/2015. FINDINGS: Redemonstrated is 14 x 18 x 18 mm (R-L x A-P x C-C) superficial, but intra-axial mass, RIGHT posterior frontal cortex, parasagittal location with surrounding vasogenic edema. There is avid postcontrast enhancement. Marked surrounding vasogenic edema with no midline shift but slight depression of the RIGHT lateral ventricle. No additional brain lesions. No osseous lesions. Extracranial soft tissues unremarkable. No significant change from previous exam. IMPRESSION: Superficial, but intra-axial, RIGHT posterior frontal parasagittal metastasis. This appears to be a solitary lesion, approximately 2 cm. Extensive vasogenic edema but no midline shift. Electronically Signed   By: Staci Righter M.D.   On: 05/10/2015 15:32   Mr [name] [id number] Contrast  05/08/2015  CLINICAL DATA:  Left hemi paresis. History of colon cancer. Metastasis versus infarct. EXAM: MRI HEAD WITHOUT AND WITH CONTRAST TECHNIQUE: Multiplanar, multiecho pulse sequences of the brain and surrounding structures were obtained without and with intravenous contrast. CONTRAST:  10 cc  MultiHance intravenous COMPARISON:  None. FINDINGS: Calvarium and upper cervical spine: No definitive skeletal metastasis. Orbits: Bilateral cataract resection.  Negative for mass Sinuses and Mastoids: Clear. Brain: There is an enhancing intra-axial mass in the parasagittal right frontal parietal lobe along the upper motor strip measuring 19 mm. Moderate vasogenic edema without shift or herniation. No other masses identified. MR signal shows dense cellularity. No  definitive hemorrhagic component. No acute infarct, hemorrhage, hydrocephalus, or major vessel occlusion. Metastasis is unexpected finding, but to accelerate care these results will be called to the ordering clinician or representative by the Radiologist Assistant, and communication documented in the PACS or zVision Dashboard. IMPRESSION: 19 mm high right frontoparietal mass consistent with solitary metastasis. Moderate vasogenic edema without shift. Electronically Signed   By: Monte Fantasia M.D.   On: 05/08/2015 11:40   Ct Abdomen Pelvis W Contrast  05/24/2015  CLINICAL DATA:  Abdominal pain.  Sepsis.  History of colon cancer. EXAM: CT ABDOMEN AND PELVIS WITH CONTRAST TECHNIQUE: Multidetector CT imaging of the abdomen and pelvis was performed using the standard protocol following bolus administration of intravenous contrast. CONTRAST:  67mL OMNIPAQUE IOHEXOL 300 MG/ML SOLN, 59mL OMNIPAQUE IOHEXOL 300 MG/ML SOLN COMPARISON:  03/30/2015 FINDINGS: Lower chest: Small pleural effusions are noted left greater night. There is overlying compressive type atelectasis and consolidation involving the left lower lobe. Nodule in the lingula is identified measuring 7 mm, image number 1 of series 4. New from previous exam. New right lower lobe pulmonary nodule is identified measuring 4 mm, image 12 of series 4. Also new from previous exam is a lingular nodule measuring 4 mm, image 2 of series 4. Hepatobiliary: No focal liver abnormality identified. There has been interval progression of intrahepatic bile duct dilatation. The common bile duct measures 1.6 cm, image number 39 of series 6. On the previous exam this measured 0.9 cm. Distal common bile duct stricture is suspected, image 39 of series 6. Pancreas: Mild diffuse edema of the pancreas. The appearance is similar to previous exam. Spleen: Calcified granulomas identified within the spleen. Adrenals/Urinary Tract: Scratch set bilateral adrenal nodules are again identified.  There is left-sided hydronephrosis, progressed from previous exam. Normal appearance of the right kidney. The urinary bladder appears within normal limits. Stomach/Bowel: The stomach and the small bowel loops have a normal course and caliber. No bowel obstruction. No dilated loops of small or large bowel loops identified at this time. Vascular/Lymphatic: Calcified atherosclerotic disease involves the abdominal aorta. No aneurysm. Index portacaval node measures 1 cm, image 30 of series 5. Previously 1.3 cm. Gastrohepatic ligament lymph node measures 9 mm, image 29 of series 5. Previously 1.2 cm. The index left periaortic node measures 1.5 cm, image 38 of series 5. Previously 1.1 cm. Reproductive: The uterus and the adnexal structures are unremarkable. Other: Small fat containing hernias are identified at the previous ostomy site within the right lower quadrant ventral abdominal wall, image 51 of series 5. There is a small amount of adjacent fluid within the ventral abdominal wall measuring 2.7 by 0.9 cm, image 51 of series 5. No significant free fluid within the abdomen or pelvis and no fluid collections identified. Musculoskeletal: Spondylosis noted within the lumbar spine. No aggressive lytic or sclerotic bone lesions. IMPRESSION: 1. Interval progression of intrahepatic and common bile duct dilatation. A stricture involving the distal common bile duct is suspected. Underlying mass lesion cannot be excluded. 2. Progressive dilatation of the left renal collecting system to the level of the UPJ. Findings are  compatible with hydronephrosis. No obstructing stone or mass noted. 3. Increase in size of left periaortic lymph node. Portacaval and gastrohepatic ligament lymph nodes have decreased in size in the interval. 4. New pulmonary nodules are identified within the lungs, suspicious for metastatic disease. Electronically Signed   By: Kerby Moors M.D.   On: 05/24/2015 13:11   Dg Chest Port 1 View  05/29/2015   CLINICAL DATA:  Dyspnea. EXAM: PORTABLE CHEST 1 VIEW COMPARISON:  05/23/2015 FINDINGS: New hazy opacification over the left base likely small effusion with associated atelectasis. Possible small amount right pleural fluid/atelectasis. Cardiomediastinal silhouette is within normal. There is calcified plaque over the aortic arch. There are degenerative changes of the spine and shoulders. IMPRESSION: Interval development of left base opacification likely small effusions/atelectasis and possible small amount right pleural fluid/atelectasis. Electronically Signed   By: Marin Olp M.D.   On: 05/29/2015 08:52   Dg Abd 2 Views  05/29/2015  CLINICAL DATA:  Abdominal pain and distention. EXAM: ABDOMEN - 2 VIEW COMPARISON:  CT 05/24/2015 FINDINGS: Exam demonstrates several air-filled dilated small bowel loops over the right abdomen measuring up to 3.4 mm 3.4 cm in diameter. Several scattered air-fluid levels are present on the decubitus film. Air and stool present throughout the colon with moderate fecal retention over the rectum. Multiple surgical sutures are present over the left mid abdomen likely related to previous bowel surgery. No evidence of free peritoneal. Moderate degenerate changes of the spine and degenerative change of the left hip. Right hip prosthesis intact. IMPRESSION: Several air-filled mildly dilated small bowel loops over the right abdomen/pelvis with scattered air-fluid levels. Air and stool throughout the colon. Findings may be due to early/partial small bowel obstruction. Electronically Signed   By: Marin Olp M.D.   On: 05/29/2015 14:16    Microbiology: Recent Results (from the past 240 hour(s))  Culture, blood (Routine X 2) w Reflex to ID Panel     Status: None   Collection Time: 05/23/15  9:20 PM  Result Value Ref Range Status   Specimen Description BLOOD RIGHT HAND  Final   Special Requests IN PEDIATRIC BOTTLE 2CC  Final   Culture  Setup Time   Final    GRAM POSITIVE COCCI IN  CLUSTERS PEDIATRIC BOTTLE S. WEST,RN AT 1451 ON [id number] BY Rhea Bleacher    Culture   Final    STAPHYLOCOCCUS AUREUS SUSCEPTIBILITIES PERFORMED ON PREVIOUS CULTURE WITHIN THE LAST 5 DAYS. Performed at [facility]    Report Status 05/26/2015 FINAL  Final  Culture, blood (Routine X 2) w Reflex to ID Panel     Status: None   Collection Time: 05/23/15  9:35 PM  Result Value Ref Range Status   Specimen Description BLOOD LEFT FOREARM  Final   Special Requests BOTTLES DRAWN AEROBIC AND ANAEROBIC 5CC  Final   Culture  Setup Time   Final    GRAM POSITIVE COCCI IN CLUSTERS IN BOTH AEROBIC AND ANAEROBIC BOTTLES CRITICAL RESULT CALLED TO, READ BACK BY AND VERIFIED WITH: I BOULOUDENE,RN AT [id number] 05/24/15 BY L BENFIELD    Culture   Final    STAPHYLOCOCCUS AUREUS Performed at [facility]    Report Status 05/26/2015 FINAL  Final   Organism ID, Bacteria STAPHYLOCOCCUS AUREUS  Final      Susceptibility   Staphylococcus aureus - MIC*    CIPROFLOXACIN <=0.5 SENSITIVE Sensitive     ERYTHROMYCIN 0.5 SENSITIVE Sensitive     GENTAMICIN <=0.5 SENSITIVE Sensitive  OXACILLIN 0.5 SENSITIVE Sensitive     TETRACYCLINE <=1 SENSITIVE Sensitive     VANCOMYCIN 1 SENSITIVE Sensitive     TRIMETH/SULFA <=10 SENSITIVE Sensitive     CLINDAMYCIN <=0.25 SENSITIVE Sensitive     RIFAMPIN <=0.5 SENSITIVE Sensitive     Inducible Clindamycin NEGATIVE Sensitive     * STAPHYLOCOCCUS AUREUS  Urine culture     Status: None   Collection Time: 05/23/15  9:55 PM  Result Value Ref Range Status   Specimen Description URINE, CATHETERIZED  Final   Special Requests Normal  Final   Culture   Final    >=100,000 COLONIES/mL STAPHYLOCOCCUS AUREUS Performed at [facility]    Report Status 05/26/2015 FINAL  Final   Organism ID, Bacteria STAPHYLOCOCCUS AUREUS  Final      Susceptibility   Staphylococcus aureus - MIC*    CIPROFLOXACIN <=0.5 SENSITIVE Sensitive     GENTAMICIN <=0.5 SENSITIVE Sensitive      NITROFURANTOIN <=16 SENSITIVE Sensitive     OXACILLIN 0.5 SENSITIVE Sensitive     TETRACYCLINE <=1 SENSITIVE Sensitive     VANCOMYCIN 1 SENSITIVE Sensitive     TRIMETH/SULFA <=10 SENSITIVE Sensitive     CLINDAMYCIN <=0.25 SENSITIVE Sensitive     RIFAMPIN <=0.5 SENSITIVE Sensitive     Inducible Clindamycin NEGATIVE Sensitive     * >=100,000 COLONIES/mL STAPHYLOCOCCUS AUREUS  MRSA PCR Screening     Status: None   Collection Time: 05/24/15  7:02 PM  Result Value Ref Range Status   MRSA by PCR NEGATIVE NEGATIVE Final    Comment:        The GeneXpert MRSA Assay (FDA approved for NASAL specimens only), is one component of a comprehensive MRSA colonization surveillance program. It is not intended to diagnose MRSA infection nor to guide or monitor treatment for MRSA infections.   Culture, blood (routine x 2)     Status: None   Collection Time: 05/25/15  6:00 PM  Result Value Ref Range Status   Specimen Description BLOOD RIGHT HAND  Final   Special Requests IN PEDIATRIC BOTTLE 2.5CC  Final   Culture  Setup Time   Final    GRAM POSITIVE COCCI IN CLUSTERS PEDIATRIC BOTTLE CALLED TO SEAY,K RN 05/26/15 1754 [place] BY K WOOLLEN    Culture   Final    STAPHYLOCOCCUS AUREUS SUSCEPTIBILITIES PERFORMED ON PREVIOUS CULTURE WITHIN THE LAST 5 DAYS. Performed at [facility]    Report Status 05/28/2015 FINAL  Final  Culture, blood (routine x 2)     Status: None   Collection Time: 05/25/15  6:08 PM  Result Value Ref Range Status   Specimen Description BLOOD RIGHT HAND  Final   Special Requests IN PEDIATRIC BOTTLE 1CC  Final   Culture  Setup Time   Final    GRAM POSITIVE COCCI IN CLUSTERS PEDIATRIC BOTTLE CALLED TO SEAY,K RN 05/26/15 1755 [place]    Culture   Final    STAPHYLOCOCCUS AUREUS SUSCEPTIBILITIES PERFORMED ON PREVIOUS CULTURE WITHIN THE LAST 5 DAYS. Performed at [facility]    Report Status 05/28/2015 FINAL  Final  Culture, blood (Routine X 2) w  Reflex to ID Panel     Status: None (Preliminary result)   Collection Time: 05/28/15  5:11 AM  Result Value Ref Range Status   Specimen Description BLOOD RIGHT ARM  Final   Special Requests BOTTLES DRAWN AEROBIC AND ANAEROBIC 5CC  Final   Culture   Final    NO GROWTH 2  DAYS Performed at [facility]    Report Status PENDING  Incomplete  Culture, blood (Routine X 2) w Reflex to ID Panel     Status: None (Preliminary result)   Collection Time: 05/28/15  5:14 AM  Result Value Ref Range Status   Specimen Description BLOOD RIGHT HAND  Final   Special Requests BOTTLES DRAWN AEROBIC AND ANAEROBIC 6CC  Final   Culture   Final    NO GROWTH 2 DAYS Performed at [facility]    Report Status PENDING  Incomplete     Labs: Basic Metabolic Panel:  Recent Labs Lab 05/25/15 0427 05/26/15 0539 05/27/15 0544 05/28/15 0511 05/29/15 0530  NA 140 143 143 140 137  K 3.5 4.6 5.1 4.3 3.8  CL 113* 115* 117* 115* 113*  CO2 19* 15* 15* 15* 13*  GLUCOSE 123* 118* 131* 141* 129*  BUN 20 19 24* 24* 17  CREATININE 0.51 0.45 0.52 0.47 0.48  CALCIUM 8.0* 8.6* 8.9 8.4* 8.3*  MG  --  1.9 2.1  --   --    Liver Function Tests:  Recent Labs Lab 05/25/15 0427  AST 19  ALT 20  ALKPHOS 68  BILITOT 0.3  PROT 4.9*  ALBUMIN 1.8*   No results for input(s): LIPASE, AMYLASE in the last 168 hours. No results for input(s): AMMONIA in the last 168 hours. CBC:  Recent Labs Lab 05/25/15 0427 05/26/15 0539 05/27/15 0544 05/28/15 0511 05/29/15 0530  WBC 14.7* 14.3* 16.4* 13.0* 20.5*  HGB 10.0* 10.8* 11.1* 10.7* 13.2  HCT 30.2* 33.3* 34.1* 33.5* 38.6  MCV 87.8 89.0 89.5 89.1 86.0  PLT 229 273 336 240 302   Cardiac Enzymes:  Recent Labs Lab 05/24/15 1405  TROPONINI 0.59*   BNP: BNP (last 3 results)  Recent Labs  05/24/15 0037  BNP 332.5*    ProBNP (last 3 results) No results for input(s): PROBNP in the last 8760 hours.  CBG: No results for input(s): GLUCAP in the last  168 hours.     SignedDebbe Odea, MD [facility] 05/31/2015, 9:13 AM

## 2015-05-31 NOTE — Consult Note (Signed)
[facility] [employer] Received request from [place] for family interest in [facility]. Met briefly with patient who deferred me to daughter to answer questions. Spoke with Colletta [place] by phone and then met with her to complete registration paper work for transfer to [facility]. Dr. Benay Spice confirmed to attend at [facility] per family preference. CSW aware of above.   Please fax discharge summary to [phone].  RN please call report to [phone].  Thank you. Erling Conte, [place]

## 2015-05-31 NOTE — Progress Notes (Signed)
Pt for discharge to [facility].   CSW facilitated pt discharge needs including contacting facility, faxing pt discharge information, discussing with pt at bedside and pt daughter, Colletta Maryland via telephone, providing RN phone number to call report, and arranging ambulance transport for pt to [facility].   Pt and pt daughter coping appropriately with transition to [facility].  No further social work needs identified at this time.  CSW signing off.   Alison Murray, MSW, [place] Work [phone]

## 2015-05-31 NOTE — Progress Notes (Signed)
Clinical Social Work Note-Late Entry  CSW continuing to follow.   CSW received notification that [facility] came to evaluate pt and did not feel that pt meets criteria for end of life care at [facility].   Pt family plans to discuss option of taking pt home with hospice vs. Return to [place] if [facility] continues to not have bed available.   CSW to follow up in AM regarding plan.   Alison Murray, MSW, [place] Work [phone]

## 2015-05-31 NOTE — Progress Notes (Signed)
CSW continuing to follow.   CSW received notification from [facility], Erling Conte that [facility] does feel that pt meets criteria and pt has a bed available today. [place] liaison has notified pt and pt daughter regarding bed available today.  CSW notified MD.   CSW to facilitate pt discharge to [facility] today.  Alison Murray, MSW, [place] Work [phone]

## 2015-06-02 LAB — CULTURE, BLOOD (ROUTINE X 2)
CULTURE: NO GROWTH
Culture: NO GROWTH

## 2015-06-05 ENCOUNTER — Ambulatory Visit: Payer: Medicare Other | Admitting: Oncology

## 2015-06-05 ENCOUNTER — Other Ambulatory Visit: Payer: Medicare Other

## 2015-06-06 NOTE — Addendum Note (Signed)
Encounter addended by: Consuella Lose, MD on: 06/06/2015  7:04 PM<BR>     Documentation filed: Notes Section

## 2015-06-06 NOTE — Op Note (Signed)
Name: [name]    MRN: [id number]   Date: 05/11/2015    DOB: 08-May-1924   STEREOTACTIC RADIOSURGERY OPERATIVE NOTE  PRE-OPERATIVE DIAGNOSIS:  Metastatic adenocarcinoma   POST-OPERATIVE DIAGNOSIS:  Same  PROCEDURE:  Stereotactic Radiosurgery  SURGEON:  Consuella Lose, MD  RADIATION ONCOLOGIST: Dr. Kyung Rudd, MD  TECHNIQUE:  The patient underwent a radiation treatment planning session in the radiation oncology simulation suite under the care of the radiation oncology physician and physicist.  I participated closely in the radiation treatment planning afterwards. The patient underwent planning CT which was fused to 3T high resolution MRI with 1 mm axial slices.  These images were fused on the planning system.  We contoured the gross target volumes and subsequently expanded this to yield the Planning Target Volume. I actively participated in the planning process.  I helped to define and review the target contours and also the contours of the optic pathway, eyes, brainstem and selected nearby organs at risk.  All the dose constraints for critical structures were reviewed and compared to AAPM Task Group 101.  The prescription dose conformity was reviewed.  I approved the plan electronically.    Accordingly, [name]  was brought to the TrueBeam stereotactic radiation treatment linac and placed in the custom immobilization mask.  The patient was aligned according to the IR fiducial markers with BrainLab Exactrac, then orthogonal x-rays were used in ExacTrac with the 6DOF robotic table and the shifts were made to align the patient  [name] received stereotactic radiosurgery to a prescription dose of 20Gy to the right frontal lesion uneventfully.    The detailed description of the procedure is recorded in the radiation oncology procedure note.  I was present for the duration of the procedure.  DISPOSITION:   Following delivery, the patient was transported to nursing in  stable condition and monitored for possible acute effects to be discharged to home in stable condition with follow-up in one month.  Consuella Lose, MD [facility]

## 2015-06-08 ENCOUNTER — Ambulatory Visit: Payer: Medicare Other | Admitting: Nurse Practitioner

## 2015-06-20 ENCOUNTER — Other Ambulatory Visit: Payer: Self-pay | Admitting: *Deleted

## 2015-06-25 ENCOUNTER — Ambulatory Visit: Payer: Medicare Other | Admitting: Oncology

## 2015-06-25 ENCOUNTER — Other Ambulatory Visit: Payer: Medicare Other

## 2015-06-28 ENCOUNTER — Telehealth: Payer: Self-pay | Admitting: Oncology

## 2015-06-28 NOTE — Telephone Encounter (Signed)
Contacted and faxed [employer] regarding pick up of death certificate

## 2015-06-29 ENCOUNTER — Telehealth: Payer: Self-pay | Admitting: Oncology

## 2015-06-29 NOTE — Telephone Encounter (Signed)
Death certificate received in HIM. Patient status changed to deceased. °

## 2015-07-09 NOTE — Progress Notes (Signed)
  Radiation Oncology         [phone] ________________________________  Name: [name] MRN: [id number]  Date: 05/11/2015  DOB: Jul 19, 1924  End of Treatment Note  Diagnosis:      ICD-9-CM ICD-10-CM   1. Metastasis to brain [facility]) 198.3 C79.31         Indication for treatment:  palliative       Radiation treatment dates:   05/11/15  Site/dose:    PTV1 Rt Post Frontal Cortex 32mm target was treated using 4 Arcs to a prescription dose of 20 Gy. ExacTrac Snap verification was performed for each couch angle.  Narrative: The patient tolerated radiation treatment well.   There were no signs of acute toxicity after treatment.  Plan: The patient has completed radiation treatment. The patient will return to radiation oncology clinic for routine followup in one month. I advised the patient to call or return sooner if they have any questions or concerns related to their recovery or treatment. ________________________________  ------------------------------------------------  Jodelle Gross, MD, PhD

## 2015-07-09 NOTE — Addendum Note (Signed)
Encounter addended by: Kyung Rudd, MD on: 30-Jun-2015  2:00 PM<BR>     Documentation filed: Notes Section

## 2015-07-09 DEATH — deceased

## 2016-01-17 IMAGING — CT CT CHEST W/O CM
2 of 4 series · 15 of 36 positions shown, 18 images · non-contrast
Comparison: None.

CLINICAL DATA: Colon cancer and abdominal pain. Nausea and
vomiting.

EXAM:
CT CHEST WITHOUT CONTRAST
TECHNIQUE: Multidetector CT imaging of the chest was performed following the
standard protocol without IV contrast..

[Series 2: chest w/o st · axial · non-contrast · 0.67mm/px · z∈[-190,+96]mm · 12 of 69 slices shown, 15 images]
[im 6/69  mediastinal]
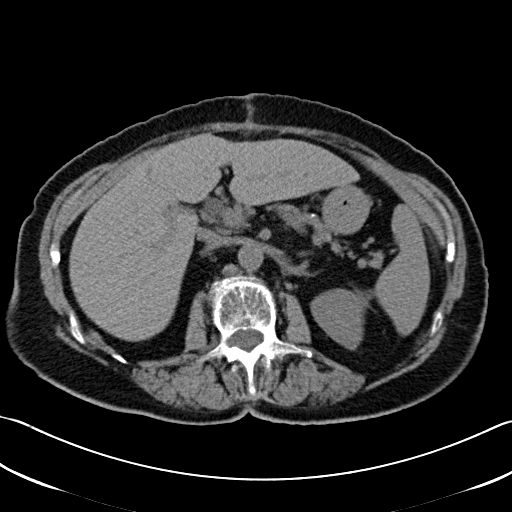
[im 6/69  lung]
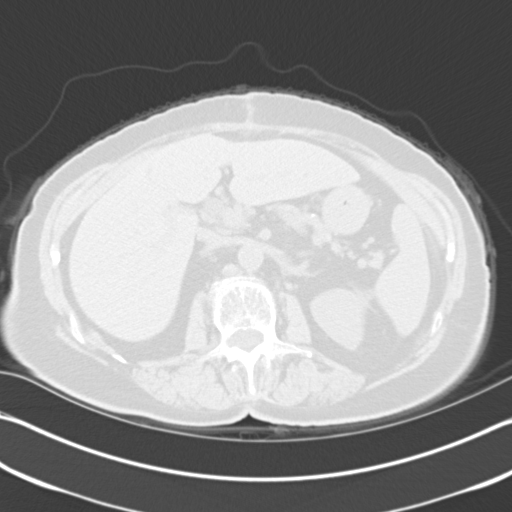
[im 11/69  lung]
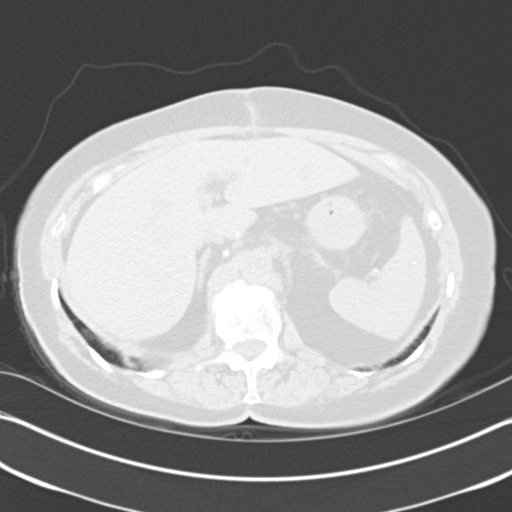
[im 16/69  lung]
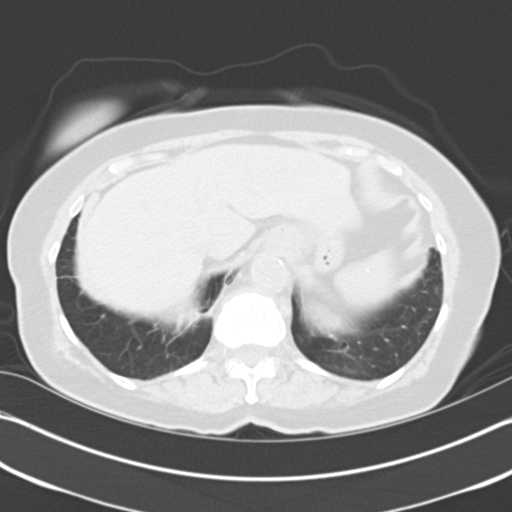
[im 21/69  lung]
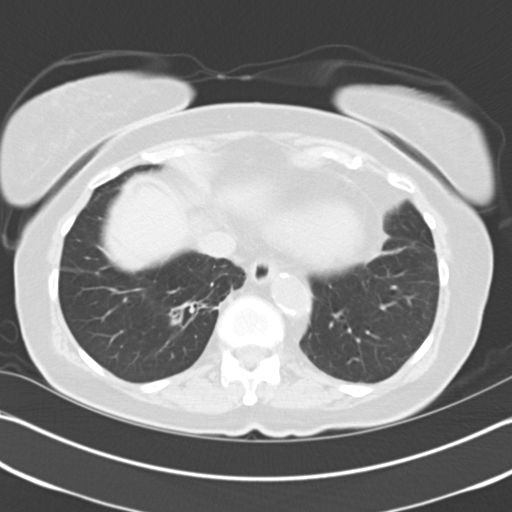
[im 27/69  mediastinal]
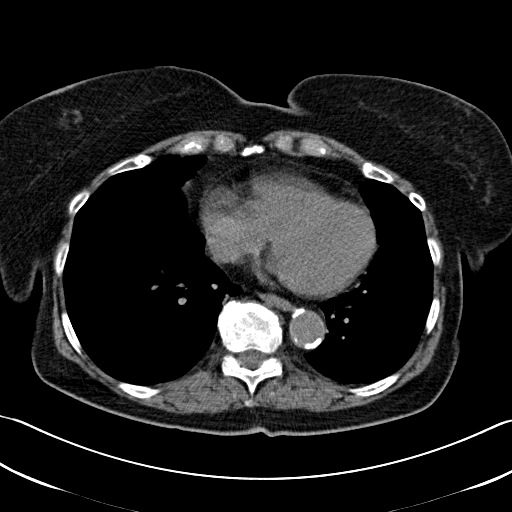
[im 27/69  lung]
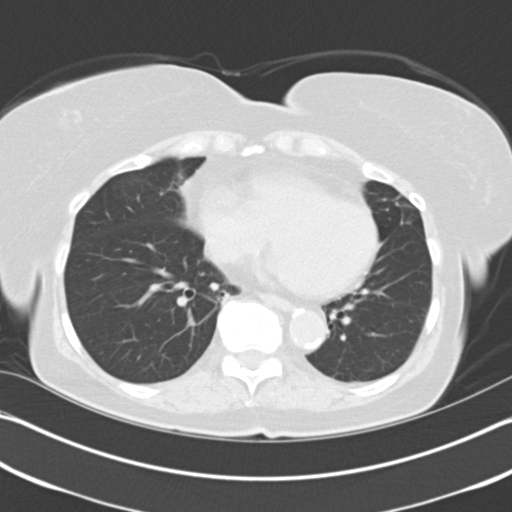
[im 32/69  lung]
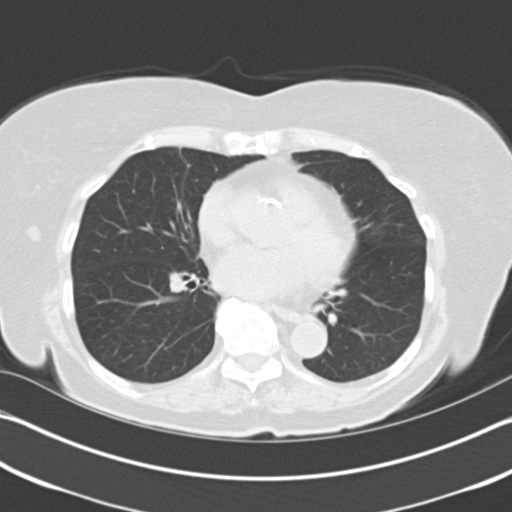
[im 37/69  lung]
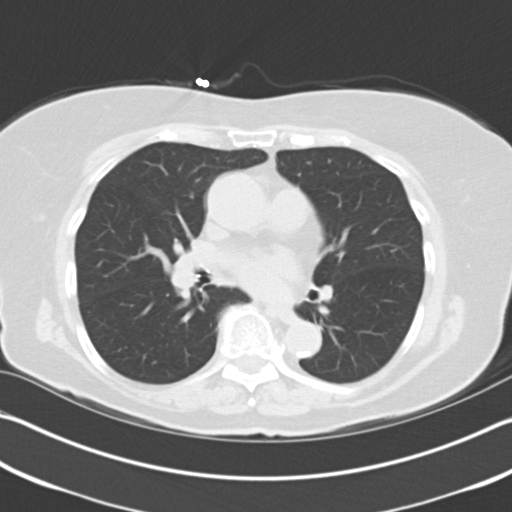
[im 42/69  lung]
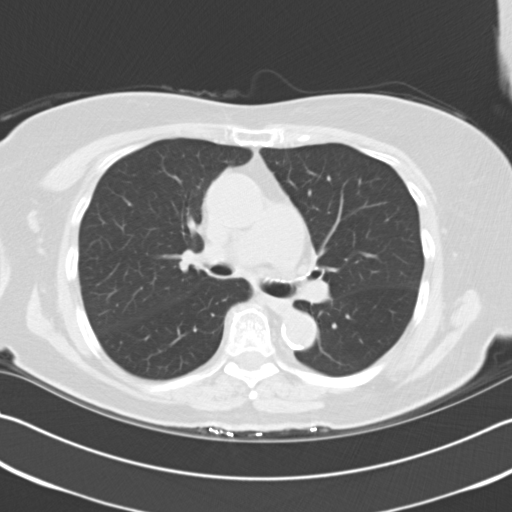
[im 48/69  mediastinal]
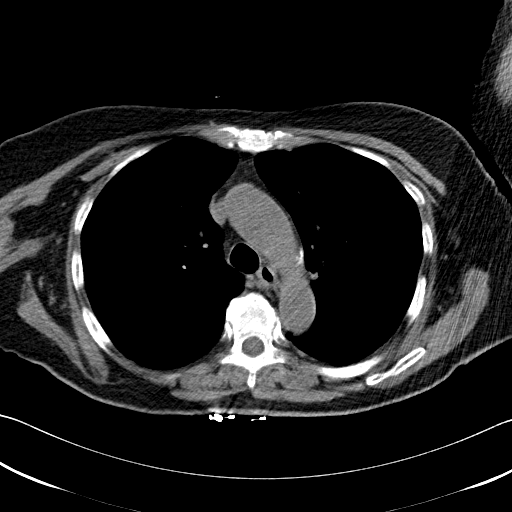
[im 48/69  lung]
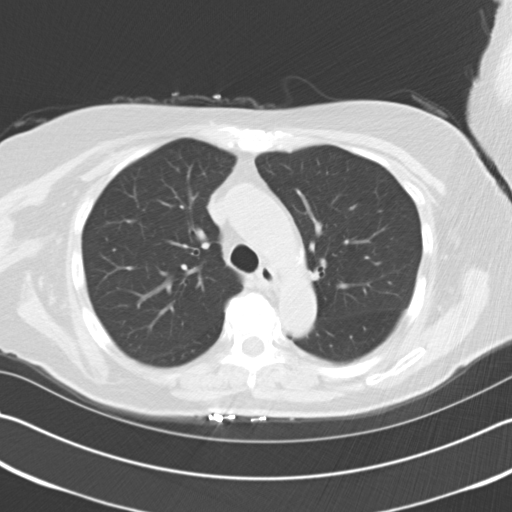
[im 53/69  lung]
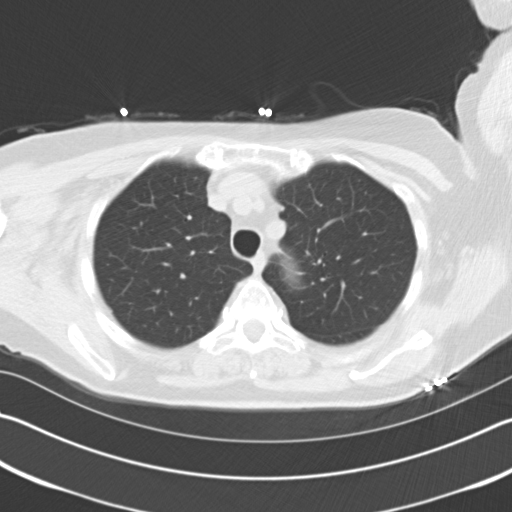
[im 58/69  lung]
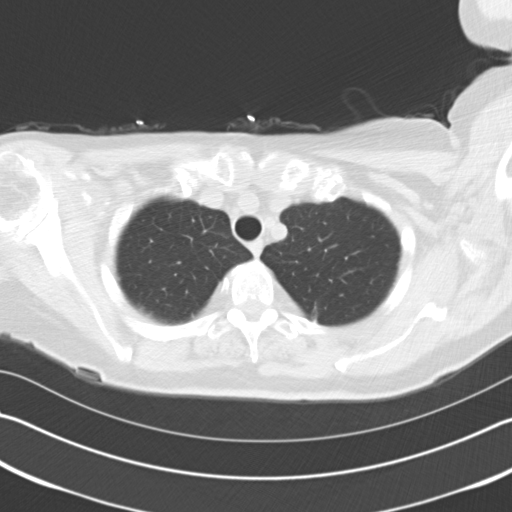
[im 63/69  lung]
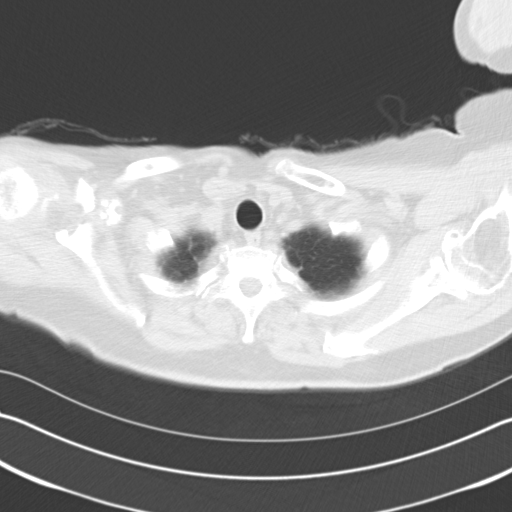

[Series 602: <mpr thick range> · coronal · 0.67mm/px · 3 of 76 slices shown]
[im 16/76  lung]
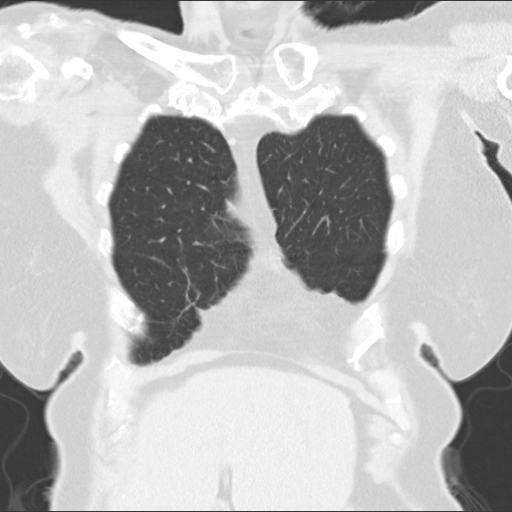
[im 31/76  lung]
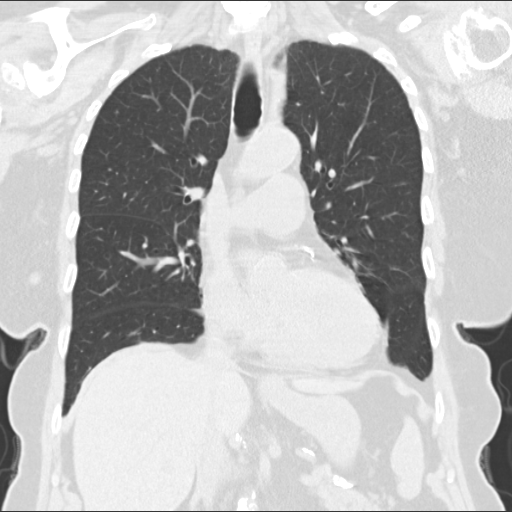
[im 46/76  lung]
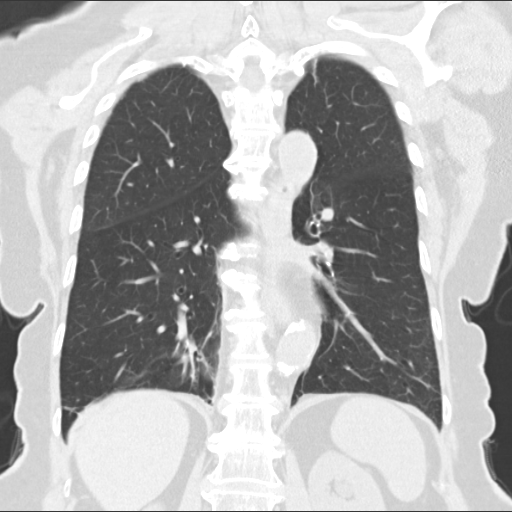

[15 of 36 positions shown; findings below may reference images not displayed]

FINDINGS: No pathologically enlarged mediastinal lymph nodes. Hilar regions
are difficult to definitively evaluate without IV contrast but
appear grossly unremarkable. No axillary adenopathy. Coronary artery
calcification. Heart size normal. No pericardial effusion.

Mild scattered scarring in the right middle lobe, lingula and both
lower lobes. Middle and right lower lobe nodules measure up to 4 mm
in the right lower lobe and are unchanged in the short interval from
07/06/2013. No pleural fluid. Airway is unremarkable.

Incidental imaging of the upper abdomen shows the visualized
portions of the liver, adrenal glands, kidneys, spleen, pancreas and
stomach to be grossly unremarkable. Retroperitoneal lymph nodes
measure up to 8 mm. No worrisome lytic or sclerotic lesions.
Degenerative changes are seen in the spine and shoulders, right
greater than left.
IMPRESSION: 1. Tiny right middle and right lower lobe nodules are nonspecific
and unchanged in the short interval from 07/06/2013. Continued
attention on followup exams is warranted, however.
2. Coronary artery calcification.
